# Patient Record
Sex: Female | Born: 1950 | Race: White | Hispanic: No | Smoking: Never smoker
Health system: Southern US, Community
[De-identification: ages and names within clinical notes are randomized; demographics above are authoritative.]

## PROBLEM LIST (undated history)

## (undated) DIAGNOSIS — I639 Cerebral infarction, unspecified: Secondary | ICD-10-CM

## (undated) DIAGNOSIS — E785 Hyperlipidemia, unspecified: Secondary | ICD-10-CM

## (undated) DIAGNOSIS — R7303 Prediabetes: Secondary | ICD-10-CM

---

## 2020-06-13 ENCOUNTER — Encounter (HOSPITAL_COMMUNITY): Payer: Self-pay | Admitting: Physician Assistant

## 2020-06-13 ENCOUNTER — Emergency Department (HOSPITAL_COMMUNITY): Payer: Medicare HMO

## 2020-06-13 ENCOUNTER — Other Ambulatory Visit: Payer: Self-pay

## 2020-06-13 ENCOUNTER — Inpatient Hospital Stay (HOSPITAL_COMMUNITY)
Admission: EM | Admit: 2020-06-13 | Discharge: 2020-06-19 | DRG: 071 | Disposition: A | Discharge status: Skilled Nursing Facility | Payer: Medicare HMO | Attending: Family Medicine | Admitting: Family Medicine

## 2020-06-13 DIAGNOSIS — Z20822 Contact with and (suspected) exposure to covid-19: Secondary | ICD-10-CM | POA: Diagnosis present

## 2020-06-13 DIAGNOSIS — R4182 Altered mental status, unspecified: Secondary | ICD-10-CM | POA: Diagnosis present

## 2020-06-13 DIAGNOSIS — G8929 Other chronic pain: Secondary | ICD-10-CM | POA: Diagnosis present

## 2020-06-13 DIAGNOSIS — W19XXXA Unspecified fall, initial encounter: Secondary | ICD-10-CM | POA: Diagnosis present

## 2020-06-13 DIAGNOSIS — I498 Other specified cardiac arrhythmias: Secondary | ICD-10-CM | POA: Diagnosis not present

## 2020-06-13 DIAGNOSIS — I472 Ventricular tachycardia: Secondary | ICD-10-CM | POA: Diagnosis present

## 2020-06-13 DIAGNOSIS — R739 Hyperglycemia, unspecified: Secondary | ICD-10-CM | POA: Diagnosis present

## 2020-06-13 DIAGNOSIS — Z8673 Personal history of transient ischemic attack (TIA), and cerebral infarction without residual deficits: Secondary | ICD-10-CM | POA: Diagnosis not present

## 2020-06-13 DIAGNOSIS — G928 Other toxic encephalopathy: Secondary | ICD-10-CM | POA: Diagnosis present

## 2020-06-13 DIAGNOSIS — W06XXXA Fall from bed, initial encounter: Secondary | ICD-10-CM | POA: Diagnosis present

## 2020-06-13 DIAGNOSIS — H5340 Unspecified visual field defects: Secondary | ICD-10-CM | POA: Diagnosis present

## 2020-06-13 DIAGNOSIS — R7303 Prediabetes: Secondary | ICD-10-CM | POA: Diagnosis present

## 2020-06-13 DIAGNOSIS — E8809 Other disorders of plasma-protein metabolism, not elsewhere classified: Secondary | ICD-10-CM | POA: Diagnosis present

## 2020-06-13 DIAGNOSIS — I63411 Cerebral infarction due to embolism of right middle cerebral artery: Secondary | ICD-10-CM | POA: Diagnosis not present

## 2020-06-13 DIAGNOSIS — R296 Repeated falls: Secondary | ICD-10-CM | POA: Diagnosis present

## 2020-06-13 DIAGNOSIS — R4701 Aphasia: Secondary | ICD-10-CM | POA: Diagnosis present

## 2020-06-13 DIAGNOSIS — G9341 Metabolic encephalopathy: Principal | ICD-10-CM | POA: Diagnosis present

## 2020-06-13 DIAGNOSIS — Z880 Allergy status to penicillin: Secondary | ICD-10-CM

## 2020-06-13 DIAGNOSIS — D696 Thrombocytopenia, unspecified: Secondary | ICD-10-CM | POA: Diagnosis present

## 2020-06-13 DIAGNOSIS — I639 Cerebral infarction, unspecified: Secondary | ICD-10-CM | POA: Diagnosis present

## 2020-06-13 DIAGNOSIS — Z882 Allergy status to sulfonamides status: Secondary | ICD-10-CM

## 2020-06-13 DIAGNOSIS — R410 Disorientation, unspecified: Secondary | ICD-10-CM

## 2020-06-13 DIAGNOSIS — I10 Essential (primary) hypertension: Secondary | ICD-10-CM | POA: Diagnosis present

## 2020-06-13 DIAGNOSIS — R2242 Localized swelling, mass and lump, left lower limb: Secondary | ICD-10-CM | POA: Diagnosis not present

## 2020-06-13 DIAGNOSIS — Z7982 Long term (current) use of aspirin: Secondary | ICD-10-CM | POA: Diagnosis not present

## 2020-06-13 DIAGNOSIS — I4892 Unspecified atrial flutter: Secondary | ICD-10-CM | POA: Diagnosis present

## 2020-06-13 DIAGNOSIS — M79605 Pain in left leg: Secondary | ICD-10-CM | POA: Diagnosis not present

## 2020-06-13 DIAGNOSIS — I248 Other forms of acute ischemic heart disease: Secondary | ICD-10-CM | POA: Diagnosis present

## 2020-06-13 DIAGNOSIS — E785 Hyperlipidemia, unspecified: Secondary | ICD-10-CM | POA: Diagnosis present

## 2020-06-13 DIAGNOSIS — N390 Urinary tract infection, site not specified: Secondary | ICD-10-CM

## 2020-06-13 HISTORY — DX: Prediabetes: R73.03

## 2020-06-13 HISTORY — DX: Cerebral infarction, unspecified: I63.9

## 2020-06-13 HISTORY — DX: Hyperlipidemia, unspecified: E78.5

## 2020-06-13 LAB — CBC WITH DIFFERENTIAL/PLATELET
Abs Immature Granulocytes: 0.06 10*3/uL (ref 0.00–0.07)
Basophils Absolute: 0 10*3/uL (ref 0.0–0.1)
Basophils Relative: 0 %
Eosinophils Absolute: 0 10*3/uL (ref 0.0–0.5)
Eosinophils Relative: 0 %
HCT: 38.5 % (ref 36.0–46.0)
Hemoglobin: 12 g/dL (ref 12.0–15.0)
Immature Granulocytes: 1 %
Lymphocytes Relative: 6 %
Lymphs Abs: 0.7 10*3/uL (ref 0.7–4.0)
MCH: 27.6 pg (ref 26.0–34.0)
MCHC: 31.2 g/dL (ref 30.0–36.0)
MCV: 88.7 fL (ref 80.0–100.0)
Monocytes Absolute: 0.6 10*3/uL (ref 0.1–1.0)
Monocytes Relative: 5 %
Neutro Abs: 10.4 10*3/uL — ABNORMAL HIGH (ref 1.7–7.7)
Neutrophils Relative %: 88 %
Platelets: 49 10*3/uL — ABNORMAL LOW (ref 150–400)
RBC: 4.34 MIL/uL (ref 3.87–5.11)
RDW: 14.1 % (ref 11.5–15.5)
WBC: 11.8 10*3/uL — ABNORMAL HIGH (ref 4.0–10.5)
nRBC: 0 % (ref 0.0–0.2)

## 2020-06-13 LAB — I-STAT CHEM 8, ED
BUN: 25 mg/dL — ABNORMAL HIGH (ref 8–23)
Calcium, Ion: 1.02 mmol/L — ABNORMAL LOW (ref 1.15–1.40)
Chloride: 111 mmol/L (ref 98–111)
Creatinine, Ser: 0.8 mg/dL (ref 0.44–1.00)
Glucose, Bld: 138 mg/dL — ABNORMAL HIGH (ref 70–99)
HCT: 33 % — ABNORMAL LOW (ref 36.0–46.0)
Hemoglobin: 11.2 g/dL — ABNORMAL LOW (ref 12.0–15.0)
Potassium: 4.1 mmol/L (ref 3.5–5.1)
Sodium: 140 mmol/L (ref 135–145)
TCO2: 20 mmol/L — ABNORMAL LOW (ref 22–32)

## 2020-06-13 LAB — ETHANOL: Alcohol, Ethyl (B): <10 mg/dL (ref <10)

## 2020-06-13 LAB — URINALYSIS, ROUTINE W REFLEX MICROSCOPIC
Bilirubin Urine: NEGATIVE
Glucose, UA: NEGATIVE mg/dL
Ketones, ur: 5 mg/dL — AB
Nitrite: NEGATIVE
Protein, ur: 100 mg/dL — AB
Specific Gravity, Urine: 1.017 (ref 1.005–1.030)
WBC, UA: >50 WBC/hpf — ABNORMAL HIGH (ref 0–5)
pH: 5 (ref 5.0–8.0)

## 2020-06-13 LAB — COMPREHENSIVE METABOLIC PANEL
ALT: 15 U/L (ref 0–44)
AST: 24 U/L (ref 15–41)
Albumin: 3.4 g/dL — ABNORMAL LOW (ref 3.5–5.0)
Alkaline Phosphatase: 49 U/L (ref 38–126)
Anion gap: 11 (ref 5–15)
BUN: 21 mg/dL (ref 8–23)
CO2: 20 mmol/L — ABNORMAL LOW (ref 22–32)
Calcium: 8.8 mg/dL — ABNORMAL LOW (ref 8.9–10.3)
Chloride: 105 mmol/L (ref 98–111)
Creatinine, Ser: 1.09 mg/dL — ABNORMAL HIGH (ref 0.44–1.00)
GFR, Estimated: 55 mL/min — ABNORMAL LOW (ref >60)
Glucose, Bld: 158 mg/dL — ABNORMAL HIGH (ref 70–99)
Potassium: 4.2 mmol/L (ref 3.5–5.1)
Sodium: 136 mmol/L (ref 135–145)
Total Bilirubin: 1.2 mg/dL (ref 0.3–1.2)
Total Protein: 6.7 g/dL (ref 6.5–8.1)

## 2020-06-13 LAB — LACTIC ACID, PLASMA: Lactic Acid, Venous: 1.3 mmol/L (ref 0.5–1.9)

## 2020-06-13 LAB — RAPID URINE DRUG SCREEN, HOSP PERFORMED
Amphetamines: NOT DETECTED
Barbiturates: NOT DETECTED
Benzodiazepines: NOT DETECTED
Cocaine: NOT DETECTED
Opiates: NOT DETECTED
Tetrahydrocannabinol: NOT DETECTED

## 2020-06-13 LAB — TROPONIN I (HIGH SENSITIVITY)
Troponin I (High Sensitivity): 21 ng/L — ABNORMAL HIGH (ref <18)
Troponin I (High Sensitivity): 21 ng/L — ABNORMAL HIGH (ref <18)

## 2020-06-13 LAB — SALICYLATE LEVEL: Salicylate Lvl: <7 mg/dL — ABNORMAL LOW (ref 7.0–30.0)

## 2020-06-13 LAB — PROTIME-INR
INR: 1 (ref 0.8–1.2)
Prothrombin Time: 13.2 seconds (ref 11.4–15.2)

## 2020-06-13 LAB — AMMONIA: Ammonia: 40 umol/L — ABNORMAL HIGH (ref 9–35)

## 2020-06-13 LAB — ACETAMINOPHEN LEVEL: Acetaminophen (Tylenol), Serum: <10 ug/mL — ABNORMAL LOW (ref 10–30)

## 2020-06-13 LAB — RESP PANEL BY RT-PCR (FLU A&B, COVID) ARPGX2
Influenza A by PCR: NEGATIVE
Influenza B by PCR: NEGATIVE
SARS Coronavirus 2 by RT PCR: NEGATIVE

## 2020-06-13 LAB — CBG MONITORING, ED: Glucose-Capillary: 164 mg/dL — ABNORMAL HIGH (ref 70–99)

## 2020-06-13 MED ORDER — SODIUM CHLORIDE 0.9 % IV SOLN: Freq: Once | INTRAVENOUS | Status: AC

## 2020-06-13 MED ORDER — ENOXAPARIN SODIUM 40 MG/0.4ML ~~LOC~~ SOLN: 40.0000 mg | SUBCUTANEOUS | Status: DC

## 2020-06-13 MED ORDER — ONDANSETRON HCL 4 MG PO TABS: 4.0000 mg | ORAL_TABLET | Freq: Four times a day (QID) | ORAL | Status: DC | PRN

## 2020-06-13 MED ORDER — FOSFOMYCIN TROMETHAMINE 3 G PO PACK
3.0000 g | PACK | Freq: Once | ORAL | Status: AC
Start: 2020-06-13 — End: 2020-06-13
  Administered 2020-06-13: 3 g via ORAL
  Filled 2020-06-13: qty 3

## 2020-06-13 MED ORDER — SODIUM CHLORIDE 0.9 % IV SOLN: INTRAVENOUS | Status: DC

## 2020-06-13 MED ORDER — ACETAMINOPHEN 650 MG RE SUPP: 650.0000 mg | Freq: Four times a day (QID) | RECTAL | Status: DC | PRN

## 2020-06-13 MED ORDER — ACETAMINOPHEN 325 MG PO TABS
650.0000 mg | ORAL_TABLET | Freq: Four times a day (QID) | ORAL | Status: DC | PRN
  Administered 2020-06-14 – 2020-06-19 (×4): 650 mg via ORAL
  Filled 2020-06-13 (×4): qty 2

## 2020-06-13 MED ORDER — ONDANSETRON HCL 4 MG/2ML IJ SOLN: 4.0000 mg | Freq: Four times a day (QID) | INTRAMUSCULAR | Status: DC | PRN

## 2020-06-13 NOTE — ED Notes (Signed)
Patient off the floor, will obtain VS when patient returns.

## 2020-06-13 NOTE — ED Provider Notes (Signed)
[facility] EMERGENCY [facility]MENT Provider Note   CSN: [id number] Arrival date & time: 06/13/20  1408     History Chief Complaint  Patient presents with  . Altered Mental Status    Kristy GandyVirginia Jensen is a 70 y.o. female with unknown past medical history who presents today for evaluation of altered mental status.  There are no outside records for review.  History is primarily obtained from patient's and her son.  Patient's son reports that they moved patient from [place] here.  He notes that over the past few days she has had worsening mobility and increased confusion.  He states that she did have a stroke in 2019.  He states that she fell Thursday morning at 3 or 4 AM and then yesterday reportedly fell out of bed.  He states that over the past few days she has been more confused than usual.  She has also been soiling herself which is not her baseline.  She lives in independent living.  Her primary care doctor is still in [place] and they have an appointment for a new one locally in April.  Primary care doctor in [place] is Dr. Marnee GuarneriBerkovich.  Patient's son thinks that she takes aspirin and cholestyramine after having her gall bladder removed.  He doesn't know any other medical history.    Level 5 caveat for confusion.   HPI     Past Medical History:  Diagnosis Date  . Stroke [facility](HCC)     Patient Active Problem List   Diagnosis Date Noted  . Metabolic encephalopathy 06/13/2020  . CVA (cerebral vascular accident) (HCC) 06/13/2020  . Acute lower UTI 06/13/2020  . Hyperglycemia 06/13/2020    History reviewed. No pertinent surgical history.   OB History   No obstetric history on file.     History reviewed. No pertinent family history.     Home Medications Prior to Admission medications   Not on File    Allergies    Penicillins, Sulfa antibiotics, and Tetanus toxoids  Review of Systems   Review of Systems  Unable to perform ROS: Mental status change     Physical Exam Updated Vital Signs BP (!) 165/82   Pulse 88   Temp (!) 97.5 F (36.4 C) (Oral)   Resp (!) 24   SpO2 100%   Physical Exam Vitals and nursing note reviewed.  Constitutional:      General: She is not in acute distress.    Appearance: She is not diaphoretic.  HENT:     Head: Normocephalic and atraumatic.  Eyes:     General: No scleral icterus.       Right eye: No discharge.        Left eye: No discharge.     Conjunctiva/sclera: Conjunctivae normal.  Cardiovascular:     Rate and Rhythm: Normal rate and regular rhythm.     Pulses: Normal pulses.     Heart sounds: Normal heart sounds.  Pulmonary:     Effort: Pulmonary effort is normal. No respiratory distress.     Breath sounds: No stridor.  Abdominal:     General: There is no distension.     Tenderness: There is no abdominal tenderness. There is no guarding.  Musculoskeletal:        General: No deformity.     Cervical back: Normal range of motion and neck supple. No rigidity or tenderness.     Comments: Patient reports pain in her left knee.  She will not tolerate  attempts at range of motion.  Skin:    General: Skin is warm and dry.  Neurological:     Mental Status: She is alert.     Motor: No abnormal muscle tone.     Comments: Patient is awake.  She is oriented to person and place knowing that she is in the hospital however is not oriented to time.  She is able to move both her arms and legs spontaneously.  Her speech is not slurred.   She is confused and that she will state no I do not have any allergies and then when I mentioned that her son said she is allergic to penicillins and sulfa she is able to tell me that she is allergic to penicillins, sulfa, and tetanus toxoid.    Psychiatric:        Mood and Affect: Mood normal.        Behavior: Behavior normal.     ED Results / Procedures / Treatments   Labs (all labs ordered are listed, but only abnormal results are displayed) Labs Reviewed   COMPREHENSIVE METABOLIC PANEL - Abnormal; Notable for the following components:      Result Value   CO2 20 (*)    Glucose, Bld 158 (*)    Creatinine, Ser 1.09 (*)    Calcium 8.8 (*)    Albumin 3.4 (*)    GFR, Estimated 55 (*)    All other components within normal limits  CBC WITH DIFFERENTIAL/PLATELET - Abnormal; Notable for the following components:   WBC 11.8 (*)    Platelets 49 (*)    Neutro Abs 10.4 (*)    All other components within normal limits  URINALYSIS, ROUTINE W REFLEX MICROSCOPIC - Abnormal; Notable for the following components:   APPearance CLOUDY (*)    Hgb urine dipstick SMALL (*)    Ketones, ur 5 (*)    Protein, ur 100 (*)    Leukocytes,Ua LARGE (*)    WBC, UA >50 (*)    Bacteria, UA MANY (*)    All other components within normal limits  AMMONIA - Abnormal; Notable for the following components:   Ammonia 40 (*)    All other components within normal limits  ACETAMINOPHEN LEVEL - Abnormal; Notable for the following components:   Acetaminophen (Tylenol), Serum <10 (*)    All other components within normal limits  SALICYLATE LEVEL - Abnormal; Notable for the following components:   Salicylate Lvl <7.0 (*)    All other components within normal limits  I-STAT CHEM 8, ED - Abnormal; Notable for the following components:   BUN 25 (*)    Glucose, Bld 138 (*)    Calcium, Ion 1.02 (*)    TCO2 20 (*)    Hemoglobin 11.2 (*)    HCT 33.0 (*)    All other components within normal limits  CBG MONITORING, ED - Abnormal; Notable for the following components:   Glucose-Capillary 164 (*)    All other components within normal limits  TROPONIN I (HIGH SENSITIVITY) - Abnormal; Notable for the following components:   Troponin I (High Sensitivity) 21 (*)    All other components within normal limits  TROPONIN I (HIGH SENSITIVITY) - Abnormal; Notable for the following components:   Troponin I (High Sensitivity) 21 (*)    All other components within normal limits  RESP PANEL BY  RT-PCR (FLU A&B, COVID) ARPGX2  CULTURE, BLOOD (ROUTINE X 2)  CULTURE, BLOOD (ROUTINE X 2)  PROTIME-INR  ETHANOL  RAPID  URINE DRUG SCREEN, HOSP PERFORMED  LACTIC ACID, PLASMA  HIV ANTIBODY (ROUTINE TESTING W REFLEX)  CBC    EKG EKG Interpretation  Date/Time:  Friday June 13 2020 14:19:16 EST Ventricular Rate:  87 PR Interval:    QRS Duration: 82 QT Interval:  360 QTC Calculation: 433 R Axis:   -28 Text Interpretation: Atrial fibrillation vs motrion artifact Borderline left axis deviation Artifact in lead(s) I Confirmed by Alvester Chou [phone]) on 06/13/2020 2:42:56 PM   Radiology DG Chest 2 View  Result Date: 06/13/2020 CLINICAL DATA:  Status post fall. EXAM: CHEST - 2 VIEW COMPARISON:  None. FINDINGS: The heart size and mediastinal contours are within normal limits. No pneumothorax or pleural effusion is noted. Mild bibasilar subsegmental atelectasis or scarring is noted. The visualized skeletal structures are unremarkable. IMPRESSION: Mild bibasilar subsegmental atelectasis or scarring. Electronically Signed   By: Lupita Raider M.D.   On: 06/13/2020 16:42   CT Head Wo Contrast  Result Date: 06/13/2020 CLINICAL DATA:  Head trauma EXAM: CT HEAD WITHOUT CONTRAST CT CERVICAL SPINE WITHOUT CONTRAST TECHNIQUE: Multidetector CT imaging of the head and cervical spine was performed following the standard protocol without intravenous contrast. Multiplanar CT image reconstructions of the cervical spine were also generated. COMPARISON:  None. FINDINGS: CT HEAD FINDINGS Brain: Ventricle size normal. Mild atrophy. Chronic infarct left parietal lobe with encephalomalacia and mild calcification. Negative for acute infarct, hemorrhage, mass. Vascular: Negative for hyperdense vessel Skull: Negative Sinuses/Orbits: Paranasal sinuses clear.  Negative orbit Other: CT CERVICAL SPINE FINDINGS Alignment: Slight anterolisthesis C4-5.  Mild cervical kyphosis. Skull base and vertebrae: Negative for  fracture. Soft tissues and spinal canal: Hypoplastic left thyroid. 2 cm nodule right thyroid. No enlarged lymph nodes in the neck Disc levels: Disc degeneration and spurring C4 through C7. This is most severe at C5-6 with diffuse uncinate spurring as well as a calcified extruded disc fragment extending caudally. There is moderate spinal stenosis. Severe right foraminal encroachment and moderate left foraminal encroachment at C5-6. Upper chest: Lung apices clear bilaterally Other: None IMPRESSION: 1. No acute intracranial abnormality 2. Negative for cervical spine fracture 3. Cervical spine degenerative change with moderate spinal stenosis at C5-6. Electronically Signed   By: Marlan Palau M.D.   On: 06/13/2020 16:01   CT Cervical Spine Wo Contrast  Result Date: 06/13/2020 CLINICAL DATA:  Head trauma EXAM: CT HEAD WITHOUT CONTRAST CT CERVICAL SPINE WITHOUT CONTRAST TECHNIQUE: Multidetector CT imaging of the head and cervical spine was performed following the standard protocol without intravenous contrast. Multiplanar CT image reconstructions of the cervical spine were also generated. COMPARISON:  None. FINDINGS: CT HEAD FINDINGS Brain: Ventricle size normal. Mild atrophy. Chronic infarct left parietal lobe with encephalomalacia and mild calcification. Negative for acute infarct, hemorrhage, mass. Vascular: Negative for hyperdense vessel Skull: Negative Sinuses/Orbits: Paranasal sinuses clear.  Negative orbit Other: CT CERVICAL SPINE FINDINGS Alignment: Slight anterolisthesis C4-5.  Mild cervical kyphosis. Skull base and vertebrae: Negative for fracture. Soft tissues and spinal canal: Hypoplastic left thyroid. 2 cm nodule right thyroid. No enlarged lymph nodes in the neck Disc levels: Disc degeneration and spurring C4 through C7. This is most severe at C5-6 with diffuse uncinate spurring as well as a calcified extruded disc fragment extending caudally. There is moderate spinal stenosis. Severe right foraminal  encroachment and moderate left foraminal encroachment at C5-6. Upper chest: Lung apices clear bilaterally Other: None IMPRESSION: 1. No acute intracranial abnormality 2. Negative for cervical spine fracture 3. Cervical spine degenerative change with  moderate spinal stenosis at C5-6. Electronically Signed   By: Marlan Palau M.D.   On: 06/13/2020 16:01   DG Knee Complete 4 Views Left  Result Date: 06/13/2020 CLINICAL DATA:  Left knee pain after fall several days ago. EXAM: LEFT KNEE - COMPLETE 4+ VIEW COMPARISON:  None. FINDINGS: No evidence of fracture, dislocation, or joint effusion. Mild narrowing of medial and lateral joint spaces are noted with osteophyte formation. Severe narrowing of patellofemoral space is noted. Soft tissues are unremarkable. IMPRESSION: Tricompartmental degenerative joint disease. No acute abnormality seen in the left knee. Electronically Signed   By: Lupita Raider M.D.   On: 06/13/2020 16:43   DG Hip Unilat W or Wo Pelvis 2-3 Views Left  Result Date: 06/13/2020 CLINICAL DATA:  Acute left hip pain after fall several days ago. EXAM: DG HIP (WITH OR WITHOUT PELVIS) 2-3V LEFT COMPARISON:  None. FINDINGS: There is no evidence of hip fracture or dislocation. There is no evidence of arthropathy or other focal bone abnormality. IMPRESSION: Negative. Electronically Signed   By: Lupita Raider M.D.   On: 06/13/2020 16:44    Procedures Procedures   Medications Ordered in ED Medications  0.9 %  sodium chloride infusion ( Intravenous New Bag/Given 06/13/20 2030)  ondansetron (ZOFRAN) tablet 4 mg (has no administration in time range)    Or  ondansetron (ZOFRAN) injection 4 mg (has no administration in time range)  acetaminophen (TYLENOL) tablet 650 mg (has no administration in time range)    Or  acetaminophen (TYLENOL) suppository 650 mg (has no administration in time range)  fosfomycin (MONUROL) packet 3 g (3 g Oral Given 06/13/20 2014)  0.9 %  sodium chloride infusion (  Intravenous New Bag/Given 06/13/20 2018)    ED Course  I have reviewed the triage vital signs and the nursing notes.  Pertinent labs & imaging results that were available during my care of the patient were reviewed by me and considered in my medical decision making (see chart for details).  Clinical Course as of 06/13/20 2111  Fri Jun 13, 2020  1434 Called CT to expedite scans [EH]  1438 Son:  Sorci [EH]  1761 ciearra rufo [phone] [EH]  1454 Stroke in 2019, cognitive impairment, Moved in October. Fell Thursday at 3/4am and yesterday fell out of bed. [place] Has been soiling her self,  [EH]  1459 Aspirin, Fartiga?  Berkovich  [phone] [EH]  1757 Urine [EH]    Clinical Course User Index [EH] Norman Clay   MDM Rules/Calculators/A&P                         Patient is a 70 year old woman who presents today for evaluation of confusion for the past few days. Story is limited as there are no outside records available for review, patient does not know the medicines that she is taking for her history as she is confused and her son does not know these either.  Does report though that over the past few days her mental status has been worsening and she has been soiling herself which is abnormal for her.  Patient has been reportedly falling twice yesterday.  She is supposed to be taking Eliquis, tells me she has not taken it recently.  She had CT head and neck obtained without evidence of intracranial hemorrhage or other acute.  X-rays of the knee was obtained showing arthritis and son reports that her knee pain is chronic.  Hip x-rays were also obtained without evidence of fracture or other acute abnormality.  CBC shows mild leukocytosis at 11.8, mild thrombocytopenia with platelets of 49.  CMP shows glucose is slightly elevated at 158, creatinine is 1.09.  Her ammonia is slightly elevated.  She does take cholestyramine according to her son which he attributes to her  taking for having her gallbladder out.  Her urine does show 11-20 red cells, over 50 white cells with many bacteria.  Patient has a unknown reaction to penicillins and sulfa drugs.  I spoke with pharmacy who recommended either fosfomycin or Rocephin.  Given that I do not know what patient's allergic reaction was to antibiotics or when it was decision made for fosfomycin.  As patient lives in independent living and is confused and unable to care for herself at this point she will require admission into the hospital.  I spoke with Hospitalist who will see patient for admission.    Final Clinical Impression(s) / ED Diagnoses Final diagnoses:  Confusion  Lower urinary tract infectious disease    Rx / DC Orders ED Discharge Orders    None       Norman Clay 06/13/20 2111    Terald Sleeper, MD 06/14/20 [phone]

## 2020-06-13 NOTE — ED Notes (Signed)
ED PA requesting pt to get CT scan as soon as possible per order. This nurse notified CT of provider request.

## 2020-06-13 NOTE — ED Notes (Signed)
This RN notified ED pharmacy to contact pt son to update pt Med rec

## 2020-06-13 NOTE — H&P (Signed)
History and Physical   [place] YQI:[id number] DOB: Jul 31, 1950 DOA: 06/13/2020  Referring MD/NP/PA: Lyndel Safe, PA  PCP: Patient, No Pcp Per   Outpatient Specialists: None  Patient coming from: Independent living facility  Chief Complaint: Altered mental status  HPI: [place] is a 70 y.o. female with medical history significant of CVA but otherwise no significant other known history who lives independently per family brought in secondary to altered mental status.  Patient's son is primary historian.  She apparently moved from [place] recently.  He moved her here because of increasing confusion.  He has no clear history of patient's medical problems and is not aware of the medications she takes.  Patient is able to say that she had a stroke in 2019.  She fell a few days ago and yesterday but reportedly fell again this time out of bed.  She has also been swelling herself.  Patient has no local primary caregiver.  She is therefore not able to give [place] adequate history regarding her current medical situation.  Patient being admitted to the hospital for further evaluation and treatment.  Father son she has also decreased oral intake.  Patient evaluated in the ER and was found to have UTI.  Suspicion for toxic metabolic encephalopathy therefore is made.  She is being admitted to the hospital for evaluation and treatment.  No prior history of dementia.  No head injuries..  ED Course: Temperature 98.1 blood pressure 165/82 pulse 80 respirate 26 oxygen sats 96% on room air.  White count is 11.8 hemoglobin 11.1 platelets of 49.  Sodium 140 potassium 4.0 chloride 111 CO2 20 BUN 25 creatinine 0.80 and calcium 8.8.  Glucose 131 INR 1.0 lactic acid 1.3.  Head CT without contrast is negative.  COVID-19 screen is negative.  Troponin is 21 and flat.  CT C-spine is also negative.  Chest x-ray and hip x-rays are at this point negative.  Patient is therefore being admitted based on urinalysis which  shows WBC more than 50 many bacteria RBC 11-20 and large leukocytes.  It is also cloudy urine.  Appears to have UTI.  Review of Systems: As per HPI otherwise 10 point review of systems negative.    Past Medical History:  Diagnosis Date  . Stroke [facility])     History reviewed. No pertinent surgical history.   has no history on file for tobacco use, alcohol use, and drug use.  Allergies  Allergen Reactions  . Penicillins   . Sulfa Antibiotics   . Tetanus Toxoids     History reviewed. No pertinent family history.   Prior to Admission medications   Not on File    Physical Exam: Vitals:   06/13/20 1500 06/13/20 1530 06/13/20 2206 06/14/20 0034  BP: (!) 145/79 (!) 165/82 129/65 124/74  Pulse: 87 88 87 82  Resp: 19 (!) 24 (!) 21 (!) 21  Temp:    98.1 F (36.7 C)  TempSrc:    Oral  SpO2: 98% 100% 100% 97%      Constitutional: Mildly confused no distress Vitals:   06/13/20 1500 06/13/20 1530 06/13/20 2206 06/14/20 0034  BP: (!) 145/79 (!) 165/82 129/65 124/74  Pulse: 87 88 87 82  Resp: 19 (!) 24 (!) 21 (!) 21  Temp:    98.1 F (36.7 C)  TempSrc:    Oral  SpO2: 98% 100% 100% 97%   Eyes: PERRL, lids and conjunctivae normal ENMT: Mucous membranes are moist. Posterior pharynx clear of any exudate or  lesions.Normal dentition.  Neck: normal, supple, no masses, no thyromegaly Respiratory: clear to auscultation bilaterally, no wheezing, no crackles. Normal respiratory effort. No accessory muscle use.  Cardiovascular: Regular rate and rhythm, no murmurs / rubs / gallops. No extremity edema. 2+ pedal pulses. No carotid bruits.  Abdomen: no tenderness, no masses palpated. No hepatosplenomegaly. Bowel sounds positive.  Musculoskeletal: no clubbing / cyanosis. No joint deformity upper and lower extremities. Good ROM, no contractures. Normal muscle tone.  Skin: no rashes, lesions, ulcers. No induration Neurologic: CN 2-12 grossly intact. Sensation intact, DTR normal. Strength 5/5  in all 4.  Psychiatric: Confused but pleasant not agitated.     Labs on Admission: I have personally reviewed following labs and imaging studies  CBC: Recent Labs  Lab 06/13/20 1431 06/13/20 1506  WBC 11.8*  --   NEUTROABS 10.4*  --   HGB 12.0 11.2*  HCT 38.5 33.0*  MCV 88.7  --   PLT 49*  --    Basic Metabolic Panel: Recent Labs  Lab 06/13/20 1431 06/13/20 1506  NA 136 140  K 4.2 4.1  CL 105 111  CO2 20*  --   GLUCOSE 158* 138*  BUN 21 25*  CREATININE 1.09* 0.80  CALCIUM 8.8*  --    GFR: CrCl cannot be calculated (Unknown ideal weight.). Liver Function Tests: Recent Labs  Lab 06/13/20 1431  AST 24  ALT 15  ALKPHOS 49  BILITOT 1.2  PROT 6.7  ALBUMIN 3.4*   No results for input(s): LIPASE, AMYLASE in the last 168 hours. Recent Labs  Lab 06/13/20 1432  AMMONIA 40*   Coagulation Profile: Recent Labs  Lab 06/13/20 1431  INR 1.0   Cardiac Enzymes: No results for input(s): CKTOTAL, CKMB, CKMBINDEX, TROPONINI in the last 168 hours. BNP (last 3 results) No results for input(s): PROBNP in the last 8760 hours. HbA1C: No results for input(s): HGBA1C in the last 72 hours. CBG: Recent Labs  Lab 06/13/20 1459  GLUCAP 164*   Lipid Profile: No results for input(s): CHOL, HDL, LDLCALC, TRIG, CHOLHDL, LDLDIRECT in the last 72 hours. Thyroid Function Tests: No results for input(s): TSH, T4TOTAL, FREET4, T3FREE, THYROIDAB in the last 72 hours. Anemia Panel: No results for input(s): VITAMINB12, FOLATE, FERRITIN, TIBC, IRON, RETICCTPCT in the last 72 hours. Urine analysis:    Component Value Date/Time   COLORURINE YELLOW 06/13/2020 1618   APPEARANCEUR CLOUDY (A) 06/13/2020 1618   LABSPEC 1.017 06/13/2020 1618   PHURINE 5.0 06/13/2020 1618   GLUCOSEU NEGATIVE 06/13/2020 1618   HGBUR SMALL (A) 06/13/2020 1618   BILIRUBINUR NEGATIVE 06/13/2020 1618   KETONESUR 5 (A) 06/13/2020 1618   PROTEINUR 100 (A) 06/13/2020 1618   NITRITE NEGATIVE 06/13/2020 1618    LEUKOCYTESUR LARGE (A) 06/13/2020 1618   Sepsis Labs: @LABRCNTIP (procalcitonin:4,lacticidven:4) ) Recent Results (from the past 240 hour(s))  Resp Panel by RT-PCR (Flu A&B, Covid) Nasopharyngeal Swab     Status: None   Collection Time: 06/13/20  8:20 PM   Specimen: Nasopharyngeal Swab; Nasopharyngeal(NP) swabs in vial transport medium  Result Value Ref Range Status   SARS Coronavirus 2 by RT PCR NEGATIVE NEGATIVE Final    Comment: (NOTE) SARS-CoV-2 target nucleic acids are NOT DETECTED.  The SARS-CoV-2 RNA is generally detectable in upper respiratory specimens during the acute phase of infection. The lowest concentration of SARS-CoV-2 viral copies this assay can detect is 138 copies/mL. A negative result does not preclude SARS-Cov-2 infection and should not be used as the sole basis for treatment  or other patient management decisions. A negative result may occur with  improper specimen collection/handling, submission of specimen other than nasopharyngeal swab, presence of viral mutation(s) within the areas targeted by this assay, and inadequate number of viral copies(<138 copies/mL). A negative result must be combined with clinical observations, patient history, and epidemiological information. The expected result is Negative.  Fact Sheet for Patients:  BloggerCourse.com  Fact Sheet for Healthcare Providers:  SeriousBroker.it  This test is no t yet approved or cleared by the [place] FDA and  has been authorized for detection and/or diagnosis of SARS-CoV-2 by FDA under an Emergency Use Authorization (EUA). This EUA will remain  in effect (meaning this test can be used) for the duration of the COVID-19 declaration under Section 564(b)(1) of the Act, 21 U.S.C.section 360bbb-3(b)(1), unless the authorization is terminated  or revoked sooner.       Influenza A by PCR NEGATIVE NEGATIVE Final   Influenza B by PCR NEGATIVE  NEGATIVE Final    Comment: (NOTE) The Xpert Xpress SARS-CoV-2/FLU/RSV plus assay is intended as an aid in the diagnosis of influenza from Nasopharyngeal swab specimens and should not be used as a sole basis for treatment. Nasal washings and aspirates are unacceptable for Xpert Xpress SARS-CoV-2/FLU/RSV testing.  Fact Sheet for Patients: BloggerCourse.com  Fact Sheet for Healthcare Providers: SeriousBroker.it  This test is not yet approved or cleared by the [place] FDA and has been authorized for detection and/or diagnosis of SARS-CoV-2 by FDA under an Emergency Use Authorization (EUA). This EUA will remain in effect (meaning this test can be used) for the duration of the COVID-19 declaration under Section 564(b)(1) of the Act, 21 U.S.C. section 360bbb-3(b)(1), unless the authorization is terminated or revoked.  Performed at [facility], [address], [place], [place] [address]      Radiological Exams on Admission: DG Chest 2 View  Result Date: 06/13/2020 CLINICAL DATA:  Status post fall. EXAM: CHEST - 2 VIEW COMPARISON:  None. FINDINGS: The heart size and mediastinal contours are within normal limits. No pneumothorax or pleural effusion is noted. Mild bibasilar subsegmental atelectasis or scarring is noted. The visualized skeletal structures are unremarkable. IMPRESSION: Mild bibasilar subsegmental atelectasis or scarring. Electronically Signed   By: Lupita Raider M.D.   On: 06/13/2020 16:42   CT Head Wo Contrast  Result Date: 06/13/2020 CLINICAL DATA:  Head trauma EXAM: CT HEAD WITHOUT CONTRAST CT CERVICAL SPINE WITHOUT CONTRAST TECHNIQUE: Multidetector CT imaging of the head and cervical spine was performed following the standard protocol without intravenous contrast. Multiplanar CT image reconstructions of the cervical spine were also generated. COMPARISON:  None. FINDINGS: CT HEAD FINDINGS Brain: Ventricle size  normal. Mild atrophy. Chronic infarct left parietal lobe with encephalomalacia and mild calcification. Negative for acute infarct, hemorrhage, mass. Vascular: Negative for hyperdense vessel Skull: Negative Sinuses/Orbits: Paranasal sinuses clear.  Negative orbit Other: CT CERVICAL SPINE FINDINGS Alignment: Slight anterolisthesis C4-5.  Mild cervical kyphosis. Skull base and vertebrae: Negative for fracture. Soft tissues and spinal canal: Hypoplastic left thyroid. 2 cm nodule right thyroid. No enlarged lymph nodes in the neck Disc levels: Disc degeneration and spurring C4 through C7. This is most severe at C5-6 with diffuse uncinate spurring as well as a calcified extruded disc fragment extending caudally. There is moderate spinal stenosis. Severe right foraminal encroachment and moderate left foraminal encroachment at C5-6. Upper chest: Lung apices clear bilaterally Other: None IMPRESSION: 1. No acute intracranial abnormality 2. Negative for cervical spine fracture  3. Cervical spine degenerative change with moderate spinal stenosis at C5-6. Electronically Signed   By: Marlan Palauharles  Clark M.D.   On: 06/13/2020 16:01   CT Cervical Spine Wo Contrast  Result Date: 06/13/2020 CLINICAL DATA:  Head trauma EXAM: CT HEAD WITHOUT CONTRAST CT CERVICAL SPINE WITHOUT CONTRAST TECHNIQUE: Multidetector CT imaging of the head and cervical spine was performed following the standard protocol without intravenous contrast. Multiplanar CT image reconstructions of the cervical spine were also generated. COMPARISON:  None. FINDINGS: CT HEAD FINDINGS Brain: Ventricle size normal. Mild atrophy. Chronic infarct left parietal lobe with encephalomalacia and mild calcification. Negative for acute infarct, hemorrhage, mass. Vascular: Negative for hyperdense vessel Skull: Negative Sinuses/Orbits: Paranasal sinuses clear.  Negative orbit Other: CT CERVICAL SPINE FINDINGS Alignment: Slight anterolisthesis C4-5.  Mild cervical kyphosis. Skull base  and vertebrae: Negative for fracture. Soft tissues and spinal canal: Hypoplastic left thyroid. 2 cm nodule right thyroid. No enlarged lymph nodes in the neck Disc levels: Disc degeneration and spurring C4 through C7. This is most severe at C5-6 with diffuse uncinate spurring as well as a calcified extruded disc fragment extending caudally. There is moderate spinal stenosis. Severe right foraminal encroachment and moderate left foraminal encroachment at C5-6. Upper chest: Lung apices clear bilaterally Other: None IMPRESSION: 1. No acute intracranial abnormality 2. Negative for cervical spine fracture 3. Cervical spine degenerative change with moderate spinal stenosis at C5-6. Electronically Signed   By: Marlan Palauharles  Clark M.D.   On: 06/13/2020 16:01   DG Knee Complete 4 Views Left  Result Date: 06/13/2020 CLINICAL DATA:  Left knee pain after fall several days ago. EXAM: LEFT KNEE - COMPLETE 4+ VIEW COMPARISON:  None. FINDINGS: No evidence of fracture, dislocation, or joint effusion. Mild narrowing of medial and lateral joint spaces are noted with osteophyte formation. Severe narrowing of patellofemoral space is noted. Soft tissues are unremarkable. IMPRESSION: Tricompartmental degenerative joint disease. No acute abnormality seen in the left knee. Electronically Signed   By: Lupita RaiderJames  Green Jr M.D.   On: 06/13/2020 16:43   DG Hip Unilat W or Wo Pelvis 2-3 Views Left  Result Date: 06/13/2020 CLINICAL DATA:  Acute left hip pain after fall several days ago. EXAM: DG HIP (WITH OR WITHOUT PELVIS) 2-3V LEFT COMPARISON:  None. FINDINGS: There is no evidence of hip fracture or dislocation. There is no evidence of arthropathy or other focal bone abnormality. IMPRESSION: Negative. Electronically Signed   By: Lupita RaiderJames  Green Jr M.D.   On: 06/13/2020 16:44    EKG: Independently reviewed.  Normal sinus rhythm with no significant changes  Assessment/Plan Principal Problem:   Metabolic encephalopathy Active Problems:   CVA  (cerebral vascular accident) (HCC)   Acute lower UTI   Hyperglycemia     #1 toxic metabolic encephalopathy: Suspected secondary to UTI.  Patient may also have underlying dementia gradually setting need as symptoms have been gradual.  We will admit the patient.  Close monitoring.  Treat the UTI.  Observe her mental status.  If she continues to be confused may consider MRI of the brain prior to discharge.  #2 history of CVA: Not sure what patient is taking.  With this history however MRI is likely good option.  Also may need to place patient on at least aspirin and statin unless she is allergic.  #3 UTI: Patient reported history of penicillin allergies.  Not sure what it is.  Because of that she was given fosfomycin in the ER.  Continue to follow urine culture and blood  culture results.  #4 hyperglycemia: Noted hyperglycemia with no prior history of diabetes as far as we know.  Will check hemoglobin A1c and follow.   DVT prophylaxis: Lovenox Code Status: Full code Family Communication: Patient's son Disposition Plan: To be determined Consults called: None at this point by neurology called by ER Admission status: Inpatient  Severity of Illness: The appropriate patient status for this patient is INPATIENT. Inpatient status is judged to be reasonable and necessary in order to provide the required intensity of service to ensure the patient's safety. The patient's presenting symptoms, physical exam findings, and initial radiographic and laboratory data in the context of their chronic comorbidities is felt to place them at high risk for further clinical deterioration. Furthermore, it is not anticipated that the patient will be medically stable for discharge from the hospital within 2 midnights of admission. The following factors support the patient status of inpatient.   " The patient's presenting symptoms include confusion. " The worrisome physical exam findings include mild confusion. " The  initial radiographic and laboratory data are worrisome because of evidence of UTI. " The chronic co-morbidities include history of stroke.   * I certify that at the point of admission it is my clinical judgment that the patient will require inpatient hospital care spanning beyond 2 midnights from the point of admission due to high intensity of service, high risk for further deterioration and high frequency of surveillance required.Lonia Blood MD [facility] Pager [phone]  If 7PM-7AM, please contact night-coverage [email or web address] Password [facility]  06/14/2020, 2:55 AM

## 2020-06-13 NOTE — ED Triage Notes (Signed)
Patient BIB [facility] from [facility]. Per facility staff, patient was yelling out for help. Per facility patient had soiled herself and was confused, patient had no recollection of soiling herself. Patient also fell twice yesterday, noted some bruising to head. Patient in on eliquis. VSS.

## 2020-06-14 ENCOUNTER — Inpatient Hospital Stay (HOSPITAL_COMMUNITY): Payer: Medicare HMO

## 2020-06-14 ENCOUNTER — Encounter (HOSPITAL_COMMUNITY): Payer: Self-pay | Admitting: Internal Medicine

## 2020-06-14 DIAGNOSIS — R2242 Localized swelling, mass and lump, left lower limb: Secondary | ICD-10-CM

## 2020-06-14 DIAGNOSIS — M79605 Pain in left leg: Secondary | ICD-10-CM | POA: Diagnosis not present

## 2020-06-14 DIAGNOSIS — R4182 Altered mental status, unspecified: Secondary | ICD-10-CM | POA: Diagnosis present

## 2020-06-14 LAB — COMPREHENSIVE METABOLIC PANEL
ALT: 15 U/L (ref 0–44)
AST: 15 U/L (ref 15–41)
Albumin: 3.1 g/dL — ABNORMAL LOW (ref 3.5–5.0)
Alkaline Phosphatase: 46 U/L (ref 38–126)
Anion gap: 12 (ref 5–15)
BUN: 19 mg/dL (ref 8–23)
CO2: 19 mmol/L — ABNORMAL LOW (ref 22–32)
Calcium: 9.1 mg/dL (ref 8.9–10.3)
Chloride: 105 mmol/L (ref 98–111)
Creatinine, Ser: 1.1 mg/dL — ABNORMAL HIGH (ref 0.44–1.00)
GFR, Estimated: 54 mL/min — ABNORMAL LOW (ref >60)
Glucose, Bld: 134 mg/dL — ABNORMAL HIGH (ref 70–99)
Potassium: 3.7 mmol/L (ref 3.5–5.1)
Sodium: 136 mmol/L (ref 135–145)
Total Bilirubin: 1 mg/dL (ref 0.3–1.2)
Total Protein: 6.4 g/dL — ABNORMAL LOW (ref 6.5–8.1)

## 2020-06-14 LAB — CBC
HCT: 35.8 % — ABNORMAL LOW (ref 36.0–46.0)
Hemoglobin: 11.8 g/dL — ABNORMAL LOW (ref 12.0–15.0)
MCH: 28.4 pg (ref 26.0–34.0)
MCHC: 33 g/dL (ref 30.0–36.0)
MCV: 86.1 fL (ref 80.0–100.0)
Platelets: 266 10*3/uL (ref 150–400)
RBC: 4.16 MIL/uL (ref 3.87–5.11)
RDW: 14.3 % (ref 11.5–15.5)
WBC: 10.3 10*3/uL (ref 4.0–10.5)
nRBC: 0 % (ref 0.0–0.2)

## 2020-06-14 LAB — CBG MONITORING, ED
Glucose-Capillary: 152 mg/dL — ABNORMAL HIGH (ref 70–99)
Glucose-Capillary: 100 mg/dL — ABNORMAL HIGH (ref 70–99)
Glucose-Capillary: 88 mg/dL (ref 70–99)
Glucose-Capillary: 173 mg/dL — ABNORMAL HIGH (ref 70–99)
Glucose-Capillary: 143 mg/dL — ABNORMAL HIGH (ref 70–99)

## 2020-06-14 LAB — HEMOGLOBIN A1C
Hgb A1c MFr Bld: 6 % — ABNORMAL HIGH (ref 4.8–5.6)
Mean Plasma Glucose: 125.5 mg/dL

## 2020-06-14 LAB — TROPONIN I (HIGH SENSITIVITY): Troponin I (High Sensitivity): 24 ng/L — ABNORMAL HIGH (ref <18)

## 2020-06-14 LAB — HIV ANTIBODY (ROUTINE TESTING W REFLEX): HIV Screen 4th Generation wRfx: NONREACTIVE

## 2020-06-14 MED ORDER — POTASSIUM CHLORIDE CRYS ER 20 MEQ PO TBCR
20.0000 meq | EXTENDED_RELEASE_TABLET | Freq: Once | ORAL | Status: AC
  Administered 2020-06-14: 20 meq via ORAL
  Filled 2020-06-14: qty 1

## 2020-06-14 MED ORDER — ALBUMIN HUMAN 5 % IV SOLN
12.5000 g | Freq: Once | INTRAVENOUS | Status: AC
  Administered 2020-06-14: 12.5 g via INTRAVENOUS
  Filled 2020-06-14: qty 250

## 2020-06-14 MED ORDER — INSULIN ASPART 100 UNIT/ML ~~LOC~~ SOLN
0.0000 [IU] | Freq: Three times a day (TID) | SUBCUTANEOUS | Status: DC
  Administered 2020-06-14 – 2020-06-16 (×2): 2 [IU] via SUBCUTANEOUS

## 2020-06-14 MED ORDER — CALCIUM GLUCONATE-NACL 1-0.675 GM/50ML-% IV SOLN
1.0000 g | Freq: Once | INTRAVENOUS | Status: AC
  Administered 2020-06-14: 1000 mg via INTRAVENOUS
  Filled 2020-06-14: qty 50

## 2020-06-14 MED ORDER — FOSFOMYCIN TROMETHAMINE 3 G PO PACK: 3.0000 g | PACK | Freq: Once | ORAL | Status: DC

## 2020-06-14 MED ORDER — AMLODIPINE BESYLATE 5 MG PO TABS
5.0000 mg | ORAL_TABLET | Freq: Every day | ORAL | Status: DC
  Administered 2020-06-14 – 2020-06-19 (×6): 5 mg via ORAL
  Filled 2020-06-14 (×6): qty 1

## 2020-06-14 MED ORDER — MAGNESIUM SULFATE IN D5W 1-5 GM/100ML-% IV SOLN
1.0000 g | Freq: Once | INTRAVENOUS | Status: AC
  Administered 2020-06-14: 1 g via INTRAVENOUS
  Filled 2020-06-14: qty 100

## 2020-06-14 MED ORDER — HYDRALAZINE HCL 20 MG/ML IJ SOLN
10.0000 mg | INTRAMUSCULAR | Status: DC | PRN
  Administered 2020-06-16 – 2020-06-17 (×2): 10 mg via INTRAVENOUS
  Filled 2020-06-14 (×2): qty 1

## 2020-06-14 MED ORDER — ASPIRIN EC 81 MG PO TBEC
81.0000 mg | DELAYED_RELEASE_TABLET | Freq: Every day | ORAL | Status: DC
  Administered 2020-06-14 – 2020-06-19 (×6): 81 mg via ORAL
  Filled 2020-06-14 (×6): qty 1

## 2020-06-14 NOTE — ED Notes (Signed)
hospitalist was paged to report run of V-tach, pt watching tv, no complaints, vss

## 2020-06-14 NOTE — ED Notes (Signed)
Per MRI, patient has a loop recorder but we don't have a make/model to determine if it is MRI compatible. Called placed to son without response.

## 2020-06-14 NOTE — Progress Notes (Addendum)
PROGRESS NOTE    Patient: Kristy Jensen                            PCP: Patient, No Pcp Per                    DOB: 07-Dec-1950            DOA: 06/13/2020 [id number]             DOS: 06/14/2020, 6:45 AM   LOS: 1 day   Date of Service: The patient was seen and examined on 06/14/2020  PCP in [place] Dr. Marnee Guarneri [phone] (provided by her son)  Subjective:   The patient was seen and examined this morning. Stable at this time. Still complaining of left leg pain.  Otherwise no issues overnight .  Brief Narrative:   Kristy Jensen is a 70 y.o.f with PMH/o   CVA but otherwise no significant other known history who lives independently per family brought in secondary to altered mental status.   Patient's son is primary historian.  She apparently moved from [place] recently.  He moved her here because of increasing confusion.  He has no clear history of patient's medical problems and is not aware of the medications she takes.  Patient is able to say that she had a stroke in 2019.  She fell a few days ago and yesterday but reportedly fell again this time out of bed.  She has also been swelling herself.  Patient has no local primary caregiver.  She is therefore not able to give [place] adequate history regarding her current medical situation.    Patient being admitted to the hospital for further evaluation and treatment.  Father son she has also decreased oral intake.  Patient evaluated in the ER and was found to have UTI.   Suspicion for toxic metabolic encephalopathy therefore is made.    No prior history of dementia.  No head injuries..  ED Course: vitals and LABs were with in normal limits.  Head CT without contrast is negative.  COVID-19 screen is negative.  Troponin is 21 and flat.  CT C-spine is also negative.  Chest x-ray and hip x-rays are at this point negative.   Patient is therefore being admitted based on urinalysis which shows WBC more than 50 many bacteria RBC 11-20 and large  leukocytes.  It is also cloudy urine.   Appeared to have UTI.   Assessment & Plan:   Principal Problem:   Metabolic encephalopathy Active Problems:   CVA (cerebral vascular accident) (HCC)   Acute lower UTI   Hyperglycemia   Toxic metabolic encephalopathy: -Continue to monitor, neurochecks -Hemodynamically stable -Alert and oriented x2,  -May be complicated by urine tract infection -no sign of sepsis  (lactic acid 1.3, WBC 10.3, afebrile normotensive) -Possible underlying dementia-gradually getting worse complicated by UTI -Further evaluation once patient is more awake -Imaging of the head thus far within normal limits -Unable to obtain MRI -patient seems to have a loop recorder in place -We will continue treating underlying UTI -Follow-up with labs including TSH, B12, ammonia level -Urine drug screen negative .  History of CVA:  -Apparently she is not taking any medication -We will initiate aspirin, evaluate if she can tolerate statins -We will check a fasting lipid panel -MRI-pending due to loop recorder in place (checking compatibility) Called her PCP in [place] left a message at Dr. Marnee Guarneri [phone] (provided by her son)   Acute cystitis -Apparently patient has a penicillin allergy -not clear  -Patient has been initiated on antibiotics fosfomycin -We will follow with urine and blood cultures   ?Thrombocytopenia -On admission 49 this morning 266  Hypoalbuminemia, hypocalcemia -We will monitorand  replete accordingly  Hyperglycemia:  CBG: 164, 138, 134 Noted hyperglycemia with no prior history of diabetes as far as we know.   Hemoglobin A1c: 6.0  Hypertension -We see no history of hypertension, not any medication -Blood pressure remains to be high this morning 179/78, - added Norvasc 5 mg Q daily  -Anticipating initiating BP meds we will monitor closely -As needed IV hydralazine  Elevated troponin -Remain flat at 21,  -Denies any chest  pain, no EKG changes   Left leg pain/hip pain -X-ray negative -We will obtain ultrasound - As needed analgesics -Consulting PT for evaluation   ------------------------------------------------------------------------------------------------------------------------------------ Nutritional status:  The patient's BMI is: There is no height or weight on file to calculate BMI. I agree with the assessment and plan as outlined .Marland Kitchen  ----------------------------------------------------------------------------------------------------------------------------------- Cultures; Blood Cultures x 2 >> NGT Urine Culture  >>> NGT    Antimicrobials: Fosfomycin first dose 06/13/2020 Anticipating second dose 06/16/2020   Consultants: None   ------------------------------------------------------------------------------------------------------------------------------------  DVT prophylaxis:  Lovenox SQ Code Status:   Code Status: Full Code  Family Communication: No family member present at bedside - Callledl Mr. Jaquita Folds at [phone] updated  ProvidedPCP in [place] Dr. Marnee Guarneri [phone] (called and left a message) -Instructed RN to follow-up and obtain medical records.   Plan of care was discussed with the patient- ?  Understanding  -Advance care planning has been discussed.   Admission status:   Status is: Inpatient  Remains inpatient appropriate because:Inpatient level of care appropriate due to severity of illness   Dispo: The patient is from: Home              Anticipated d/c is to: Patient is from the independent living --the patient and her son wishes to  be moved to assisted living facility              Anticipated d/c date is: 3 days              Patient currently is not medically stable to d/c.   Difficult to place patient Yes      Level of care: Med-Surg   Procedures:   No admission procedures for hospital encounter.     Antimicrobials:  Anti-infectives  (From admission, onward)   Start     Dose/Rate Route Frequency Ordered Stop   06/13/20 1800  fosfomycin (MONUROL) packet 3 g        3 g Oral  Once 06/13/20 1754 06/13/20 2014       Medication:    acetaminophen **OR** acetaminophen, ondansetron **OR** ondansetron (ZOFRAN) IV   Objective:   Vitals:   06/14/20 0530 06/14/20 0545 06/14/20 0600 06/14/20 0615  BP: (!) 165/85 (!) 162/78 (!) 159/75 (!) 179/78  Pulse: 80 79 74 76  Resp: 17 19 16 17   Temp:      TempSrc:      SpO2: 97% 97% 97% 98%   No intake or output data in the 24 hours ending 06/14/20 0645 There were no vitals filed for this visit.   Examination:   Physical Exam  Constitution: Confused--no distress,  Appears calm and comfortable  Psychiatric: Normal and stable mood and affect, cognition intact,   HEENT: Normocephalic,  PERRL, otherwise with in Normal limits  Chest:Chest symmetric Cardio vascular:  S1/S2, RRR, No murmure, No Rubs or Gallops  pulmonary: Clear to auscultation bilaterally, respirations unlabored, negative wheezes / crackles Abdomen: Soft, non-tender, non-distended, bowel sounds,no masses, no organomegaly Muscular skeletal: Limited exam - in bed, able to move all 4 extremities, Normal strength,  Neuro: limited  CNII-XII intact. , normal motor and sensation, reflexes intact  Extremities: No pitting edema lower extremities, +2 pulses  Skin: Dry, warm to touch, negative for any Rashes, No open wounds Wounds: per nursing documentation    ------------------------------------------------------------------------------------------------------------------------------------------    LABs:  CBC Latest Ref Rng & Units 06/14/2020 06/13/2020 06/13/2020  WBC 4.0 - 10.5 K/uL 10.3 - 11.8(H)  Hemoglobin 12.0 - 15.0 g/dL 11.8(L) 11.2(L) 12.0  Hematocrit 36.0 - 46.0 % 35.8(L) 33.0(L) 38.5  Platelets 150 - 400 K/uL 266 - 49(L)   CMP Latest Ref Rng & Units 06/14/2020 06/13/2020 06/13/2020  Glucose 70 - 99 mg/dL  081(K) 481(E) 563(J)  BUN 8 - 23 mg/dL 19 49(F) 21  Creatinine 0.44 - 1.00 mg/dL 0.26(V) 7.85 8.85(O)  Sodium 135 - 145 mmol/L 136 140 136  Potassium 3.5 - 5.1 mmol/L 3.7 4.1 4.2  Chloride 98 - 111 mmol/L 105 111 105  CO2 22 - 32 mmol/L 19(L) - 20(L)  Calcium 8.9 - 10.3 mg/dL 9.1 - 8.8(L)  Total Protein 6.5 - 8.1 g/dL 6.4(L) - 6.7  Total Bilirubin 0.3 - 1.2 mg/dL 1.0 - 1.2  Alkaline Phos 38 - 126 U/L 46 - 49  AST 15 - 41 U/L 15 - 24  ALT 0 - 44 U/L 15 - 15       Micro Results Recent Results (from the past 240 hour(s))  Resp Panel by RT-PCR (Flu A&B, Covid) Nasopharyngeal Swab     Status: None   Collection Time: 06/13/20  8:20 PM   Specimen: Nasopharyngeal Swab; Nasopharyngeal(NP) swabs in vial transport medium  Result Value Ref Range Status   SARS Coronavirus 2 by RT PCR NEGATIVE NEGATIVE Final    Comment: (NOTE) SARS-CoV-2 target nucleic acids are NOT DETECTED.  The SARS-CoV-2 RNA is generally detectable in upper respiratory specimens during the acute phase of infection. The lowest concentration of SARS-CoV-2 viral copies this assay can detect is 138 copies/mL. A negative result does not preclude SARS-Cov-2 infection and should not be used as the sole basis for treatment or other patient management decisions. A negative result may occur with  improper specimen collection/handling, submission of specimen other than nasopharyngeal swab, presence of viral mutation(s) within the areas targeted by this assay, and inadequate number of viral copies(<138 copies/mL). A negative result must be combined with clinical observations, patient history, and epidemiological information. The expected result is Negative.  Fact Sheet for Patients:  BloggerCourse.com  Fact Sheet for Healthcare Providers:  SeriousBroker.it  This test is no t yet approved or cleared by the [place] FDA and  has been authorized for detection and/or  diagnosis of SARS-CoV-2 by FDA under an Emergency Use Authorization (EUA). This EUA will remain  in effect (meaning this test can be used) for the duration of the COVID-19 declaration under Section 564(b)(1) of the Act, 21 U.S.C.section 360bbb-3(b)(1), unless the authorization is terminated  or revoked sooner.       Influenza A by PCR NEGATIVE NEGATIVE Final   Influenza B by PCR NEGATIVE NEGATIVE Final    Comment: (NOTE) The Xpert Xpress SARS-CoV-2/FLU/RSV plus assay is intended as an aid in the diagnosis of influenza  from Nasopharyngeal swab specimens and should not be used as a sole basis for treatment. Nasal washings and aspirates are unacceptable for Xpert Xpress SARS-CoV-2/FLU/RSV testing.  Fact Sheet for Patients: BloggerCourse.com  Fact Sheet for Healthcare Providers: SeriousBroker.it  This test is not yet approved or cleared by the [place] FDA and has been authorized for detection and/or diagnosis of SARS-CoV-2 by FDA under an Emergency Use Authorization (EUA). This EUA will remain in effect (meaning this test can be used) for the duration of the COVID-19 declaration under Section 564(b)(1) of the Act, 21 U.S.C. section 360bbb-3(b)(1), unless the authorization is terminated or revoked.  Performed at [facility], [address], [place], [place] [address]     Radiology Reports DG Chest 2 View  Result Date: 06/13/2020 CLINICAL DATA:  Status post fall. EXAM: CHEST - 2 VIEW COMPARISON:  None. FINDINGS: The heart size and mediastinal contours are within normal limits. No pneumothorax or pleural effusion is noted. Mild bibasilar subsegmental atelectasis or scarring is noted. The visualized skeletal structures are unremarkable. IMPRESSION: Mild bibasilar subsegmental atelectasis or scarring. Electronically Signed   By: Lupita Raider M.D.   On: 06/13/2020 16:42   CT Head Wo Contrast  Result Date:  06/13/2020 CLINICAL DATA:  Head trauma EXAM: CT HEAD WITHOUT CONTRAST CT CERVICAL SPINE WITHOUT CONTRAST TECHNIQUE: Multidetector CT imaging of the head and cervical spine was performed following the standard protocol without intravenous contrast. Multiplanar CT image reconstructions of the cervical spine were also generated. COMPARISON:  None. FINDINGS: CT HEAD FINDINGS Brain: Ventricle size normal. Mild atrophy. Chronic infarct left parietal lobe with encephalomalacia and mild calcification. Negative for acute infarct, hemorrhage, mass. Vascular: Negative for hyperdense vessel Skull: Negative Sinuses/Orbits: Paranasal sinuses clear.  Negative orbit Other: CT CERVICAL SPINE FINDINGS Alignment: Slight anterolisthesis C4-5.  Mild cervical kyphosis. Skull base and vertebrae: Negative for fracture. Soft tissues and spinal canal: Hypoplastic left thyroid. 2 cm nodule right thyroid. No enlarged lymph nodes in the neck Disc levels: Disc degeneration and spurring C4 through C7. This is most severe at C5-6 with diffuse uncinate spurring as well as a calcified extruded disc fragment extending caudally. There is moderate spinal stenosis. Severe right foraminal encroachment and moderate left foraminal encroachment at C5-6. Upper chest: Lung apices clear bilaterally Other: None IMPRESSION: 1. No acute intracranial abnormality 2. Negative for cervical spine fracture 3. Cervical spine degenerative change with moderate spinal stenosis at C5-6. Electronically Signed   By: Marlan Palau M.D.   On: 06/13/2020 16:01   CT Cervical Spine Wo Contrast  Result Date: 06/13/2020 CLINICAL DATA:  Head trauma EXAM: CT HEAD WITHOUT CONTRAST CT CERVICAL SPINE WITHOUT CONTRAST TECHNIQUE: Multidetector CT imaging of the head and cervical spine was performed following the standard protocol without intravenous contrast. Multiplanar CT image reconstructions of the cervical spine were also generated. COMPARISON:  None. FINDINGS: CT HEAD FINDINGS  Brain: Ventricle size normal. Mild atrophy. Chronic infarct left parietal lobe with encephalomalacia and mild calcification. Negative for acute infarct, hemorrhage, mass. Vascular: Negative for hyperdense vessel Skull: Negative Sinuses/Orbits: Paranasal sinuses clear.  Negative orbit Other: CT CERVICAL SPINE FINDINGS Alignment: Slight anterolisthesis C4-5.  Mild cervical kyphosis. Skull base and vertebrae: Negative for fracture. Soft tissues and spinal canal: Hypoplastic left thyroid. 2 cm nodule right thyroid. No enlarged lymph nodes in the neck Disc levels: Disc degeneration and spurring C4 through C7. This is most severe at C5-6 with diffuse uncinate spurring as well as a calcified extruded disc fragment extending  caudally. There is moderate spinal stenosis. Severe right foraminal encroachment and moderate left foraminal encroachment at C5-6. Upper chest: Lung apices clear bilaterally Other: None IMPRESSION: 1. No acute intracranial abnormality 2. Negative for cervical spine fracture 3. Cervical spine degenerative change with moderate spinal stenosis at C5-6. Electronically Signed   By: Marlan Palauharles  Clark M.D.   On: 06/13/2020 16:01   DG Knee Complete 4 Views Left  Result Date: 06/13/2020 CLINICAL DATA:  Left knee pain after fall several days ago. EXAM: LEFT KNEE - COMPLETE 4+ VIEW COMPARISON:  None. FINDINGS: No evidence of fracture, dislocation, or joint effusion. Mild narrowing of medial and lateral joint spaces are noted with osteophyte formation. Severe narrowing of patellofemoral space is noted. Soft tissues are unremarkable. IMPRESSION: Tricompartmental degenerative joint disease. No acute abnormality seen in the left knee. Electronically Signed   By: Lupita RaiderJames  Green Jr M.D.   On: 06/13/2020 16:43   DG Hip Unilat W or Wo Pelvis 2-3 Views Left  Result Date: 06/13/2020 CLINICAL DATA:  Acute left hip pain after fall several days ago. EXAM: DG HIP (WITH OR WITHOUT PELVIS) 2-3V LEFT COMPARISON:  None.  FINDINGS: There is no evidence of hip fracture or dislocation. There is no evidence of arthropathy or other focal bone abnormality. IMPRESSION: Negative. Electronically Signed   By: Lupita RaiderJames  Green Jr M.D.   On: 06/13/2020 16:44    SIGNED: Kendell BaneSeyed A Esma Kilts, MD, FHM. [facility],  Pager (please use [email or web address] to page/text) Please use Epic Secure Chat for non-urgent communication (7AM-7PM)  If 7PM-7AM, please contact night-coverage [email or web address], 06/14/2020, 6:45 AM

## 2020-06-14 NOTE — ED Notes (Signed)
Dr Flossie Dibble paged and instant message sent to verify pt bed placement

## 2020-06-14 NOTE — ED Notes (Signed)
Sleeping, resp even and nonlabored in Normal SR, rate of 77

## 2020-06-14 NOTE — ED Notes (Addendum)
12 beat run of V-tach, no LOC, B/P 158/71, then went into aflutter for a few beats and back to NSR, MD notified, pt also has sudden onset rash to face, upper chest and upper arms - non raised, no hives, denies itching or any pain, states she does this with anxiety, no distress

## 2020-06-14 NOTE — ED Notes (Signed)
Dinner tray delivered.

## 2020-06-14 NOTE — Evaluation (Addendum)
Physical Therapy Evaluation Patient Details Name: Kristy Jensen MRN: [id number] DOB: 1950/12/13 Today's Date: 06/14/2020   History of Present Illness  70 yo female with onset of mult falls since recently moving from [place] was brought to hosp.  Pt has increased confusion prior to move, new UTI and metabolic encephalopathy. Additionally has hyperglycemia, run of v-tach.  PMHx:  CVA,  Clinical Impression  Pt was seen for initial mobility on bed, sitting only partially and has reluctance over L hip.  Messaged MD and nurse to let them know about the concerns, but currently pt is having some cardiac arrhythmias that began after she was still on the bed.  Pt is continuing on with PT as her condition will permit, and will have PT check on her status with cardiac function to continue therapy goals of restoration of mobility, as well as with consideration for pt willingness to move.  SNF is best current recommendation given the fairly dependent movement pt has.    Follow Up Recommendations SNF;Supervision/Assistance - 24 hour    Equipment Recommendations  None recommended by PT    Recommendations for Other Services       Precautions / Restrictions Precautions Precautions: Fall Precaution Comments: pt is minimally verbal, slow speech and takes time to get words Restrictions Weight Bearing Restrictions: No      Mobility  Bed Mobility Overal bed mobility: Needs Assistance Bed Mobility: Supine to Sit;Sit to Supine     Supine to sit: Max assist Sit to supine: Max assist   General bed mobility comments: pt actively resisting trying to move and was able to partially sit, then returned to bed with pt assisting with legs    Transfers                 General transfer comment: pt was unable to stand  Ambulation/Gait             General Gait Details: unable to stand  Stairs            Wheelchair Mobility    Modified Rankin (Stroke Patients Only)       Balance  Overall balance assessment: Needs assistance;History of Falls Sitting-balance support: Bilateral upper extremity supported;Feet supported Sitting balance-Leahy Scale: Zero Sitting balance - Comments: pt is not willing/able to try to fully sit up                                     Pertinent Vitals/Pain Pain Assessment: Faces Faces Pain Scale: Hurts little more Pain Location: L hip Pain Intervention(s): Limited activity within patient's tolerance;Repositioned;Other (comment) (messaging to MD and RN as pt has fallen x 2)    Home Living Family/patient expects to be discharged to:: Unsure                 Additional Comments: pt cannot give home details    Prior Function Level of Independence: Needs assistance   Gait / Transfers Assistance Needed: pt fell from bed but had been walking  ADL's / Homemaking Assistance Needed: pt is unable to tell the details        Hand Dominance        Extremity/Trunk Assessment   Upper Extremity Assessment Upper Extremity Assessment: Generalized weakness    Lower Extremity Assessment Lower Extremity Assessment: Generalized weakness;LLE deficits/detail LLE Deficits / Details: pt is actively resisting moving at all esp L hip    Cervical / Trunk  Assessment Cervical / Trunk Assessment: Normal  Communication   Communication: Expressive difficulties  Cognition Arousal/Alertness: Lethargic Behavior During Therapy: Flat affect Overall Cognitive Status: History of cognitive impairments - at baseline                                 General Comments: pt is not very verbal but unclear if this is pre-existing or new      General Comments General comments (skin integrity, edema, etc.): pt was briefly tried to sit up and then had to return her to gurney.  Was shaking and complaining of feeling cold.  PT was working on recovering wth blankets when pt had elevation of HR to 117, and nursing came in to see about run  of vtach.  At that time pt had been still on the bed, no movement while PT was covering her    Exercises     Assessment/Plan    PT Assessment Patient needs continued PT services  PT Problem List Decreased strength;Decreased activity tolerance;Decreased balance;Decreased mobility;Decreased cognition;Cardiopulmonary status limiting activity       PT Treatment Interventions DME instruction;Gait training;Functional mobility training;Therapeutic activities;Therapeutic exercise;Balance training;Neuromuscular re-education;Patient/family education    PT Goals (Current goals can be found in the Care Plan section)  Acute Rehab PT Goals Patient Stated Goal: none stated PT Goal Formulation: Patient unable to participate in goal setting Time For Goal Achievement: 06/28/20 Potential to Achieve Goals: Fair    Frequency Min 3X/week   Barriers to discharge  (unclear what these limits are) pt cannot give a full history    Co-evaluation               AM-PAC PT "6 Clicks" Mobility  Outcome Measure Help needed turning from your back to your side while in a flat bed without using bedrails?: A Lot Help needed moving from lying on your back to sitting on the side of a flat bed without using bedrails?: Total Help needed moving to and from a bed to a chair (including a wheelchair)?: Total Help needed standing up from a chair using your arms (e.g., wheelchair or bedside chair)?: Total Help needed to walk in hospital room?: Total Help needed climbing 3-5 steps with a railing? : Total 6 Click Score: 7    End of Session   Activity Tolerance: Treatment limited secondary to medical complications (Comment) (V tach and anxiety with pt) Patient left: in bed;with call bell/phone within reach;with nursing/sitter in room Nurse Communication: Mobility status;Other (comment) (contacted MD and nursing about falls and L hip pain) PT Visit Diagnosis: Other abnormalities of gait and mobility  (R26.89);Difficulty in walking, not elsewhere classified (R26.2);Other (comment) (V tach)    Time: 6389-3734 PT Time Calculation (min) (ACUTE ONLY): 22 min   Charges:   PT Evaluation $PT Eval Moderate Complexity: 1 Mod         Ivar Drape 06/14/2020, 4:42 PM  Samul Dada, PT MS Acute Rehab Dept. Number: [facility] [phone] and [facility] [phone]

## 2020-06-14 NOTE — ED Notes (Signed)
Dr Flossie Dibble paged to report elevated troponin, awaiting call back

## 2020-06-14 NOTE — ED Notes (Addendum)
Attempted IV placement to Rt wrist and right forearm without success.  Will request another nurse or IV team

## 2020-06-14 NOTE — ED Notes (Signed)
Pt had brief run of V-tach, 25 - 30 beats, no LOC, Physical therapist at bedside, states pt was lying flat and that no exercises were being done, states they were talking about the pain in her left hip and that pt never lost consciousness, pt then went into atrial flutter and back into NSR

## 2020-06-14 NOTE — ED Notes (Signed)
Dr. Clayborne Artist returned page and stated that the service she is with is full and that she is requesting that pt be reassigned to another MD

## 2020-06-14 NOTE — ED Notes (Signed)
Dr. Erin Hearing returned page, awaiting new orders

## 2020-06-15 ENCOUNTER — Encounter (HOSPITAL_COMMUNITY): Payer: Self-pay | Admitting: Family Medicine

## 2020-06-15 ENCOUNTER — Inpatient Hospital Stay (HOSPITAL_COMMUNITY): Payer: Medicare HMO

## 2020-06-15 DIAGNOSIS — I498 Other specified cardiac arrhythmias: Secondary | ICD-10-CM

## 2020-06-15 DIAGNOSIS — I4892 Unspecified atrial flutter: Secondary | ICD-10-CM | POA: Diagnosis not present

## 2020-06-15 LAB — CBC
HCT: 36.7 % (ref 36.0–46.0)
Hemoglobin: 11.3 g/dL — ABNORMAL LOW (ref 12.0–15.0)
MCH: 26.9 pg (ref 26.0–34.0)
MCHC: 30.8 g/dL (ref 30.0–36.0)
MCV: 87.4 fL (ref 80.0–100.0)
Platelets: 250 10*3/uL (ref 150–400)
RBC: 4.2 MIL/uL (ref 3.87–5.11)
RDW: 14 % (ref 11.5–15.5)
WBC: 10.1 10*3/uL (ref 4.0–10.5)
nRBC: 0 % (ref 0.0–0.2)

## 2020-06-15 LAB — GLUCOSE, CAPILLARY
Glucose-Capillary: 109 mg/dL — ABNORMAL HIGH (ref 70–99)
Glucose-Capillary: 103 mg/dL — ABNORMAL HIGH (ref 70–99)
Glucose-Capillary: 113 mg/dL — ABNORMAL HIGH (ref 70–99)
Glucose-Capillary: 151 mg/dL — ABNORMAL HIGH (ref 70–99)

## 2020-06-15 LAB — BASIC METABOLIC PANEL
Anion gap: 10 (ref 5–15)
BUN: 14 mg/dL (ref 8–23)
CO2: 20 mmol/L — ABNORMAL LOW (ref 22–32)
Calcium: 8.7 mg/dL — ABNORMAL LOW (ref 8.9–10.3)
Chloride: 107 mmol/L (ref 98–111)
Creatinine, Ser: 0.92 mg/dL (ref 0.44–1.00)
GFR, Estimated: >60 mL/min (ref >60)
Glucose, Bld: 116 mg/dL — ABNORMAL HIGH (ref 70–99)
Potassium: 4 mmol/L (ref 3.5–5.1)
Sodium: 137 mmol/L (ref 135–145)

## 2020-06-15 LAB — ECHOCARDIOGRAM COMPLETE
Area-P 1/2: 2.83 cm2
S' Lateral: 2.8 cm
Weight: 3848.35 oz

## 2020-06-15 MED ORDER — ROSUVASTATIN CALCIUM 20 MG PO TABS
20.0000 mg | ORAL_TABLET | Freq: Every day | ORAL | Status: DC
  Administered 2020-06-15 – 2020-06-17 (×3): 20 mg via ORAL
  Filled 2020-06-15 (×3): qty 1

## 2020-06-15 NOTE — TOC Initial Note (Signed)
Transition of Care [facility]) - Initial/Assessment Note    Patient Details  Name: Kristy Jensen MRN: [id number] Date of Birth: 1951/04/25  Transition of Care [facility]) CM/SW Contact:    Kristy Jensen, LCSWA Phone Number: 06/15/2020, 10:32 AM  Clinical Narrative:                  CSW spoke with patient's son,Kristy Jensen- CSW introduced self and explained role. He confirmed patient is from [facility] [place]. CSW discussed therapy recommendation of short term rehab at [facility]. Kristy Jensen agrees with recommendation. CSW explained the SNF process. Family states no questions at this time. He expressed concerns about being being able to reach the patient by phone. Family was provided with patient's nurse's station number to follow up on her status.  CSW will provide bed offers once available CSW will continue to follow and assist with discharge planning.  Kristy Jensen, MSW, LCSW Clinical Social Worker   Expected Discharge Plan: Skilled Nursing Facility Barriers to Discharge: Insurance Authorization,Continued Medical Work up,SNF Pending bed offer   Patient Goals and CMS Choice        Expected Discharge Plan and Services Expected Discharge Plan: Skilled Nursing Facility In-house Referral: Clinical Social Work     Living arrangements for the past 2 months: Independent Living Facility                                      Prior Living Arrangements/Services Living arrangements for the past 2 months: Independent Living Facility Lives with:: Self,Facility Resident Patient language and need for interpreter reviewed:: No        Need for Family Participation in Patient Care: Yes (Comment) Care giver support system in place?: Yes (comment)   Criminal Activity/Legal Involvement Pertinent to Current Situation/Hospitalization: No - Comment as needed  Activities of Daily Living      Permission Sought/Granted                  Emotional Assessment         Alcohol / Substance Use: Not  Applicable Psych Involvement: No (comment)  Admission diagnosis:  Metabolic encephalopathy [G93.41] Lower urinary tract infectious disease [N39.0] Confusion [R41.0] Altered mental status [R41.82] Patient Active Problem List   Diagnosis Date Noted  . Altered mental status 06/14/2020  . Metabolic encephalopathy 06/13/2020  . CVA (cerebral vascular accident) (HCC) 06/13/2020  . Acute lower UTI 06/13/2020  . Hyperglycemia 06/13/2020   PCP:  Patient, No Pcp Per Pharmacy:   [id number] - [place], [place] - [address] AT [address] [facility] [address] [place] [address] Phone: [phone] Fax: [phone]     Social Determinants of Health (SDOH) Interventions    Readmission Risk Interventions No flowsheet data found.

## 2020-06-15 NOTE — Evaluation (Signed)
Occupational Therapy Evaluation Patient Details Name: Kristy Jensen MRN: [id number] DOB: 13-Aug-1950 Today's Date: 06/15/2020    History of Present Illness 70 yo female with onset of mult falls since recently moving from [place] was brought to hosp.  Pt has increased confusion prior to move, new UTI and metabolic encephalopathy. Additionally has hyperglycemia, run of v-tach.  PMHx:  CVA,   Clinical Impression   Pt is a poor historian and no family available to provide prior level of functioning and home information. Pt currently demonstrates cognitive limitations, generalized weakness, pain in BLE, and overall deconditioning. Pt unable to complete bed mobility this session secondary to following one step commands <10% of the time with hand over hand assistance. Due to weakness, cognitive limitations and body habitus, pt would require +2/+3 person assistance for rolling in bed. Further mobility deferred secondary to pt and therapist safety. Pt able to eat lunch after setup assistance. Recommend SNF follow-up therapy with continued skilled acute occupational therapy to progress pt toward prior level functioning and allow safe d/c to venue listed below.     Follow Up Recommendations  SNF;Supervision/Assistance - 24 hour    Equipment Recommendations  Other (comment) (defer to next venue)    Recommendations for Other Services       Precautions / Restrictions Precautions Precautions: Fall Restrictions Weight Bearing Restrictions: No      Mobility Bed Mobility Overal bed mobility: Needs Assistance Bed Mobility: Rolling Rolling: Total assist;+2 for physical assistance;+2 for safety/equipment         General bed mobility comments: attempted to have pt roll in bed, pt with limitations with command following despite hand over hand assistance and simple one step commands, pt would required +2/+3 person assist, deferred further mobility secondary to limited assistance    Transfers                  General transfer comment: deferred due to safety and cognition    Balance       Sitting balance - Comments: deferred                                   ADL either performed or assessed with clinical judgement   ADL Overall ADL's : Needs assistance/impaired Eating/Feeding: Set up;Sitting Eating/Feeding Details (indicate cue type and reason): pt with 1/2 eaten lunch upon arrival, pt drank milk from carton, required assistance to open container Grooming: Set up;Sitting   Upper Body Bathing: Maximal assistance   Lower Body Bathing: Total assistance   Upper Body Dressing : Maximal assistance   Lower Body Dressing: Total assistance                 General ADL Comments: pt limited to bed level secondary to cognitive limitations and increased need for assistance;pt with difficulty following one step commands     Vision         Perception     Praxis      Pertinent Vitals/Pain Pain Assessment: Faces Faces Pain Scale: Hurts even more Pain Location: BLE with attempts to complete knee flexion ROM Pain Descriptors / Indicators: Grimacing;Guarding Pain Intervention(s): Limited activity within patient's tolerance;Monitored during session     Hand Dominance     Extremity/Trunk Assessment Upper Extremity Assessment Upper Extremity Assessment: Generalized weakness;Difficult to assess due to impaired cognition   Lower Extremity Assessment LLE Deficits / Details: pt is actively resisting moving at all esp L hip  Cervical / Trunk Assessment Cervical / Trunk Assessment: Normal   Communication Communication Communication: Expressive difficulties   Cognition Arousal/Alertness: Lethargic Behavior During Therapy: Flat affect Overall Cognitive Status: No family/caregiver present to determine baseline cognitive functioning                                 General Comments: pt with limited ability following one step commands this  session. Pt following one step commands <10% of the time with max multimodal cues. Pt oriented to place, reports year is 2012 and stated time of date is "40 oclock";Pt scored   General Comments       Exercises Exercises: Other exercises Other Exercises Other Exercises: AAROM to BUE with increased time and increased cues due to limtiations with command following Other Exercises: attempted ROM to BLE, pt actively resisting movement   Shoulder Instructions      Home Living Family/patient expects to be discharged to:: Private residence Living Arrangements: Alone                               Additional Comments: pt with cognitive limitations and is poor historian, no family avaialbe to provide prior level and home information      Prior Functioning/Environment Level of Independence: Needs assistance  Gait / Transfers Assistance Needed: per chart, pt fell from bed but had been walking ADL's / Homemaking Assistance Needed: unreliable historian, pt reports she has been independent and has not had any falls            OT Problem List: Decreased strength;Decreased range of motion;Decreased activity tolerance;Impaired balance (sitting and/or standing);Decreased coordination;Decreased cognition;Decreased safety awareness;Decreased knowledge of use of DME or AE;Cardiopulmonary status limiting activity;Obesity;Pain      OT Treatment/Interventions: Self-care/ADL training;Therapeutic exercise;Energy conservation;DME and/or AE instruction;Therapeutic activities;Cognitive remediation/compensation;Patient/family education;Balance training    OT Goals(Current goals can be found in the care plan section) Acute Rehab OT Goals Patient Stated Goal: none stated OT Goal Formulation: With patient Time For Goal Achievement: 06/29/20 Potential to Achieve Goals: Good ADL Goals Pt Will Perform Grooming: with modified independence;sitting Pt Will Transfer to Toilet: with mod assist;stand  pivot transfer Additional ADL Goal #1: Pt will follow one step commands consistently during ADL and functional mobility completion. Additional ADL Goal #2: Pt will complete bed mobility with moderate assistance in preparation for OOB ADL.  OT Frequency: Min 2X/week   Barriers to D/C:            Co-evaluation              AM-PAC OT "6 Clicks" Daily Activity     Outcome Measure Help from another person eating meals?: A Little Help from another person taking care of personal grooming?: A Little Help from another person toileting, which includes using toliet, bedpan, or urinal?: Total Help from another person bathing (including washing, rinsing, drying)?: Total Help from another person to put on and taking off regular upper body clothing?: A Lot Help from another person to put on and taking off regular lower body clothing?: Total 6 Click Score: 11   End of Session Nurse Communication: Mobility status  Activity Tolerance: Patient tolerated treatment well Patient left: in bed;with call bell/phone within reach;with bed alarm set  OT Visit Diagnosis: Unsteadiness on feet (R26.81);Other abnormalities of gait and mobility (R26.89);Muscle weakness (generalized) (M62.81);Other symptoms and signs involving cognitive function;Pain Pain - Right/Left:  (  bilateral) Pain - part of body: Knee                Time: 8469-6295 OT Time Calculation (min): 13 min Charges:  OT General Charges $OT Visit: 1 Visit OT Evaluation $OT Eval Moderate Complexity: 1 Mod  Siddhanth Denk OTR/L Acute Rehabilitation Services Office: [phone]   Rebeca Alert 06/15/2020, 3:25 PM

## 2020-06-15 NOTE — Plan of Care (Signed)

## 2020-06-15 NOTE — Progress Notes (Signed)
Echocardiogram 2D Echocardiogram has been performed.  Kristy Jensen Kristy Jensen 06/15/2020, 10:46 AM

## 2020-06-15 NOTE — NC FL2 (Signed)
[place] MEDICAID FL2 LEVEL OF CARE SCREENING TOOL     IDENTIFICATION  Patient Name: Kristy Jensen Birthdate: 04/09/1951 Sex: female Admission Date (Current Location): 06/13/2020  [facility] and [place] Number:  Producer, television/film/video and Address:  The . [place] [facility], [address] [place], [place] [address]      Provider Number: [id number]  Attending Physician Name and Address:  Kendell Bane, MD  Relative Name and Phone Number:       Current Level of Care: Hospital Recommended Level of Care: Skilled Nursing Facility Prior Approval Number:    Date Approved/Denied:   PASRR Number: [id number]  Discharge Plan: SNF    Current Diagnoses: Patient Active Problem List   Diagnosis Date Noted  . Altered mental status 06/14/2020  . Metabolic encephalopathy 06/13/2020  . CVA (cerebral vascular accident) (HCC) 06/13/2020  . Acute lower UTI 06/13/2020  . Hyperglycemia 06/13/2020    Orientation RESPIRATION BLADDER Height & Weight     Self  Normal External catheter,Incontinent Weight: 240 lb 8.4 oz (109.1 kg) Height:     BEHAVIORAL SYMPTOMS/MOOD NEUROLOGICAL BOWEL NUTRITION STATUS      Continent Diet (please see discharge summary)  AMBULATORY STATUS COMMUNICATION OF NEEDS Skin   Limited Assist Verbally Other (Comment) (wound/incision (MASD) RT,LFT)                       Personal Care Assistance Level of Assistance  Bathing,Feeding,Dressing Bathing Assistance: Limited assistance Feeding assistance: Independent Dressing Assistance: Limited assistance     Functional Limitations Info  Sight,Hearing,Speech Sight Info: Adequate Hearing Info: Adequate Speech Info: Adequate    SPECIAL CARE FACTORS FREQUENCY  PT (By licensed PT),OT (By licensed OT)     PT Frequency: 5x per week OT Frequency: 5x per week            Contractures Contractures Info: Not present    Additional Factors Info  Code Status,Allergies,Insulin Sliding Scale Code  Status Info: FULL Allergies Info: Penicillins,Sulfa Antibiotics   Insulin Sliding Scale Info: please see discharge summary       Current Medications (06/15/2020):  This is the current hospital active medication list Current Facility-Administered Medications  Medication Dose Route Frequency Provider Last Rate Last Admin  . 0.9 %  sodium chloride infusion   Intravenous Continuous Earlie Lou L, MD 100 mL/hr at 06/14/20 2212 Restarted at 06/14/20 2212  . acetaminophen (TYLENOL) tablet 650 mg  650 mg Oral Q6H PRN Rometta Emery, MD   650 mg at 06/14/20 0805   Or  . acetaminophen (TYLENOL) suppository 650 mg  650 mg Rectal Q6H PRN Earlie Lou L, MD      . amLODipine (NORVASC) tablet 5 mg  5 mg Oral Daily Shahmehdi, Seyed A, MD   5 mg at 06/15/20 0813  . aspirin EC tablet 81 mg  81 mg Oral Daily Shahmehdi, Seyed A, MD   81 mg at 06/15/20 0813  . hydrALAZINE (APRESOLINE) injection 10 mg  10 mg Intravenous Q4H PRN Shahmehdi, Seyed A, MD      . insulin aspart (novoLOG) injection 0-9 Units  0-9 Units Subcutaneous TID WC Kendell Bane, MD   2 Units at 06/14/20 0805  . ondansetron (ZOFRAN) tablet 4 mg  4 mg Oral Q6H PRN Rometta Emery, MD       Or  . ondansetron (ZOFRAN) injection 4 mg  4 mg Intravenous Q6H PRN Rometta Emery, MD  Discharge Medications: Please see discharge summary for a list of discharge medications.  Relevant Imaging Results:  Relevant Lab Results:   Additional Information SSN [id number]  Eduard Roux, [place]

## 2020-06-15 NOTE — Consult Note (Addendum)
Cardiology Consultation:   Patient ID: Kristy Jensen MRN: [id number]; DOB: 1950-07-16  Admit date: 06/13/2020 Date of Consult: 06/15/2020  PCP:  Patient, No Pcp Per   [facility]  Cardiologist:  New to [facility] Advanced Practice Provider:  No care team member to display Electrophysiologist:  None    Patient Profile:   Kristy Jensen is a 70 y.o. female with a hx of CVA in 2019 and hyperlipidemia who is being seen today for the evaluation of possible NSVT and atrial flutter at the request of Dr. Flossie Dibble.  History of Present Illness:   Kristy Jensen is a 70 year old female with past medical history of CVA in 2019 and hyperlipidemia.  According to the patient, she takes aspirin and a cholesterol pill at home.  She denies any prior history of high blood pressure and diabetes.  She does not have any prior history of irregular rhythm or any other cardiac issues.  Patient was recently moved from [place] to [place] to be closer to her son due to declining mental status.  Despite so, she is still able to function independently at home without any issue.  Her son send the patient to the emergency room on 06/13/2020 due to worsening confusion/altered mental status.  Patient denies any recent chest pain or worsening shortness of breath.  She did have two falls recently, however says she never had a balance issue prior to the recent fall.  Initial work-up at [facility] revealed urinary tract infection.  Hemoglobin A1c was 6.0 which placed the patient in the prediabetes range.  Lactic acid was normal.  Urine drug test normal.  CT of the head and neck shows no acute fracture despite recent fall.  Chest x-ray showed mild bibasilar segmental atelectasis or scarring, no acute process.  Hip and the knee x-ray were negative for acute fracture.  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  Lower extremity  venous ultrasound obtained on 06/14/2020 showed no evidence of DVT.  While the patient was in the emergency room, there was concern of episode of 12 beat run of nonsustained VT, patient reportedly went into atrial flutter after that however quickly went back to sinus rhythm.  Unfortunately when the patient transfer from the ED to the telemetry unit, previous telemetry has been lost.  Telemetry strips obtained yesterday however shows sinus rhythm with artifact without any evidence of either nonsustained VT or atrial flutter.  Cardiology service has been consulted for irregular rhythm.   Past Medical History:  Diagnosis Date  . Hyperlipidemia   . Prediabetes   . Stroke [facility])     History reviewed. No pertinent surgical history.   Home Medications:  Prior to Admission medications   Medication Sig Start Date End Date Taking? Authorizing Provider  acetaminophen (TYLENOL) 325 MG tablet Take 650 mg by mouth every 6 (six) hours as needed (leg pain).   Yes [provider]  apixaban (ELIQUIS) 5 MG TABS tablet Take 5 mg by mouth daily.   Yes [provider]  aspirin 81 MG chewable tablet Chew 81 mg by mouth daily.   Yes [provider]  dicyclomine (BENTYL) 20 MG tablet Take 20 mg by mouth daily as needed for spasms.   Yes [provider]  fosinopril (MONOPRIL) 40 MG tablet Take 40 mg by mouth daily.   Yes [provider]  CHOLESTYRAMINE PO Take 1 tablet by mouth daily.  [provider]  triamterene-hydrochlorothiazide (DYAZIDE) 37.5-25 MG capsule Take 1 capsule by mouth every other day.    [provider]    Inpatient Medications: Scheduled Meds: . amLODipine  5 mg Oral Daily  . aspirin EC  81 mg Oral Daily  . insulin aspart  0-9 Units Subcutaneous TID WC   Continuous Infusions: . sodium chloride 100 mL/hr at 06/14/20 2212   PRN Meds: acetaminophen **OR** acetaminophen, hydrALAZINE, ondansetron **OR** ondansetron (ZOFRAN)  IV  Allergies:    Allergies  Allergen Reactions  . Penicillins Swelling and Other (See Comments)    Throat swelling/ turned red  . Sulfa Antibiotics Swelling and Other (See Comments)    Throat swelling/turned red    Social History:   Social History   Socioeconomic History  . Marital status: Divorced    Spouse name: Not on file  . Number of children: Not on file  . Years of education: Not on file  . Highest education level: Not on file  Occupational History  . Not on file  Tobacco Use  . Smoking status: Never Smoker  . Smokeless tobacco: Never Used  Substance and Sexual Activity  . Alcohol use: Yes    Comment: socially  . Drug use: Never  . Sexual activity: Not on file  Other Topics Concern  . Not on file  Social History Narrative  . Not on file   Social Determinants of Health   Financial Resource Strain: Not on file  Food Insecurity: Not on file  Transportation Needs: Not on file  Physical Activity: Not on file  Stress: Not on file  Social Connections: Not on file  Intimate Partner Violence: Not on file    Family History:   Family History  Problem Relation Age of Onset  . Heart disease Neg Hx      ROS:  Please see the history of present illness.   All other ROS reviewed and negative.     Physical Exam/Data:   Vitals:   06/15/20 0229 06/15/20 0258 06/15/20 0624 06/15/20 0814  BP: (!) 170/68 (!) 165/76 124/63 (!) 146/61  Pulse: 85 85 88 87  Resp: (!) Temp: 99.9 F (37.7 C) 98.3 F (36.8 C) 98.4 F (36.9 C) 99.5 F (37.5 C)  TempSrc: Temporal Oral Oral Oral  SpO2: 99% 100% 98% 98%  Weight:  109.1 kg      Intake/Output Summary (Last 24 hours) at 06/15/2020 0843 Last data filed at 06/15/2020 0300 Gross per 24 hour  Intake 1563.33 ml  Output -  Net 1563.33 ml   Last 3 Weights 06/15/2020  Weight (lbs) 240 lb 8.4 oz  Weight (kg) 109.1 kg     There is no height or weight on file to calculate BMI.  General:  Well nourished, well  developed, in no acute distress HEENT: normal Lymph: no adenopathy Neck: no JVD Endocrine:  No thryomegaly Vascular: No carotid bruits; FA pulses 2+ bilaterally without bruits  Cardiac:  normal S1, S2; RRR; no murmur  Lungs:  clear to auscultation bilaterally, no wheezing, rhonchi or rales  Abd: soft, nontender, no hepatomegaly  Ext: no edema Musculoskeletal:  No deformities, BUE and BLE strength normal and equal Skin: warm and dry  Neuro:  CNs 2-12 intact, no focal abnormalities noted Psych:  Normal affect   EKG:  The EKG was personally reviewed and demonstrates:  NSR without significant ST-T wave changes Telemetry:  Telemetry was personally reviewed and demonstrates:  NSR without evidence of  afib or atrial flutter  Relevant CV Studies:  N/A  Laboratory Data:  High Sensitivity Troponin:   Recent Labs  Lab 06/13/20 1901 06/13/20 1910 06/14/20 1940  TROPONINIHS 21* 21* 24*     Chemistry Recent Labs  Lab 06/13/20 1431 06/13/20 1506 06/14/20 0337 06/15/20 0604  NA 136 140 136 137  K 4.2 4.1 3.7 4.0  CL 105 111 105 107  CO2 20*  --  19* 20*  GLUCOSE 158* 138* 134* 116*  BUN 21 25* 19 14  CREATININE 1.09* 0.80 1.10* 0.92  CALCIUM 8.8*  --  9.1 8.7*  GFRNONAA 55*  --  54* >60  ANIONGAP 11  --  12 10    Recent Labs  Lab 06/13/20 1431 06/14/20 0337  PROT 6.7 6.4*  ALBUMIN 3.4* 3.1*  AST 24 15  ALT 15 15  ALKPHOS 49 46  BILITOT 1.2 1.0   Hematology Recent Labs  Lab 06/13/20 1431 06/13/20 1506 06/14/20 0337 06/15/20 0604  WBC 11.8*  --  10.3 10.1  RBC 4.34  --  4.16 4.20  HGB 12.0 11.2* 11.8* 11.3*  HCT 38.5 33.0* 35.8* 36.7  MCV 88.7  --  86.1 87.4  MCH 27.6  --  28.4 26.9  MCHC 31.2  --  33.0 30.8  RDW 14.1  --  14.3 14.0  PLT 49*  --  266 250   BNPNo results for input(s): BNP, PROBNP in the last 168 hours.  DDimer No results for input(s): DDIMER in the last 168 hours.   Radiology/Studies:  DG Chest 2 View  Result Date: 06/13/2020 CLINICAL  DATA:  Status post fall. EXAM: CHEST - 2 VIEW COMPARISON:  None. FINDINGS: The heart size and mediastinal contours are within normal limits. No pneumothorax or pleural effusion is noted. Mild bibasilar subsegmental atelectasis or scarring is noted. The visualized skeletal structures are unremarkable. IMPRESSION: Mild bibasilar subsegmental atelectasis or scarring. Electronically Signed   By: Lupita Raider M.D.   On: 06/13/2020 16:42   CT Head Wo Contrast  Result Date: 06/13/2020 CLINICAL DATA:  Head trauma EXAM: CT HEAD WITHOUT CONTRAST CT CERVICAL SPINE WITHOUT CONTRAST TECHNIQUE: Multidetector CT imaging of the head and cervical spine was performed following the standard protocol without intravenous contrast. Multiplanar CT image reconstructions of the cervical spine were also generated. COMPARISON:  None. FINDINGS: CT HEAD FINDINGS Brain: Ventricle size normal. Mild atrophy. Chronic infarct left parietal lobe with encephalomalacia and mild calcification. Negative for acute infarct, hemorrhage, mass. Vascular: Negative for hyperdense vessel Skull: Negative Sinuses/Orbits: Paranasal sinuses clear.  Negative orbit Other: CT CERVICAL SPINE FINDINGS Alignment: Slight anterolisthesis C4-5.  Mild cervical kyphosis. Skull base and vertebrae: Negative for fracture. Soft tissues and spinal canal: Hypoplastic left thyroid. 2 cm nodule right thyroid. No enlarged lymph nodes in the neck Disc levels: Disc degeneration and spurring C4 through C7. This is most severe at C5-6 with diffuse uncinate spurring as well as a calcified extruded disc fragment extending caudally. There is moderate spinal stenosis. Severe right foraminal encroachment and moderate left foraminal encroachment at C5-6. Upper chest: Lung apices clear bilaterally Other: None IMPRESSION: 1. No acute intracranial abnormality 2. Negative for cervical spine fracture 3. Cervical spine degenerative change with moderate spinal stenosis at C5-6. Electronically  Signed   By: Marlan Palau M.D.   On: 06/13/2020 16:01   CT Cervical Spine Wo Contrast  Result Date: 06/13/2020 CLINICAL DATA:  Head trauma EXAM: CT HEAD WITHOUT CONTRAST CT CERVICAL SPINE WITHOUT CONTRAST TECHNIQUE: Multidetector  CT imaging of the head and cervical spine was performed following the standard protocol without intravenous contrast. Multiplanar CT image reconstructions of the cervical spine were also generated. COMPARISON:  None. FINDINGS: CT HEAD FINDINGS Brain: Ventricle size normal. Mild atrophy. Chronic infarct left parietal lobe with encephalomalacia and mild calcification. Negative for acute infarct, hemorrhage, mass. Vascular: Negative for hyperdense vessel Skull: Negative Sinuses/Orbits: Paranasal sinuses clear.  Negative orbit Other: CT CERVICAL SPINE FINDINGS Alignment: Slight anterolisthesis C4-5.  Mild cervical kyphosis. Skull base and vertebrae: Negative for fracture. Soft tissues and spinal canal: Hypoplastic left thyroid. 2 cm nodule right thyroid. No enlarged lymph nodes in the neck Disc levels: Disc degeneration and spurring C4 through C7. This is most severe at C5-6 with diffuse uncinate spurring as well as a calcified extruded disc fragment extending caudally. There is moderate spinal stenosis. Severe right foraminal encroachment and moderate left foraminal encroachment at C5-6. Upper chest: Lung apices clear bilaterally Other: None IMPRESSION: 1. No acute intracranial abnormality 2. Negative for cervical spine fracture 3. Cervical spine degenerative change with moderate spinal stenosis at C5-6. Electronically Signed   By: Marlan Palauharles  Clark M.D.   On: 06/13/2020 16:01   CT HIP LEFT WO CONTRAST  Result Date: 06/14/2020 CLINICAL DATA:  Chronic hip pain on. EXAM: CT OF THE LEFT HIP WITHOUT CONTRAST TECHNIQUE: Multidetector CT imaging of the left hip was performed according to the standard protocol. Multiplanar CT image reconstructions were also generated. COMPARISON:  None.  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 There are degenerative changes of the left hip without evidence for an acute displaced fracture or dislocation. There are advanced degenerative changes of the lower lumbar spine. Ligaments Suboptimally assessed by CT. Muscles and Tendons No hematoma. Soft tissues The visualized urinary bladder is unremarkable. There is scattered sigmoid diverticula without CT evidence for diverticulitis. There are atherosclerotic changes of the visualized abdominal aorta. IMPRESSION: 1. 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 2. Degenerative changes of the left hip without evidence for an acute displaced fracture or dislocation. 3. 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 Aortic Atherosclerosis (ICD10-I70.0). Electronically Signed   By: Katherine Mantlehristopher  Green M.D.   On: 06/14/2020 20:55   DG Knee Complete 4 Views Left  Result Date: 06/13/2020 CLINICAL DATA:  Left knee pain after fall several days ago. EXAM: LEFT KNEE - COMPLETE 4+ VIEW COMPARISON:  None. FINDINGS: No evidence of fracture, dislocation, or joint effusion. Mild narrowing of medial and lateral joint spaces are noted with osteophyte formation. Severe narrowing of patellofemoral space is noted. Soft tissues are unremarkable. IMPRESSION: Tricompartmental degenerative joint disease. No acute abnormality seen in the left knee. Electronically Signed   By: Lupita RaiderJames  Green Jr M.D.   On: 06/13/2020 16:43   DG Hip Unilat W or Wo Pelvis 2-3 Views Left  Result Date: 06/13/2020 CLINICAL DATA:  Acute left hip pain after fall several days ago. EXAM: DG HIP (WITH OR WITHOUT PELVIS) 2-3V LEFT COMPARISON:  None. FINDINGS: There is no evidence of hip fracture or dislocation. There is no evidence of arthropathy or other focal bone abnormality. IMPRESSION: Negative. Electronically Signed   By: Lupita RaiderJames  Green Jr M.D.   On:  06/13/2020 16:44   VAS US LOWER EXTREMITY VENOUS (DVT)  Result Date: 06/14/2020  Lower Venous DVT Study Indications: Swelling.  Limitations: Patient's inability to move left leg. Comparison Study: No prior study Performing Technologist: Sherren Kernsandace Kanady RVS  Examination Guidelines: A complete evaluation includes B-mode imaging, spectral Doppler, color Doppler, and power Doppler as needed of all accessible portions of each vessel. Bilateral testing is considered an integral part of a complete examination. Limited examinations for reoccurring indications may be performed as noted. The reflux portion of the exam is performed with the patient in reverse Trendelenburg.  Right Technical Findings: Right leg not evaluated.  +---------+---------------+---------+-----------+----------+-------------------+ LEFT     CompressibilityPhasicitySpontaneityPropertiesThrombus Aging      +---------+---------------+---------+-----------+----------+-------------------+ CFV      Full           Yes      Yes                                      +---------+---------------+---------+-----------+----------+-------------------+ SFJ      Full                                                             +---------+---------------+---------+-----------+----------+-------------------+ FV Prox  Full                                                             +---------+---------------+---------+-----------+----------+-------------------+ FV Mid   Full                                                             +---------+---------------+---------+-----------+----------+-------------------+ FV DistalFull                                                             +---------+---------------+---------+-----------+----------+-------------------+ PFV      Full                                                             +---------+---------------+---------+-----------+----------+-------------------+ POP                      Yes      Yes                  patent by color and                                                       Doppler             +---------+---------------+---------+-----------+----------+-------------------+ PTV      Full                                                             +---------+---------------+---------+-----------+----------+-------------------+  PERO     Full                                                             +---------+---------------+---------+-----------+----------+-------------------+     Summary: LEFT: - There is no evidence of deep vein thrombosis in the lower extremity. However, portions of this examination were limited- see technologist comments above.  *See table(s) above for measurements and observations. Electronically signed by Coral Else MD on 06/14/2020 at 7:58:01 PM.    Final      Assessment and Plan:   1. Questionable nonsustained VT/atrial flutter: Based on nursing documentation, patient had 12 beats run of nonsustained VT followed by short run of atrial flutter right after 8 PM yesterday.  Unfortunately when the patient transferred from the ED to the telemetry floor around 3 AM this morning, ED telemetry has been lost.  Talking with the patient, she denies any kind cardiac awareness of palpitation, recent chest pain or shortness of breath.  Looking at yesterday's telemetry strips, this appears to show sinus rhythm with flutter waves generated by motion artifact.  EKG also support this finding as well. Will review with MD.   2. Metabolic encephalopathy: Likely related to urinary tract infection.  She is alert and oriented x3 this morning and can carry normal conversation without any issue.  Encephalopathy has completely resolved  3. Urinary tract infection: Treatment per primary team  4. History of CVA: Occurred in 2019.  On aspirin at home.  Patient denies any usage of Plavix or clopidogrel  5. Hyperlipidemia: She  does remember she used to be on a cholesterol medication, will resume statin  6. Prediabetes: New diagnosis during this admission.  Patient denies any awareness of previous diabetes.  7. Elevated troponin: High-sensitivity troponin 21-->21-->24.  Serial troponin was flat and were right above the border, this is likely demand ischemia in the setting of metabolic encephalopathy today.  8. Falls: 2 episodes of falls recently, maybe related to encephalopathy.  Per patient, these two episodes are the only ones in the past year.  She denies any balance issue prior to the recent fall.   Risk Assessment/Risk Scores:        For questions or updates, please contact [facility] Please consult [email or web address] for contact info under    Signed, Azalee Course, [place]  06/15/2020 8:43 AM  Patient seen and examined and agree with Azalee Course, PA as detailed above.  In brief, the patient is a 70 year old female with history of prior CVA in 2019, and HLD who presented with AMS found to have UTI with course possibly complicated by NSVT and atrial flutter for which Cardiology has been consulted.  Unfortunately, we do not have access to the tele from the ED where the patient has been documented to have NSVT/flutter. Rhythm strips provided are consistent with artifact and no Aflutter/NSVT on the monitor since being admitted to the telemetry floor. Patient has no known history of cardiac disease and denies any palpitations, lightheadedness, SOB or dizziness. States she had a cardiac monitor after her stroke in 2019 (unknown duration) but did not show Afib. TTE currently pending.  Exam: GEN: No acute distress.   Neck: No JVD Cardiac: RRR, no murmurs, rubs, or gallops.  Respiratory: Clear to auscultation bilaterally. GI: Soft, nontender, non-distended  MS: No edema; No deformity. Neuro:  Somnolent but arousable; answering questions appropriately Psych: Normal affect   Plan: -Continue telemetry; if no recurrent  episodes of NSVT/flutter, will plan for 30day monitor on discharge -Follow-up TTE -No need for AC/rate control at this time as unable to confirm true Aflutter/NSVT -Continue ASA 81mg  daily; start crestor 20mg  daily given history of stroke -Management of UTI/AMS per primary team  , MD

## 2020-06-15 NOTE — Progress Notes (Signed)
PROGRESS NOTE    Patient: Kristy Jensen                            PCP: Patient, No Pcp Per                    DOB: 12-01-1950            DOA: 06/13/2020 VWU:[id number]             DOS: 06/15/2020, 9:47 AM   LOS: 2 days   Date of Service: The patient was seen and examined on 06/15/2020  PCP in [place] Dr. Marnee GuarneriBerkovich [phone] (provided by her son)  Subjective:    The patient was seen and examined this morning, stable no acute distress.  Denies any chest pain or shortness of breath Still complaining of leg pain. Alert oriented only x2.   Yesterday we received multiple phone calls regarding patient left leg pain, ultrasound and CT scan did not reveal any acute changes.  Also nursing staff reported run of V. tach, with a flutter.. Reviewing records EKG did not reveal any A-flutter, mildly elevated troponin at 24 She continued did not have any chest pain     Brief Narrative:   Kristy GandyVirginia Auble is a 70 y.o.f with PMH/o   CVA but otherwise no significant other known history who lives independently per family brought in secondary to altered mental status.   Patient's son is primary historian.  She apparently moved from [place] recently.  He moved her here because of increasing confusion.  He has no clear history of patient's medical problems and is not aware of the medications she takes.  Patient is able to say that she had a stroke in 2019.  She fell a few days ago and yesterday but reportedly fell again this time out of bed.  She has also been swelling herself.  Patient has no local primary caregiver.  She is therefore not able to give us adequate history regarding her current medical situation.    Patient being admitted to the hospital for further evaluation and treatment.  Father son she has also decreased oral intake.  Patient evaluated in the ER and was found to have UTI.   Suspicion for toxic metabolic encephalopathy therefore is made.    No prior history of dementia.  No head  injuries..  ED Course: vitals and LABs were with in normal limits.  Head CT without contrast is negative.  COVID-19 screen is negative.  Troponin is 21 and flat.  CT C-spine is also negative.  Chest x-ray and hip x-rays are at this point negative.   Patient is therefore being admitted based on urinalysis which shows WBC more than 50 many bacteria RBC 11-20 and large leukocytes.  It is also cloudy urine.   Appeared to have UTI.   Assessment & Plan:   Principal Problem:   Metabolic encephalopathy Active Problems:   CVA (cerebral vascular accident) (HCC)   Acute lower UTI   Hyperglycemia   Altered mental status   Toxic metabolic encephalopathy: -Remained hemodynamically stable  -Alert oriented x2, likely baseline Discussed with the patient's son after her stroke she has been slow cognition has been impaired  -Confusion has improved -May be complicated by urine tract infection -no sign of sepsis  (lactic acid 1.3, WBC 10.3, afebrile normotensive) -Possible underlying dementia-gradually getting worse complicated by UTI -Imaging of the head thus far within normal limits -Unable  to obtain MRI -patient seems to have a loop recorder in place -We will continue treating underlying UTI -Follow-up with labs including TSH, B12, ammonia level -Urine drug screen negative .  History of CVA:  -Apparently she is not taking any medication -We will initiate aspirin, evaluate if she can tolerate statins -We will check a fasting lipid panel-still pending -MRI-pending due to loop recorder in place (checking compatibility) Called her PCP in [place] left a message at Dr. Marnee Guarneri [phone] (provided by her son)   Acute cystitis -Apparently patient has a penicillin allergy -not clear  -Patient has been initiated on antibiotics fosfomycin -Urine/blood cultures >> no growth to date   ?Thrombocytopenia -On admission 49 this morning 266 -Resolved  Hypoalbuminemia, hypocalcemia -We  will monitorand  replete accordingly  Hyperglycemia:  CBG: 164, 138, 134 Noted hyperglycemia with no prior history of diabetes as far as we know.   Hemoglobin A1c: 6.0  Hypertension -We see no history of hypertension, not any medication -BP elevated 179/89, 146/61 - added Norvasc 5 mg Q daily  -Anticipating titrating medication -As needed IV hydralazine  Elevated troponin /run of V. Tach,???  Brief flutter -Was called by nursing staff for a brief V. tach questionable flutter yesterday evening -Remain flat at 21, 21, 24 -Denies any chest pain, no EKG changes -No changes on repeat EKG -Currently normal sinus rhythm -Cardiology consulted --appreciate evaluation -Continue to monitor on monitored bed   Left leg pain/hip pain -X-ray negative, CT -reviewed negative for any acute changes fractures Old inferior rami fracture -Ultrasound left lower extremity negative for DVT - As needed analgesics -Consulting PT for evaluation   ------------------------------------------------------------------------------------------------------------------------------------ Nutritional status:  The patient's BMI is: There is no height or weight on file to calculate BMI. I agree with the assessment and plan as outlined .Marland Kitchen  ----------------------------------------------------------------------------------------------------------------------------------- Cultures; Blood Cultures x 2 >> NGT Urine Culture  >>> NGT    Antimicrobials: Fosfomycin first dose 06/13/2020 Anticipating second dose 06/16/2020   Consultants: None   ------------------------------------------------------------------------------------------------------------------------------------  DVT prophylaxis:  Lovenox SQ Code Status:   Code Status: Full Code  Family Communication: No family member present at bedside - Callledl Mr. Jaquita Folds at [phone] updated  [place]P [place] Dr[place] Marnee Guarneri [phone] (called and left  a message) -Instructed RN to follow-up and obtain medical records.   Plan of care was discussed with the patient- ?  Understanding  -Advance care planning has been discussed.   Admission status:   Status is: Inpatient  Remains inpatient appropriate because:Inpatient level of care appropriate due to severity of illness   Dispo: The patient is from: Home              Anticipated d/c is to: Patient is from the independent living --the patient and her son wishes to  be moved to assisted living facility              Anticipated d/c date is: 3 days              Patient currently is not medically stable to d/c.   Difficult to place patient Yes      Level of care: Telemetry Medical   Procedures:   No admission procedures for hospital encounter.     Antimicrobials:  Anti-infectives (From admission, onward)   Start     Dose/Rate Route Frequency Ordered Stop   06/16/20 0000  fosfomycin (MONUROL) packet 3 g  Status:  Discontinued        3  g Oral  Once 06/14/20 1052 06/14/20 1129   06/13/20 1800  fosfomycin (MONUROL) packet 3 g        3 g Oral  Once 06/13/20 1754 06/13/20 2014       Medication:  . amLODipine  5 mg Oral Daily  . aspirin EC  81 mg Oral Daily  . insulin aspart  0-9 Units Subcutaneous TID WC    acetaminophen **OR** acetaminophen, hydrALAZINE, ondansetron **OR** ondansetron (ZOFRAN) IV   Objective:   Vitals:   06/15/20 0258 06/15/20 0624 06/15/20 0814 06/15/20 0900  BP: (!) 165/76 124/63 (!) 146/61   Pulse: 85 88 87   Resp: 16 19 20    Temp: 98.3 F (36.8 C) 98.4 F (36.9 C) 99.5 F (37.5 C) 98.8 F (37.1 C)  TempSrc: Oral Oral Oral   SpO2: 100% 98% 98%   Weight: 109.1 kg       Intake/Output Summary (Last 24 hours) at 06/15/2020 0947 Last data filed at 06/15/2020 0300 Gross per 24 hour  Intake 1563.33 ml  Output --  Net 1563.33 ml   Filed Weights   06/15/20 0258  Weight: 109.1 kg     Examination:      Physical Exam:   General:   Alert, oriented, cooperative, no distress;   HEENT:  Normocephalic, PERRL, otherwise with in Normal limits   Neuro:  CNII-XII intact. , normal motor and sensation, reflexes intact   Lungs:   Clear to auscultation BL, Respirations unlabored, no wheezes / crackles  Cardio:    S1/S2, RRR, No murmure, No Rubs or Gallops   Abdomen:   Soft, non-tender, bowel sounds active all four quadrants,  no guarding or peritoneal signs.  Muscular skeletal:   Left leg/hip discomfort, pain therefore limited range of motion, generalized weaknesses -unable to ambulate without assist Limited exam - in bed, able to move all 4 extremities, ,  2+ pulses,  symmetric, No pitting edema  Skin:  Dry, warm to touch, negative for any Rashes,  Wounds: Please see nursing documentation            ------------------------------------------------------------------------------------------------------------------------------------------    LABs:  CBC Latest Ref Rng & Units 06/15/2020 06/14/2020 06/13/2020  WBC 4.0 - 10.5 K/uL 10.1 10.3 -  Hemoglobin 12.0 - 15.0 g/dL 11.3(L) 11.8(L) 11.2(L)  Hematocrit 36.0 - 46.0 % 36.7 35.8(L) 33.0(L)  Platelets 150 - 400 K/uL 250 266 -   CMP Latest Ref Rng & Units 06/15/2020 06/14/2020 06/13/2020  Glucose 70 - 99 mg/dL 08/11/2020) 828(Q) 337(O)  BUN 8 - 23 mg/dL 14 19 451(Q)  Creatinine 0.44 - 1.00 mg/dL 60(Q 7.99) 8.72(J  Sodium 135 - 145 mmol/L 137 136 140  Potassium 3.5 - 5.1 mmol/L 4.0 3.7 4.1  Chloride 98 - 111 mmol/L 107 105 111  CO2 22 - 32 mmol/L 20(L) 19(L) -  Calcium 8.9 - 10.3 mg/dL 5.87) 9.1 -  Total Protein 6.5 - 8.1 g/dL - 6.4(L) -  Total Bilirubin 0.3 - 1.2 mg/dL - 1.0 -  Alkaline Phos 38 - 126 U/L - 46 -  AST 15 - 41 U/L - 15 -  ALT 0 - 44 U/L - 15 -       Micro Results Recent Results (from the past 240 hour(s))  Culture, blood (routine x 2)     Status: None (Preliminary result)   Collection Time: 06/13/20  6:52 PM   Specimen: BLOOD RIGHT FOREARM  Result Value Ref  Range Status   Specimen Description BLOOD RIGHT FOREARM  Final  Special Requests   Final    BOTTLES DRAWN AEROBIC AND ANAEROBIC Blood Culture results may not be optimal due to an inadequate volume of blood received in culture bottles   Culture   Final    NO GROWTH < 24 HOURS Performed at [facility], [address], [place], [place] [address]    Report Status PENDING  Incomplete  Culture, blood (routine x 2)     Status: None (Preliminary result)   Collection Time: 06/13/20  7:10 PM   Specimen: BLOOD  Result Value Ref Range Status   Specimen Description BLOOD RIGHT ANTECUBITAL  Final   Special Requests   Final    BOTTLES DRAWN AEROBIC AND ANAEROBIC Blood Culture adequate volume   Culture   Final    NO GROWTH < 24 HOURS Performed at [facility], [address] [place], [place] [address]    Report Status PENDING  Incomplete  Resp Panel by RT-PCR (Flu A&B, Covid) Nasopharyngeal Swab     Status: None   Collection Time: 06/13/20  8:20 PM   Specimen: Nasopharyngeal Swab; Nasopharyngeal(NP) swabs in vial transport medium  Result Value Ref Range Status   SARS Coronavirus 2 by RT PCR NEGATIVE NEGATIVE Final    Comment: (NOTE) SARS-CoV-2 target nucleic acids are NOT DETECTED.  The SARS-CoV-2 RNA is generally detectable in upper respiratory specimens during the acute phase of infection. The lowest concentration of SARS-CoV-2 viral copies this assay can detect is 138 copies/mL. A negative result does not preclude SARS-Cov-2 infection and should not be used as the sole basis for treatment or other patient management decisions. A negative result may occur with  improper specimen collection/handling, submission of specimen other than nasopharyngeal swab, presence of viral mutation(s) within the areas targeted by this assay, and inadequate number of viral copies(<138 copies/mL). A negative result must be combined with clinical observations, patient history, and  epidemiological information. The expected result is Negative.  Fact Sheet for Patients:  BloggerCourse.com  Fact Sheet for Healthcare Providers:  SeriousBroker.it  This test is no t yet approved or cleared by the [place] FDA and  has been authorized for detection and/or diagnosis of SARS-CoV-2 by FDA under an Emergency Use Authorization (EUA). This EUA will remain  in effect (meaning this test can be used) for the duration of the COVID-19 declaration under Section 564(b)(1) of the Act, 21 U.S.C.section 360bbb-3(b)(1), unless the authorization is terminated  or revoked sooner.       Influenza A by PCR NEGATIVE NEGATIVE Final   Influenza B by PCR NEGATIVE NEGATIVE Final    Comment: (NOTE) The Xpert Xpress SARS-CoV-2/FLU/RSV plus assay is intended as an aid in the diagnosis of influenza from Nasopharyngeal swab specimens and should not be used as a sole basis for treatment. Nasal washings and aspirates are unacceptable for Xpert Xpress SARS-CoV-2/FLU/RSV testing.  Fact Sheet for Patients: BloggerCourse.com  Fact Sheet for Healthcare Providers: SeriousBroker.it  This test is not yet approved or cleared by the [place] FDA and has been authorized for detection and/or diagnosis of SARS-CoV-2 by FDA under an Emergency Use Authorization (EUA). This EUA will remain in effect (meaning this test can be used) for the duration of the COVID-19 declaration under Section 564(b)(1) of the Act, 21 U.S.C. section 360bbb-3(b)(1), unless the authorization is terminated or revoked.  Performed at [facility], [address], [place], [place] [address]     Radiology Reports DG Chest 2 View  Result Date: 06/13/2020  CLINICAL DATA:  Status post fall. EXAM: CHEST - 2 VIEW COMPARISON:  None. FINDINGS: The heart size and mediastinal contours are within normal limits. No pneumothorax  or pleural effusion is noted. Mild bibasilar subsegmental atelectasis or scarring is noted. The visualized skeletal structures are unremarkable. IMPRESSION: Mild bibasilar subsegmental atelectasis or scarring. Electronically Signed   By: Lupita Raider M.D.   On: 06/13/2020 16:42   CT Head Wo Contrast  Result Date: 06/13/2020 CLINICAL DATA:  Head trauma EXAM: CT HEAD WITHOUT CONTRAST CT CERVICAL SPINE WITHOUT CONTRAST TECHNIQUE: Multidetector CT imaging of the head and cervical spine was performed following the standard protocol without intravenous contrast. Multiplanar CT image reconstructions of the cervical spine were also generated. COMPARISON:  None. FINDINGS: CT HEAD FINDINGS Brain: Ventricle size normal. Mild atrophy. Chronic infarct left parietal lobe with encephalomalacia and mild calcification. Negative for acute infarct, hemorrhage, mass. Vascular: Negative for hyperdense vessel Skull: Negative Sinuses/Orbits: Paranasal sinuses clear.  Negative orbit Other: CT CERVICAL SPINE FINDINGS Alignment: Slight anterolisthesis C4-5.  Mild cervical kyphosis. Skull base and vertebrae: Negative for fracture. Soft tissues and spinal canal: Hypoplastic left thyroid. 2 cm nodule right thyroid. No enlarged lymph nodes in the neck Disc levels: Disc degeneration and spurring C4 through C7. This is most severe at C5-6 with diffuse uncinate spurring as well as a calcified extruded disc fragment extending caudally. There is moderate spinal stenosis. Severe right foraminal encroachment and moderate left foraminal encroachment at C5-6. Upper chest: Lung apices clear bilaterally Other: None IMPRESSION: 1. No acute intracranial abnormality 2. Negative for cervical spine fracture 3. Cervical spine degenerative change with moderate spinal stenosis at C5-6. Electronically Signed   By: Marlan Palau M.D.   On: 06/13/2020 16:01   CT Cervical Spine Wo Contrast  Result Date: 06/13/2020 CLINICAL DATA:  Head trauma EXAM: CT HEAD  WITHOUT CONTRAST CT CERVICAL SPINE WITHOUT CONTRAST TECHNIQUE: Multidetector CT imaging of the head and cervical spine was performed following the standard protocol without intravenous contrast. Multiplanar CT image reconstructions of the cervical spine were also generated. COMPARISON:  None. FINDINGS: CT HEAD FINDINGS Brain: Ventricle size normal. Mild atrophy. Chronic infarct left parietal lobe with encephalomalacia and mild calcification. Negative for acute infarct, hemorrhage, mass. Vascular: Negative for hyperdense vessel Skull: Negative Sinuses/Orbits: Paranasal sinuses clear.  Negative orbit Other: CT CERVICAL SPINE FINDINGS Alignment: Slight anterolisthesis C4-5.  Mild cervical kyphosis. Skull base and vertebrae: Negative for fracture. Soft tissues and spinal canal: Hypoplastic left thyroid. 2 cm nodule right thyroid. No enlarged lymph nodes in the neck Disc levels: Disc degeneration and spurring C4 through C7. This is most severe at C5-6 with diffuse uncinate spurring as well as a calcified extruded disc fragment extending caudally. There is moderate spinal stenosis. Severe right foraminal encroachment and moderate left foraminal encroachment at C5-6. Upper chest: Lung apices clear bilaterally Other: None IMPRESSION: 1. No acute intracranial abnormality 2. Negative for cervical spine fracture 3. Cervical spine degenerative change with moderate spinal stenosis at C5-6. Electronically Signed   By: Marlan Palau M.D.   On: 06/13/2020 16:01   CT HIP LEFT WO CONTRAST  Result Date: 06/14/2020 CLINICAL DATA:  Chronic hip pain on. EXAM: CT OF THE LEFT HIP WITHOUT CONTRAST TECHNIQUE: Multidetector CT imaging of the left hip was performed according to the standard protocol. Multiplanar CT image reconstructions were also generated. COMPARISON:  None. 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 There are degenerative changes of  the left hip  without evidence for an acute displaced fracture or dislocation. There are advanced degenerative changes of the lower lumbar spine. Ligaments Suboptimally assessed by CT. Muscles and Tendons No hematoma. Soft tissues The visualized urinary bladder is unremarkable. There is scattered sigmoid diverticula without CT evidence for diverticulitis. There are atherosclerotic changes of the visualized abdominal aorta. IMPRESSION: 1. 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 2. Degenerative changes of the left hip without evidence for an acute displaced fracture or dislocation. 3. 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 Aortic Atherosclerosis (ICD10-I70.0). Electronically Signed   By: Katherine Mantle M.D.   On: 06/14/2020 20:55   DG Knee Complete 4 Views Left  Result Date: 06/13/2020 CLINICAL DATA:  Left knee pain after fall several days ago. EXAM: LEFT KNEE - COMPLETE 4+ VIEW COMPARISON:  None. FINDINGS: No evidence of fracture, dislocation, or joint effusion. Mild narrowing of medial and lateral joint spaces are noted with osteophyte formation. Severe narrowing of patellofemoral space is noted. Soft tissues are unremarkable. IMPRESSION: Tricompartmental degenerative joint disease. No acute abnormality seen in the left knee. Electronically Signed   By: Lupita Raider M.D.   On: 06/13/2020 16:43   DG Hip Unilat W or Wo Pelvis 2-3 Views Left  Result Date: 06/13/2020 CLINICAL DATA:  Acute left hip pain after fall several days ago. EXAM: DG HIP (WITH OR WITHOUT PELVIS) 2-3V LEFT COMPARISON:  None. FINDINGS: There is no evidence of hip fracture or dislocation. There is no evidence of arthropathy or other focal bone abnormality. IMPRESSION: Negative. Electronically Signed   By: Lupita Raider M.D.   On: 06/13/2020 16:44   VAS [place] LOWER EXTREMITY VENOUS (DVT)  Result Date: 06/14/2020  Lower Venous DVT Study Indications: Swelling.  Limitations: Patient's inability to  move left leg. Comparison Study: No prior study Performing Technologist: Sherren Kerns RVS  Examination Guidelines: A complete evaluation includes B-mode imaging, spectral Doppler, color Doppler, and power Doppler as needed of all accessible portions of each vessel. Bilateral testing is considered an integral part of a complete examination. Limited examinations for reoccurring indications may be performed as noted. The reflux portion of the exam is performed with the patient in reverse Trendelenburg.  Right Technical Findings: Right leg not evaluated.  +---------+---------------+---------+-----------+----------+-------------------+ LEFT     CompressibilityPhasicitySpontaneityPropertiesThrombus Aging      +---------+---------------+---------+-----------+----------+-------------------+ CFV      Full           Yes      Yes                                      +---------+---------------+---------+-----------+----------+-------------------+ SFJ      Full                                                             +---------+---------------+---------+-----------+----------+-------------------+ FV Prox  Full                                                             +---------+---------------+---------+-----------+----------+-------------------+  FV Mid   Full                                                             +---------+---------------+---------+-----------+----------+-------------------+ FV DistalFull                                                             +---------+---------------+---------+-----------+----------+-------------------+ PFV      Full                                                             +---------+---------------+---------+-----------+----------+-------------------+ POP                     Yes      Yes                  patent by color and                                                       Doppler              +---------+---------------+---------+-----------+----------+-------------------+ PTV      Full                                                             +---------+---------------+---------+-----------+----------+-------------------+ PERO     Full                                                             +---------+---------------+---------+-----------+----------+-------------------+     Summary: LEFT: - There is no evidence of deep vein thrombosis in the lower extremity. However, portions of this examination were limited- see technologist comments above.  *See table(s) above for measurements and observations. Electronically signed by Coral Else MD on 06/14/2020 at 7:58:01 PM.    Final     SIGNED: Kendell Bane, MD, FHM. [facility],  Pager (please use [email or web address] to page/text) Please use Epic Secure Chat for non-urgent communication (7AM-7PM)  If 7PM-7AM, please contact night-coverage [email or web address], 06/15/2020, 9:47 AM

## 2020-06-15 NOTE — Progress Notes (Signed)
Pt received from ED, AO x2. Denies any pain. Vitals stable at the time of arrival. CHG bath completed, connected to tele and CCMD notified. Oriented pt to room and call bell system. Call bell within reach, bed alarm on, floor mat in place. Will continue to monitor.

## 2020-06-16 ENCOUNTER — Other Ambulatory Visit: Payer: Self-pay | Admitting: Cardiology

## 2020-06-16 DIAGNOSIS — I499 Cardiac arrhythmia, unspecified: Secondary | ICD-10-CM

## 2020-06-16 DIAGNOSIS — G9341 Metabolic encephalopathy: Principal | ICD-10-CM

## 2020-06-16 DIAGNOSIS — I4729 Other ventricular tachycardia: Secondary | ICD-10-CM

## 2020-06-16 DIAGNOSIS — I472 Ventricular tachycardia: Secondary | ICD-10-CM

## 2020-06-16 LAB — BASIC METABOLIC PANEL
Anion gap: 10 (ref 5–15)
BUN: 16 mg/dL (ref 8–23)
CO2: 20 mmol/L — ABNORMAL LOW (ref 22–32)
Calcium: 8.6 mg/dL — ABNORMAL LOW (ref 8.9–10.3)
Chloride: 107 mmol/L (ref 98–111)
Creatinine, Ser: 0.94 mg/dL (ref 0.44–1.00)
GFR, Estimated: >60 mL/min (ref >60)
Glucose, Bld: 108 mg/dL — ABNORMAL HIGH (ref 70–99)
Potassium: 3.9 mmol/L (ref 3.5–5.1)
Sodium: 137 mmol/L (ref 135–145)

## 2020-06-16 LAB — CBC
HCT: 32 % — ABNORMAL LOW (ref 36.0–46.0)
Hemoglobin: 10.8 g/dL — ABNORMAL LOW (ref 12.0–15.0)
MCH: 28.2 pg (ref 26.0–34.0)
MCHC: 33.8 g/dL (ref 30.0–36.0)
MCV: 83.6 fL (ref 80.0–100.0)
Platelets: 251 10*3/uL (ref 150–400)
RBC: 3.83 MIL/uL — ABNORMAL LOW (ref 3.87–5.11)
RDW: 13.9 % (ref 11.5–15.5)
WBC: 9 10*3/uL (ref 4.0–10.5)
nRBC: 0 % (ref 0.0–0.2)

## 2020-06-16 LAB — GLUCOSE, CAPILLARY
Glucose-Capillary: 109 mg/dL — ABNORMAL HIGH (ref 70–99)
Glucose-Capillary: 118 mg/dL — ABNORMAL HIGH (ref 70–99)
Glucose-Capillary: 154 mg/dL — ABNORMAL HIGH (ref 70–99)
Glucose-Capillary: 122 mg/dL — ABNORMAL HIGH (ref 70–99)

## 2020-06-16 NOTE — Discharge Instructions (Signed)
Dr. Devin Going office will arrange a monitor for you to wear, will probably come in the mail.

## 2020-06-16 NOTE — Progress Notes (Addendum)
Physical Therapy Treatment Patient Details Name: Kristy Jensen MRN: [id number] DOB: 1950-07-13 Today's Date: 06/16/2020    History of Present Illness 70 yo female with onset of mult falls since recently moving from [place] was brought to hosp.  Pt has increased confusion prior to move, new UTI and metabolic encephalopathy. Additionally has hyperglycemia, run of v-tach.  PMHx:  CVA    PT Comments    Pt received in supine, agreeable to therapy session with encouragement and with fair participation and good tolerance for mobility. Pt limited due to cognition but with improved participation compared with previous session, follow ~50% of 1-step command and continues to report "stiffness"/pain in L knee which seems worst with knee flexion (able to perform hip abduction without significant increase in pain). Pt performed supine/seated BLE A/AAROM therapeutic exercises, needing AA mostly on LLE and mostly AROM on RLE and given HEP handout to perform daily as able, will need reinforcement due to cognitive deficit. Pt able to tolerate sitting EOB ~6 minutes this date, an improvement from previous session, but unable to follow commands to stand or scoot to drop arm chair this date. Will plan to assess sit<>stand with Stedy next session if pt able to tolerate within L knee pain. Pt continues to benefit from PT services to progress toward functional mobility goals. Continue to recommend SNF.   Follow Up Recommendations  SNF;Supervision/Assistance - 24 hour     Equipment Recommendations  None recommended by PT    Recommendations for Other Services       Precautions / Restrictions Precautions Precautions: Fall Restrictions Weight Bearing Restrictions: No    Mobility  Bed Mobility Overal bed mobility: Needs Assistance Bed Mobility: Rolling;Sidelying to Sit;Sit to Supine Rolling: Max assist Sidelying to sit: Total assist;HOB elevated   Sit to supine: Max assist   General bed mobility comments: pt  verbalized fear of falling once upright, however with slow 1-step commands able to reach upright with totalA and HOB raised fully, max to totalA for bil hip translation to EOB with transfer pads    Transfers Overall transfer level: Needs assistance   Transfers: Lateral/Scoot Transfers          Lateral/Scoot Transfers: Total assist;From elevated surface General transfer comment: attempted to have pt perform lateral scoot toward [facility] prior to return to supine however pt unable to perform 2/2 cognition, totalA with transfer pad needed to achieve movement  Ambulation/Gait             General Gait Details: unable to stand 2/2 cognition   Stairs             Wheelchair Mobility    Modified Rankin (Stroke Patients Only)       Balance Overall balance assessment: Needs assistance;History of Falls Sitting-balance support: Bilateral upper extremity supported;Feet supported Sitting balance-Leahy Scale: Poor Sitting balance - Comments: pt varying minA to maxA for seated balance, improves with seated time but pt needing cues for use of R rail to steady herself; verbalized fear of falls       Standing balance comment: pt unable to perform                            Cognition Arousal/Alertness: Awake/alert (drowsy initially) Behavior During Therapy: Flat affect Overall Cognitive Status: No family/caregiver present to determine baseline cognitive functioning  General Comments: pt with limited ability following one step commands this session. Pt following one step commands ~50% of the time with max multimodal cues. Pt oriented to place and current holiday, unable to state year/month however. Sometimes gives nonsensical answers to basic questions (when asked about what is current year, states something about daughter)      Exercises Other Exercises Other Exercises: supine BLE AAROM: ankle pumps, heel slides, hip  abduction, quad sets; supine BUE AROM: shoulder flexion 1x10 reps ea    General Comments  Pt BP 172/78 (107) supine, RR 20-29 during seated therex, HR 84 bpm, no dizziness reported while seated.      Pertinent Vitals/Pain Pain Assessment: Faces Faces Pain Scale: Hurts even more Pain Location: LLE (pt seems most painful at L knee) Pain Descriptors / Indicators: Grimacing;Guarding Pain Intervention(s): Monitored during session;Repositioned    Home Living                      Prior Function            PT Goals (current goals can now be found in the care plan section) Acute Rehab PT Goals Patient Stated Goal: none stated PT Goal Formulation: Patient unable to participate in goal setting Time For Goal Achievement: 06/28/20 Potential to Achieve Goals: Fair Progress towards PT goals: Progressing toward goals    Frequency    Min 3X/week      PT Plan Current plan remains appropriate    Co-evaluation              AM-PAC PT "6 Clicks" Mobility   Outcome Measure  Help needed turning from your back to your side while in a flat bed without using bedrails?: A Lot Help needed moving from lying on your back to sitting on the side of a flat bed without using bedrails?: A Lot Help needed moving to and from a bed to a chair (including a wheelchair)?: Total Help needed standing up from a chair using your arms (e.g., wheelchair or bedside chair)?: Total Help needed to walk in hospital room?: Total Help needed climbing 3-5 steps with a railing? : Total 6 Click Score: 8    End of Session Equipment Utilized During Treatment:  (transfer pads) Activity Tolerance: Patient tolerated treatment well Patient left: in bed;with call bell/phone within reach;with bed alarm set;with SCD's reapplied (bed in chair position and set up to eat lunch) Nurse Communication: Mobility status (pt would need lift for OOB) PT Visit Diagnosis: Other abnormalities of gait and mobility  (R26.89);Difficulty in walking, not elsewhere classified (R26.2);Other (comment)     Time: 1342-1410 PT Time Calculation (min) (ACUTE ONLY): 28 min  Charges:  $Therapeutic Exercise: 8-22 mins $Therapeutic Activity: 8-22 mins                     Aizley Stenseth P., PTA Acute Rehabilitation Services Pager: [phone] Office: [phone]   [place] 06/16/2020, 3:46 PM

## 2020-06-16 NOTE — Progress Notes (Signed)
Called the floor to see if they had heard anything from the cardiologist in [place] about patient's loop recorder.  They have not.  Still waiting on this information to clear the patient for MRI safety.

## 2020-06-16 NOTE — Progress Notes (Signed)
PROGRESS NOTE    Patient: Kristy Jensen                            PCP: Patient, No Pcp Per                    DOB: 07-Dec-1950            DOA: 06/13/2020 ZOX:[id number]             DOS: 06/16/2020, 1:40 PM   LOS: 3 days   Date of Service: The patient was seen and examined on 06/16/2020  PCP in [place] Dr. Marnee Guarneri [phone] (provided by her son)  Subjective:   Patient seen and examined.  Initially she had no complaints however when I try to remove the sheets from her legs, she complained of pain in the left leg.  There was no obvious skin abnormality.  Brief Narrative:   Kristy Jensen is a 70 y.o.f with PMH/o   CVA but otherwise no significant other known history who lives independently per family brought in secondary to altered mental status.   Patient's son is primary historian.  She apparently moved from [place] recently.  He moved her here because of increasing confusion.  He has no clear history of patient's medical problems and is not aware of the medications she takes.  Patient is able to say that she had a stroke in 2019.  She fell a few days ago and yesterday but reportedly fell again this time out of bed.  She has also been swelling herself.  Patient has no local primary caregiver.  She is therefore not able to give [place] adequate history regarding her current medical situation.    Patient being admitted to the hospital for further evaluation and treatment.  Father son she has also decreased oral intake.  Patient evaluated in the ER and was found to have UTI.   Suspicion for toxic metabolic encephalopathy therefore is made.    No prior history of dementia.  No head injuries..  ED Course: vitals and LABs were with in normal limits.  Head CT without contrast is negative.  COVID-19 screen is negative.  Troponin is 21 and flat.  CT C-spine is also negative.  Chest x-ray and hip x-rays are at this point negative.   Patient is therefore being admitted based on urinalysis which  shows WBC more than 50 many bacteria RBC 11-20 and large leukocytes.  It is also cloudy urine.   Appeared to have UTI.   Assessment & Plan:   Principal Problem:   Metabolic encephalopathy Active Problems:   CVA (cerebral vascular accident) (HCC)   Acute lower UTI   Hyperglycemia   Altered mental status   Toxic metabolic encephalopathy: -Remained hemodynamically stable  -Alert oriented x2, likely baseline Discussed with the patient's son after her stroke she has been slow cognition has been impaired.  -Confusion has improved -May be complicated by urine tract infection -no sign of sepsis  (lactic acid 1.3, WBC 10.3, afebrile normotensive) -Possible underlying dementia-gradually getting worse complicated by UTI -Imaging of the head thus far within normal limits -Unable to obtain MRI -patient seems to have a loop recorder in place -We will continue treating underlying UTI -Follow-up with labs including TSH, B12, ammonia level -Urine drug screen negative .  History of CVA:  -Apparently she is not taking any medication.  She was started on aspirin and statin by cardiology. -MRI-pending  due to loop recorder in place (checking compatibility) Prior hospitalist called her PCP in [place] left a message at Dr. Marnee Guarneri [phone] (provided by her son)   Acute cystitis -Apparently patient has a penicillin allergy -not clear  -Patient has been initiated on antibiotics fosfomycin -Urine/blood cultures >> no growth to date   ?Thrombocytopenia -On admission 49 this morning 266 -Resolved  Hypoalbuminemia, hypocalcemia -We will monitorand  replete accordingly  Hyperglycemia:  CBG: 164, 138, 134 Noted hyperglycemia with no prior history of diabetes as far as we know.   Hemoglobin A1c: 6.0  Hypertension -We see no history of hypertension, not any medication Was added 5 mg of amlodipine here.  Blood pressure fairly stable.  Continue this and as needed  hydralazine.  Elevated troponin /run of V. Tach,???  Brief flutter -Troponins remain flat at 21, 21, 24 -Denies any chest pain, no EKG changes Per cardiology, there are no strips available which show any evidence of atrial flutter. -Currently normal sinus rhythm -Cardiology consulted --appreciate evaluation.  No indication of rate control medications. -Continue to monitor on monitored bed   Left leg pain/hip pain -X-ray negative, CT -reviewed negative for any acute changes fractures Old inferior rami fracture -Ultrasound left lower extremity negative for DVT - As needed analgesics PT on board.  Recommendations for SNF.   ------------------------------------------------------------------------------------------------------------------------------------ Nutritional status:  The patient's BMI is: There is no height or weight on file to calculate BMI. I agree with the assessment and plan as outlined .Marland Kitchen  ----------------------------------------------------------------------------------------------------------------------------------- Cultures; Blood Cultures x 2 >> NGT Urine Culture  >>> NGT    Antimicrobials: Fosfomycin first dose 06/13/2020 Anticipating second dose 06/16/2020   Consultants: None   ------------------------------------------------------------------------------------------------------------------------------------  DVT prophylaxis:  Lovenox SQ Code Status:   Code Status: Full Code  Family Communication: No family member present at bedsid  Admission status:   Status is: Inpatient  Remains inpatient appropriate because:Inpatient level of care appropriate due to severity of illness   Dispo: The patient is from: Home              Anticipated d/c is to: SNF.  Son in agreement according to [facility] note.              Anticipated d/c date is: 1 to 2 days              Patient currently is medically stable to d/c.   Difficult to place patient yes   Level of care:  Telemetry Medical   Procedures:   No admission procedures for hospital encounter.     Antimicrobials:  Anti-infectives (From admission, onward)   Start     Dose/Rate Route Frequency Ordered Stop   06/16/20 0000  fosfomycin (MONUROL) packet 3 g  Status:  Discontinued        3 g Oral  Once 06/14/20 1052 06/14/20 1129   06/13/20 1800  fosfomycin (MONUROL) packet 3 g        3 g Oral  Once 06/13/20 1754 06/13/20 2014       Medication:  . amLODipine  5 mg Oral Daily  . aspirin EC  81 mg Oral Daily  . insulin aspart  0-9 Units Subcutaneous TID WC  . rosuvastatin  20 mg Oral Daily    acetaminophen **OR** acetaminophen, hydrALAZINE, ondansetron **OR** ondansetron (ZOFRAN) IV   Objective:   Vitals:   06/15/20 2334 06/16/20 0426 06/16/20 0750 06/16/20 1105  BP: (!) 159/68 (!) 181/76 (!) 166/78 (!) 157/67  Pulse: 96 90  80 76  Resp: 19 19 18 20   Temp: 99.3 F (37.4 C) 97.7 F (36.5 C) 97.7 F (36.5 C) 98.5 F (36.9 C)  TempSrc: Oral Oral Oral Oral  SpO2: 97% 97% 97% 96%  Weight:  116.1 kg      Intake/Output Summary (Last 24 hours) at 06/16/2020 1340 Last data filed at 06/15/2020 1816 Gross per 24 hour  Intake --  Output 475 ml  Net -475 ml   Filed Weights   06/15/20 0258 06/16/20 0426  Weight: 109.1 kg 116.1 kg     Examination:   General exam: Appears calm and comfortable  Respiratory system: Clear to auscultation. Respiratory effort normal. Cardiovascular system: S1 & S2 heard, RRR. No JVD, murmurs, rubs, gallops or clicks. No pedal edema. Gastrointestinal system: Abdomen is nondistended, soft and nontender. No organomegaly or masses felt. Normal bowel sounds heard. Central nervous system: Alert and oriented x2. No focal neurological deficits. Extremities: Symmetric 5 x 5 power. Skin: No rashes, lesions or ulcers.  Psychiatry: Judgement and insight appear  poor  ------------------------------------------------------------------------------------------------------------------------------------------    LABs:  CBC Latest Ref Rng & Units 06/16/2020 06/15/2020 06/14/2020  WBC 4.0 - 10.5 K/uL 9.0 10.1 10.3  Hemoglobin 12.0 - 15.0 g/dL 10.8(L) 11.3(L) 11.8(L)  Hematocrit 36.0 - 46.0 % 32.0(L) 36.7 35.8(L)  Platelets 150 - 400 K/uL 251 250 266   CMP Latest Ref Rng & Units 06/16/2020 06/15/2020 06/14/2020  Glucose 70 - 99 mg/dL 161(W108(H) 960(A116(H) 540(J134(H)  BUN 8 - 23 mg/dL 16 14 19   Creatinine 0.44 - 1.00 mg/dL 8.110.94 9.140.92 7.82(N1.10(H)  Sodium 135 - 145 mmol/L 137 137 136  Potassium 3.5 - 5.1 mmol/L 3.9 4.0 3.7  Chloride 98 - 111 mmol/L 107 107 105  CO2 22 - 32 mmol/L 20(L) 20(L) 19(L)  Calcium 8.9 - 10.3 mg/dL 5.6(O8.6(L) 1.3(Y8.7(L) 9.1  Total Protein 6.5 - 8.1 g/dL - - 6.4(L)  Total Bilirubin 0.3 - 1.2 mg/dL - - 1.0  Alkaline Phos 38 - 126 U/L - - 46  AST 15 - 41 U/L - - 15  ALT 0 - 44 U/L - - 15       Micro Results Recent Results (from the past 240 hour(s))  Culture, blood (routine x 2)     Status: None (Preliminary result)   Collection Time: 06/13/20  6:52 PM   Specimen: BLOOD RIGHT FOREARM  Result Value Ref Range Status   Specimen Description BLOOD RIGHT FOREARM  Final   Special Requests   Final    BOTTLES DRAWN AEROBIC AND ANAEROBIC Blood Culture results may not be optimal due to an inadequate volume of blood received in culture bottles   Culture   Final    NO GROWTH 2 DAYS Performed at [facility], [address] [place], [place] [address]    Report Status PENDING  Incomplete  Culture, blood (routine x 2)     Status: None (Preliminary result)   Collection Time: 06/13/20  7:10 PM   Specimen: BLOOD  Result Value Ref Range Status   Specimen Description BLOOD RIGHT ANTECUBITAL  Final   Special Requests   Final    BOTTLES DRAWN AEROBIC AND ANAEROBIC Blood Culture adequate volume   Culture   Final    NO GROWTH 2 DAYS Performed at [facility], [address] [place], [place] [address]    Report Status PENDING  Incomplete  Resp Panel by RT-PCR (Flu A&B, Covid) Nasopharyngeal Swab     Status: None  Collection Time: 06/13/20  8:20 PM   Specimen: Nasopharyngeal Swab; Nasopharyngeal(NP) swabs in vial transport medium  Result Value Ref Range Status   SARS Coronavirus 2 by RT PCR NEGATIVE NEGATIVE Final    Comment: (NOTE) SARS-CoV-2 target nucleic acids are NOT DETECTED.  The SARS-CoV-2 RNA is generally detectable in upper respiratory specimens during the acute phase of infection. The lowest concentration of SARS-CoV-2 viral copies this assay can detect is 138 copies/mL. A negative result does not preclude SARS-Cov-2 infection and should not be used as the sole basis for treatment or other patient management decisions. A negative result may occur with  improper specimen collection/handling, submission of specimen other than nasopharyngeal swab, presence of viral mutation(s) within the areas targeted by this assay, and inadequate number of viral copies(<138 copies/mL). A negative result must be combined with clinical observations, patient history, and epidemiological information. The expected result is Negative.  Fact Sheet for Patients:  BloggerCourse.com  Fact Sheet for Healthcare Providers:  SeriousBroker.it  This test is no t yet approved or cleared by the [place] FDA and  has been authorized for detection and/or diagnosis of SARS-CoV-2 by FDA under an Emergency Use Authorization (EUA). This EUA will remain  in effect (meaning this test can be used) for the duration of the COVID-19 declaration under Section 564(b)(1) of the Act, 21 U.S.C.section 360bbb-3(b)(1), unless the authorization is terminated  or revoked sooner.       Influenza A by PCR NEGATIVE NEGATIVE Final   Influenza B by PCR NEGATIVE NEGATIVE Final    Comment: (NOTE) The Xpert Xpress  SARS-CoV-2/FLU/RSV plus assay is intended as an aid in the diagnosis of influenza from Nasopharyngeal swab specimens and should not be used as a sole basis for treatment. Nasal washings and aspirates are unacceptable for Xpert Xpress SARS-CoV-2/FLU/RSV testing.  Fact Sheet for Patients: BloggerCourse.com  Fact Sheet for Healthcare Providers: SeriousBroker.it  This test is not yet approved or cleared by the [place] FDA and has been authorized for detection and/or diagnosis of SARS-CoV-2 by FDA under an Emergency Use Authorization (EUA). This EUA will remain in effect (meaning this test can be used) for the duration of the COVID-19 declaration under Section 564(b)(1) of the Act, 21 U.S.C. section 360bbb-3(b)(1), unless the authorization is terminated or revoked.  Performed at [facility], [address], [place], [place] [address]     Radiology Reports DG Chest 2 View  Result Date: 06/13/2020 CLINICAL DATA:  Status post fall. EXAM: CHEST - 2 VIEW COMPARISON:  None. FINDINGS: The heart size and mediastinal contours are within normal limits. No pneumothorax or pleural effusion is noted. Mild bibasilar subsegmental atelectasis or scarring is noted. The visualized skeletal structures are unremarkable. IMPRESSION: Mild bibasilar subsegmental atelectasis or scarring. Electronically Signed   By: Lupita Raider M.D.   On: 06/13/2020 16:42   CT Head Wo Contrast  Result Date: 06/13/2020 CLINICAL DATA:  Head trauma EXAM: CT HEAD WITHOUT CONTRAST CT CERVICAL SPINE WITHOUT CONTRAST TECHNIQUE: Multidetector CT imaging of the head and cervical spine was performed following the standard protocol without intravenous contrast. Multiplanar CT image reconstructions of the cervical spine were also generated. COMPARISON:  None. FINDINGS: CT HEAD FINDINGS Brain: Ventricle size normal. Mild atrophy. Chronic infarct left parietal lobe with  encephalomalacia and mild calcification. Negative for acute infarct, hemorrhage, mass. Vascular: Negative for hyperdense vessel Skull: Negative Sinuses/Orbits: Paranasal sinuses clear.  Negative orbit Other: CT CERVICAL SPINE FINDINGS Alignment: Slight anterolisthesis C4-5.  Mild cervical  kyphosis. Skull base and vertebrae: Negative for fracture. Soft tissues and spinal canal: Hypoplastic left thyroid. 2 cm nodule right thyroid. No enlarged lymph nodes in the neck Disc levels: Disc degeneration and spurring C4 through C7. This is most severe at C5-6 with diffuse uncinate spurring as well as a calcified extruded disc fragment extending caudally. There is moderate spinal stenosis. Severe right foraminal encroachment and moderate left foraminal encroachment at C5-6. Upper chest: Lung apices clear bilaterally Other: None IMPRESSION: 1. No acute intracranial abnormality 2. Negative for cervical spine fracture 3. Cervical spine degenerative change with moderate spinal stenosis at C5-6. Electronically Signed   By: Marlan Palau M.D.   On: 06/13/2020 16:01   CT Cervical Spine Wo Contrast  Result Date: 06/13/2020 CLINICAL DATA:  Head trauma EXAM: CT HEAD WITHOUT CONTRAST CT CERVICAL SPINE WITHOUT CONTRAST TECHNIQUE: Multidetector CT imaging of the head and cervical spine was performed following the standard protocol without intravenous contrast. Multiplanar CT image reconstructions of the cervical spine were also generated. COMPARISON:  None. FINDINGS: CT HEAD FINDINGS Brain: Ventricle size normal. Mild atrophy. Chronic infarct left parietal lobe with encephalomalacia and mild calcification. Negative for acute infarct, hemorrhage, mass. Vascular: Negative for hyperdense vessel Skull: Negative Sinuses/Orbits: Paranasal sinuses clear.  Negative orbit Other: CT CERVICAL SPINE FINDINGS Alignment: Slight anterolisthesis C4-5.  Mild cervical kyphosis. Skull base and vertebrae: Negative for fracture. Soft tissues and spinal  canal: Hypoplastic left thyroid. 2 cm nodule right thyroid. No enlarged lymph nodes in the neck Disc levels: Disc degeneration and spurring C4 through C7. This is most severe at C5-6 with diffuse uncinate spurring as well as a calcified extruded disc fragment extending caudally. There is moderate spinal stenosis. Severe right foraminal encroachment and moderate left foraminal encroachment at C5-6. Upper chest: Lung apices clear bilaterally Other: None IMPRESSION: 1. No acute intracranial abnormality 2. Negative for cervical spine fracture 3. Cervical spine degenerative change with moderate spinal stenosis at C5-6. Electronically Signed   By: Marlan Palau M.D.   On: 06/13/2020 16:01   CT HIP LEFT WO CONTRAST  Result Date: 06/14/2020 CLINICAL DATA:  Chronic hip pain on. EXAM: CT OF THE LEFT HIP WITHOUT CONTRAST TECHNIQUE: Multidetector CT imaging of the left hip was performed according to the standard protocol. Multiplanar CT image reconstructions were also generated. COMPARISON:  None. 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 There are degenerative changes of the left hip without evidence for an acute displaced fracture or dislocation. There are advanced degenerative changes of the lower lumbar spine. Ligaments Suboptimally assessed by CT. Muscles and Tendons No hematoma. Soft tissues The visualized urinary bladder is unremarkable. There is scattered sigmoid diverticula without CT evidence for diverticulitis. There are atherosclerotic changes of the visualized abdominal aorta. IMPRESSION: 1. 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 2. Degenerative changes of the left hip without evidence for an acute displaced fracture or dislocation. 3. 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 Aortic Atherosclerosis (ICD10-I70.0). Electronically Signed   By: Katherine Mantle M.D.   On: 06/14/2020  20:55   DG Knee Complete 4 Views Left  Result Date: 06/13/2020 CLINICAL DATA:  Left knee pain after fall several days ago. EXAM: LEFT KNEE - COMPLETE 4+ VIEW COMPARISON:  None. FINDINGS: No evidence of fracture, dislocation, or joint effusion. Mild narrowing of medial and lateral joint spaces are noted with osteophyte formation. Severe narrowing of patellofemoral space is noted. Soft  tissues are unremarkable. IMPRESSION: Tricompartmental degenerative joint disease. No acute abnormality seen in the left knee. Electronically Signed   By: Lupita Raider M.D.   On: 06/13/2020 16:43   ECHOCARDIOGRAM COMPLETE  Result Date: 06/15/2020    ECHOCARDIOGRAM REPORT   Patient Name:   MIRIAN CASCO Date of Exam: 06/15/2020 Medical Rec #:  [id number]       Height: Accession #:    [id number]      Weight:       240.5 lb Date of Birth:  17-Nov-1950       BSA:          2.284 m Patient Age:    69 years        BP:           146/61 mmHg Patient Gender: F               HR:           57 bpm. Exam Location:  Inpatient Procedure: 2D Echo, Color Doppler and Cardiac Doppler Indications:    I48.92* Unspecified atrial flutter  History:        Patient has no prior history of Echocardiogram examinations.  Sonographer:    Irving Burton Senior RDCS Referring Phys: [phone] SEYED A SHAHMEHDI IMPRESSIONS  1. Left ventricular ejection fraction, by estimation, is 55 to 60%. The left ventricle has normal function. The left ventricle has no regional wall motion abnormalities. There is mild concentric left ventricular hypertrophy. Left ventricular diastolic parameters are consistent with Grade I diastolic dysfunction (impaired relaxation).  2. Right ventricular systolic function is low normal. The right ventricular size is normal. There is normal pulmonary artery systolic pressure.  3. The mitral valve is grossly normal. No evidence of mitral valve regurgitation.  4. The aortic valve is tricuspid. There is mild thickening of the aortic valve. Aortic valve  regurgitation is not visualized. No aortic stenosis is present. Comparison(s): No prior Echocardiogram. FINDINGS  Left Ventricle: Left ventricular ejection fraction, by estimation, is 55 to 60%. The left ventricle has normal function. The left ventricle has no regional wall motion abnormalities. The left ventricular internal cavity size was normal in size. There is  mild concentric left ventricular hypertrophy. Left ventricular diastolic parameters are consistent with Grade I diastolic dysfunction (impaired relaxation). Right Ventricle: The right ventricular size is normal. Right vetricular wall thickness was not well visualized. Right ventricular systolic function is low normal. There is normal pulmonary artery systolic pressure. The tricuspid regurgitant velocity is 2.81 m/s, and with an assumed right atrial pressure of 3 mmHg, the estimated right ventricular systolic pressure is 34.6 mmHg. Left Atrium: Left atrial size was normal in size. Right Atrium: Right atrial size was normal in size. Pericardium: There is no evidence of pericardial effusion. Mitral Valve: The mitral valve is grossly normal. Mild mitral annular calcification. No evidence of mitral valve regurgitation. Tricuspid Valve: The tricuspid valve is grossly normal. Tricuspid valve regurgitation is not demonstrated. Aortic Valve: The aortic valve is tricuspid. There is mild thickening of the aortic valve. Aortic valve regurgitation is not visualized. No aortic stenosis is present. Pulmonic Valve: The pulmonic valve was grossly normal. Pulmonic valve regurgitation is trivial. No evidence of pulmonic stenosis. Aorta: The aortic root and ascending aorta are structurally normal, with no evidence of dilitation. IAS/Shunts: The atrial septum is grossly normal.  LEFT VENTRICLE PLAX 2D LVIDd:         4.70 cm  Diastology LVIDs:  2.80 cm  LV e' medial:    5.22 cm/s LV PW:         1.40 cm  LV E/e' medial:  16.2 LV IVS:        1.10 cm  LV e' lateral:    7.29 cm/s LVOT diam:     2.10 cm  LV E/e' lateral: 11.6 LV SV:         78 LV SV Index:   34 LVOT Area:     3.46 cm  RIGHT VENTRICLE RV S prime:     9.79 cm/s TAPSE (M-mode): 2.0 cm LEFT ATRIUM           Index       RIGHT ATRIUM           Index LA diam:      4.20 cm 1.84 cm/m  RA Area:     16.50 cm LA Vol (A2C): 55.1 ml 24.13 ml/m RA Volume:   41.10 ml  18.00 ml/m LA Vol (A4C): 67.7 ml 29.65 ml/m  AORTIC VALVE LVOT Vmax:   104.00 cm/s LVOT Vmean:  75.600 cm/s LVOT VTI:    0.226 m  AORTA Ao Root diam: 3.40 cm Ao Asc diam:  3.60 cm MITRAL VALVE               TRICUSPID VALVE MV Area (PHT): 2.83 cm    TR Peak grad:   31.6 mmHg MV Decel Time: 268 msec    TR Vmax:        281.00 cm/s MV E velocity: 84.80 cm/s MV A velocity: 90.40 cm/s  SHUNTS MV E/A ratio:  0.94        Systemic VTI:  0.23 m                            Systemic Diam: 2.10 cm Riley Lam MD Electronically signed by Riley Lam MD Signature Date/Time: 06/15/2020/1:16:06 PM    Final    DG Hip Unilat W or Wo Pelvis 2-3 Views Left  Result Date: 06/13/2020 CLINICAL DATA:  Acute left hip pain after fall several days ago. EXAM: DG HIP (WITH OR WITHOUT PELVIS) 2-3V LEFT COMPARISON:  None. FINDINGS: There is no evidence of hip fracture or dislocation. There is no evidence of arthropathy or other focal bone abnormality. IMPRESSION: Negative. Electronically Signed   By: Lupita Raider M.D.   On: 06/13/2020 16:44   VAS [place] LOWER EXTREMITY VENOUS (DVT)  Result Date: 06/14/2020  Lower Venous DVT Study Indications: Swelling.  Limitations: Patient's inability to move left leg. Comparison Study: No prior study Performing Technologist: Sherren Kerns RVS  Examination Guidelines: A complete evaluation includes B-mode imaging, spectral Doppler, color Doppler, and power Doppler as needed of all accessible portions of each vessel. Bilateral testing is considered an integral part of a complete examination. Limited examinations for reoccurring  indications may be performed as noted. The reflux portion of the exam is performed with the patient in reverse Trendelenburg.  Right Technical Findings: Right leg not evaluated.  +---------+---------------+---------+-----------+----------+-------------------+ LEFT     CompressibilityPhasicitySpontaneityPropertiesThrombus Aging      +---------+---------------+---------+-----------+----------+-------------------+ CFV      Full           Yes      Yes                                      +---------+---------------+---------+-----------+----------+-------------------+  SFJ      Full                                                             +---------+---------------+---------+-----------+----------+-------------------+ FV Prox  Full                                                             +---------+---------------+---------+-----------+----------+-------------------+ FV Mid   Full                                                             +---------+---------------+---------+-----------+----------+-------------------+ FV DistalFull                                                             +---------+---------------+---------+-----------+----------+-------------------+ PFV      Full                                                             +---------+---------------+---------+-----------+----------+-------------------+ POP                     Yes      Yes                  patent by color and                                                       Doppler             +---------+---------------+---------+-----------+----------+-------------------+ PTV      Full                                                             +---------+---------------+---------+-----------+----------+-------------------+ PERO     Full                                                              +---------+---------------+---------+-----------+----------+-------------------+     Summary: LEFT: - There is no evidence of deep vein thrombosis in the  lower extremity. However, portions of this examination were limited- see technologist comments above.  *See table(s) above for measurements and observations. Electronically signed by Coral Else MD on 06/14/2020 at 7:58:01 PM.    Final     SIGNED: Hughie Closs, MD [facility],  Pager (please use [email or web address] to page/text) Please use Epic Secure Chat for non-urgent communication (7AM-7PM)  If 7PM-7AM, please contact night-coverage [email or web address], 06/16/2020, 1:40 PM

## 2020-06-16 NOTE — Progress Notes (Addendum)
Progress Note  Patient Name: Kristy Jensen Date of Encounter: 06/16/2020  [facility] Cardiologist: Meriam Sprague, MD   Subjective   No chest pain or SOB   Inpatient Medications    Scheduled Meds: . amLODipine  5 mg Oral Daily  . aspirin EC  81 mg Oral Daily  . insulin aspart  0-9 Units Subcutaneous TID WC  . rosuvastatin  20 mg Oral Daily   Continuous Infusions: . sodium chloride 100 mL/hr at 06/16/20 0155   PRN Meds: acetaminophen **OR** acetaminophen, hydrALAZINE, ondansetron **OR** ondansetron (ZOFRAN) IV   Vital Signs    Vitals:   06/15/20 2010 06/15/20 2334 06/16/20 0426 06/16/20 0750  BP: (!) 150/58 (!) 159/68 (!) 181/76 (!) 166/78  Pulse: 86 96 90 80  Resp: 19 19 19 18   Temp: 99 F (37.2 C) 99.3 F (37.4 C) 97.7 F (36.5 C) 97.7 F (36.5 C)  TempSrc: Oral Oral Oral Oral  SpO2: 96% 97% 97% 97%  Weight:   116.1 kg     Intake/Output Summary (Last 24 hours) at 06/16/2020 0959 Last data filed at 06/15/2020 1816 Gross per 24 hour  Intake --  Output 475 ml  Net -475 ml   Last 3 Weights 06/16/2020 06/15/2020  Weight (lbs) 255 lb 15.3 oz 240 lb 8.4 oz  Weight (kg) 116.1 kg 109.1 kg      Telemetry    SR does have 3 beats NSVT at 930 today - Personally Reviewed  ECG    No new - Personally Reviewed  Physical Exam   GEN: No acute distress.   Neck: No JVD Cardiac: RRR, no murmurs, rubs, or gallops.  Respiratory: Clear to auscultation bilaterally. GI: Soft, nontender, non-distended  MS: No edema; No deformity. Neuro:  Nonfocal  Psych: Normal affect   Labs    High Sensitivity Troponin:   Recent Labs  Lab 06/13/20 1901 06/13/20 1910 06/14/20 1940  TROPONINIHS 21* 21* 24*      Chemistry Recent Labs  Lab 06/13/20 1431 06/13/20 1506 06/14/20 0337 06/15/20 0604 06/16/20 0116  NA 136   < > 136 137 137  K 4.2   < > 3.7 4.0 3.9  CL 105   < > 105 107 107  CO2 20*  --  19* 20* 20*  GLUCOSE 158*   < > 134* 116* 108*  BUN 21   < >  19 14 16   CREATININE 1.09*   < > 1.10* 0.92 0.94  CALCIUM 8.8*  --  9.1 8.7* 8.6*  PROT 6.7  --  6.4*  --   --   ALBUMIN 3.4*  --  3.1*  --   --   AST 24  --  15  --   --   ALT 15  --  15  --   --   ALKPHOS 49  --  46  --   --   BILITOT 1.2  --  1.0  --   --   GFRNONAA 55*  --  54* >60 >60  ANIONGAP 11  --  12 10 10    < > = values in this interval not displayed.     Hematology Recent Labs  Lab 06/14/20 0337 06/15/20 0604 06/16/20 0116  WBC 10.3 10.1 9.0  RBC 4.16 4.20 3.83*  HGB 11.8* 11.3* 10.8*  HCT 35.8* 36.7 32.0*  MCV 86.1 87.4 83.6  MCH 28.4 26.9 28.2  MCHC 33.0 30.8 33.8  RDW 14.3 14.0 13.9  PLT 266 250 251  BNPNo results for input(s): BNP, PROBNP in the last 168 hours.   DDimer No results for input(s): DDIMER in the last 168 hours.   Radiology    CT HIP LEFT WO CONTRAST  Result Date: 06/14/2020 CLINICAL DATA:  Chronic hip pain on. EXAM: CT OF THE LEFT HIP WITHOUT CONTRAST TECHNIQUE: Multidetector CT imaging of the left hip was performed according to the standard protocol. Multiplanar CT image reconstructions were also generated. COMPARISON:  None. 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 There are degenerative changes of the left hip without evidence for an acute displaced fracture or dislocation. There are advanced degenerative changes of the lower lumbar spine. Ligaments Suboptimally assessed by CT. Muscles and Tendons No hematoma. Soft tissues The visualized urinary bladder is unremarkable. There is scattered sigmoid diverticula without CT evidence for diverticulitis. There are atherosclerotic changes of the visualized abdominal aorta. IMPRESSION: 1. 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 2. Degenerative changes of the left hip without evidence for an acute displaced fracture or dislocation. 3. 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  Aortic Atherosclerosis (ICD10-I70.0). Electronically Signed   By: Katherine Mantlehristopher  Green M.D.   On: 06/14/2020 20:55   ECHOCARDIOGRAM COMPLETE  Result Date: 06/15/2020    ECHOCARDIOGRAM REPORT   Patient Name:   Kristy GandyVIRGINIA Mcnealy Date of Exam: 06/15/2020 Medical Rec #:  [id number]       Height: Accession #:    [id number]      Weight:       240.5 lb Date of Birth:  05-02-1951       BSA:          2.284 m Patient Age:    70 years        BP:           146/61 mmHg Patient Gender: F               HR:           57 bpm. Exam Location:  Inpatient Procedure: 2D Echo, Color Doppler and Cardiac Doppler Indications:    I48.92* Unspecified atrial flutter  History:        Patient has no prior history of Echocardiogram examinations.  Sonographer:    Irving BurtonEmily Senior RDCS Referring Phys: [phone] SEYED A SHAHMEHDI IMPRESSIONS  1. Left ventricular ejection fraction, by estimation, is 55 to 60%. The left ventricle has normal function. The left ventricle has no regional wall motion abnormalities. There is mild concentric left ventricular hypertrophy. Left ventricular diastolic parameters are consistent with Grade I diastolic dysfunction (impaired relaxation).  2. Right ventricular systolic function is low normal. The right ventricular size is normal. There is normal pulmonary artery systolic pressure.  3. The mitral valve is grossly normal. No evidence of mitral valve regurgitation.  4. The aortic valve is tricuspid. There is mild thickening of the aortic valve. Aortic valve regurgitation is not visualized. No aortic stenosis is present. Comparison(s): No prior Echocardiogram. FINDINGS  Left Ventricle: Left ventricular ejection fraction, by estimation, is 55 to 60%. The left ventricle has normal function. The left ventricle has no regional wall motion abnormalities. The left ventricular internal cavity size was normal in size. There is  mild concentric left ventricular hypertrophy. Left ventricular diastolic parameters are consistent with Grade I  diastolic dysfunction (impaired relaxation). Right Ventricle: The right ventricular size is normal. Right vetricular wall thickness was not well visualized. Right ventricular systolic function  is low normal. There is normal pulmonary artery systolic pressure. The tricuspid regurgitant velocity is 2.81 m/s, and with an assumed right atrial pressure of 3 mmHg, the estimated right ventricular systolic pressure is 34.6 mmHg. Left Atrium: Left atrial size was normal in size. Right Atrium: Right atrial size was normal in size. Pericardium: There is no evidence of pericardial effusion. Mitral Valve: The mitral valve is grossly normal. Mild mitral annular calcification. No evidence of mitral valve regurgitation. Tricuspid Valve: The tricuspid valve is grossly normal. Tricuspid valve regurgitation is not demonstrated. Aortic Valve: The aortic valve is tricuspid. There is mild thickening of the aortic valve. Aortic valve regurgitation is not visualized. No aortic stenosis is present. Pulmonic Valve: The pulmonic valve was grossly normal. Pulmonic valve regurgitation is trivial. No evidence of pulmonic stenosis. Aorta: The aortic root and ascending aorta are structurally normal, with no evidence of dilitation. IAS/Shunts: The atrial septum is grossly normal.  LEFT VENTRICLE PLAX 2D LVIDd:         4.70 cm  Diastology LVIDs:         2.80 cm  LV e' medial:    5.22 cm/s LV PW:         1.40 cm  LV E/e' medial:  16.2 LV IVS:        1.10 cm  LV e' lateral:   7.29 cm/s LVOT diam:     2.10 cm  LV E/e' lateral: 11.6 LV SV:         78 LV SV Index:   34 LVOT Area:     3.46 cm  RIGHT VENTRICLE RV S prime:     9.79 cm/s TAPSE (M-mode): 2.0 cm LEFT ATRIUM           Index       RIGHT ATRIUM           Index LA diam:      4.20 cm 1.84 cm/m  RA Area:     16.50 cm LA Vol (A2C): 55.1 ml 24.13 ml/m RA Volume:   41.10 ml  18.00 ml/m LA Vol (A4C): 67.7 ml 29.65 ml/m  AORTIC VALVE LVOT Vmax:   104.00 cm/s LVOT Vmean:  75.600 cm/s LVOT VTI:     0.226 m  AORTA Ao Root diam: 3.40 cm Ao Asc diam:  3.60 cm MITRAL VALVE               TRICUSPID VALVE MV Area (PHT): 2.83 cm    TR Peak grad:   31.6 mmHg MV Decel Time: 268 msec    TR Vmax:        281.00 cm/s MV E velocity: 84.80 cm/s MV A velocity: 90.40 cm/s  SHUNTS MV E/A ratio:  0.94        Systemic VTI:  0.23 m                            Systemic Diam: 2.10 cm Riley Lam MD Electronically signed by Riley Lam MD Signature Date/Time: 06/15/2020/1:16:06 PM    Final    VAS [place] LOWER EXTREMITY VENOUS (DVT)  Result Date: 06/14/2020  Lower Venous DVT Study Indications: Swelling.  Limitations: Patient's inability to move left leg. Comparison Study: No prior study Performing Technologist: Sherren Kerns RVS  Examination Guidelines: A complete evaluation includes B-mode imaging, spectral Doppler, color Doppler, and power Doppler as needed of all accessible portions of each vessel. Bilateral testing is considered an integral part of a  complete examination. Limited examinations for reoccurring indications may be performed as noted. The reflux portion of the exam is performed with the patient in reverse Trendelenburg.  Right Technical Findings: Right leg not evaluated.  +---------+---------------+---------+-----------+----------+-------------------+ LEFT     CompressibilityPhasicitySpontaneityPropertiesThrombus Aging      +---------+---------------+---------+-----------+----------+-------------------+ CFV      Full           Yes      Yes                                      +---------+---------------+---------+-----------+----------+-------------------+ SFJ      Full                                                             +---------+---------------+---------+-----------+----------+-------------------+ FV Prox  Full                                                             +---------+---------------+---------+-----------+----------+-------------------+ FV Mid   Full                                                              +---------+---------------+---------+-----------+----------+-------------------+ FV DistalFull                                                             +---------+---------------+---------+-----------+----------+-------------------+ PFV      Full                                                             +---------+---------------+---------+-----------+----------+-------------------+ POP                     Yes      Yes                  patent by color and                                                       Doppler             +---------+---------------+---------+-----------+----------+-------------------+ PTV      Full                                                             +---------+---------------+---------+-----------+----------+-------------------+  PERO     Full                                                             +---------+---------------+---------+-----------+----------+-------------------+     Summary: LEFT: - There is no evidence of deep vein thrombosis in the lower extremity. However, portions of this examination were limited- see technologist comments above.  *See table(s) above for measurements and observations. Electronically signed by Coral Else MD on 06/14/2020 at 7:58:01 PM.    Final     Cardiac Studies   TTE 06/15/20 IMPRESSIONS    1. Left ventricular ejection fraction, by estimation, is 55 to 60%. The  left ventricle has normal function. The left ventricle has no regional  wall motion abnormalities. There is mild concentric left ventricular  hypertrophy. Left ventricular diastolic  parameters are consistent with Grade I diastolic dysfunction (impaired  relaxation).  2. Right ventricular systolic function is low normal. The right  ventricular size is normal. There is normal pulmonary artery systolic  pressure.  3. The mitral valve is grossly normal. No  evidence of mitral valve  regurgitation.  4. The aortic valve is tricuspid. There is mild thickening of the aortic  valve. Aortic valve regurgitation is not visualized. No aortic stenosis is  present.   Comparison(s): No prior Echocardiogram.   FINDINGS  Left Ventricle: Left ventricular ejection fraction, by estimation, is 55  to 60%. The left ventricle has normal function. The left ventricle has no  regional wall motion abnormalities. The left ventricular internal cavity  size was normal in size. There is  mild concentric left ventricular hypertrophy. Left ventricular diastolic  parameters are consistent with Grade I diastolic dysfunction (impaired  relaxation).   Right Ventricle: The right ventricular size is normal. Right vetricular  wall thickness was not well visualized. Right ventricular systolic  function is low normal. There is normal pulmonary artery systolic  pressure. The tricuspid regurgitant velocity is  2.81 m/s, and with an assumed right atrial pressure of 3 mmHg, the  estimated right ventricular systolic pressure is 34.6 mmHg.   Left Atrium: Left atrial size was normal in size.   Right Atrium: Right atrial size was normal in size.   Pericardium: There is no evidence of pericardial effusion.   Mitral Valve: The mitral valve is grossly normal. Mild mitral annular  calcification. No evidence of mitral valve regurgitation.   Tricuspid Valve: The tricuspid valve is grossly normal. Tricuspid valve  regurgitation is not demonstrated.   Aortic Valve: The aortic valve is tricuspid. There is mild thickening of  the aortic valve. Aortic valve regurgitation is not visualized. No aortic  stenosis is present.   Pulmonic Valve: The pulmonic valve was grossly normal. Pulmonic valve  regurgitation is trivial. No evidence of pulmonic stenosis.   Aorta: The aortic root and ascending aorta are structurally normal, with  no evidence of dilitation.   Patient Profile      70 y.o. female with a hx of CVA in 2019 and hyperlipidemia now seen for NSVT and atrial flutter, admitted for AMS.   Assessment & Plan    1. Possible NSVT /atrial flutter in ER but no strips to evaluate.   Reported as 12 beats of NSVT then short run of atrial flutter.  Continue tele.  May need 30  day event monitor at discharge.  --follow up TTE with EF 55-60% normal RV and both atrium normal in size.   --continue ASA daily --tropoin flat 21,24 --strip 06/14/20 at 1538 is SR with artifact not VT or flutter  --no need for anticoagulation   2. AMS due to UTI per IM  3.  Hx of CVA in 2019 on ASA  4.  HLD we added statin with hx of CVA        For questions or updates, please contact [facility] Please consult [email or web address] for contact info under        Signed, Nada Boozer, NP  06/16/2020, 9:59 AM    Patient seen and examined with Nada Boozer NP.  Agree as above, with the following exceptions and changes as noted below. Feels well without SOB or CP. Gen: NAD, CV: RRR, no murmurs, Lungs: clear, Abd: soft, Extrem: Warm, well perfused, no edema, Neuro/Psych: alert and oriented x 3, normal mood and affect. All available labs, radiology testing, previous records reviewed.  3 beats of NSVT on telemetry. Would recommend 30 day event monitor on discharge. Agree with ASA and statin. Echo structurally normal.   [facility] will sign off.   Medication Recommendations:  Continue per [facility] Other recommendations (labs, testing, etc):  30 day event monitor - we will arrange Follow up as an outpatient:  Dr. Shari Prows - CV in 6 weeks.    Parke Poisson, MD 06/16/20 11:05 AM

## 2020-06-17 ENCOUNTER — Inpatient Hospital Stay (HOSPITAL_COMMUNITY): Payer: Medicare HMO

## 2020-06-17 LAB — BASIC METABOLIC PANEL
Anion gap: 7 (ref 5–15)
BUN: 15 mg/dL (ref 8–23)
CO2: 23 mmol/L (ref 22–32)
Calcium: 8.8 mg/dL — ABNORMAL LOW (ref 8.9–10.3)
Chloride: 106 mmol/L (ref 98–111)
Creatinine, Ser: 0.86 mg/dL (ref 0.44–1.00)
GFR, Estimated: >60 mL/min (ref >60)
Glucose, Bld: 114 mg/dL — ABNORMAL HIGH (ref 70–99)
Potassium: 3.9 mmol/L (ref 3.5–5.1)
Sodium: 136 mmol/L (ref 135–145)

## 2020-06-17 LAB — LIPID PANEL
Cholesterol: 162 mg/dL (ref 0–200)
HDL: 33 mg/dL — ABNORMAL LOW (ref >40)
LDL Cholesterol: 105 mg/dL — ABNORMAL HIGH (ref 0–99)
Total CHOL/HDL Ratio: 4.9 RATIO
Triglycerides: 118 mg/dL (ref <150)
VLDL: 24 mg/dL (ref 0–40)

## 2020-06-17 LAB — GLUCOSE, CAPILLARY
Glucose-Capillary: 93 mg/dL (ref 70–99)
Glucose-Capillary: 138 mg/dL — ABNORMAL HIGH (ref 70–99)
Glucose-Capillary: 89 mg/dL (ref 70–99)
Glucose-Capillary: 138 mg/dL — ABNORMAL HIGH (ref 70–99)
Glucose-Capillary: 135 mg/dL — ABNORMAL HIGH (ref 70–99)

## 2020-06-17 LAB — CBC
HCT: 36.2 % (ref 36.0–46.0)
Hemoglobin: 11.5 g/dL — ABNORMAL LOW (ref 12.0–15.0)
MCH: 26.7 pg (ref 26.0–34.0)
MCHC: 31.8 g/dL (ref 30.0–36.0)
MCV: 84.2 fL (ref 80.0–100.0)
Platelets: 288 10*3/uL (ref 150–400)
RBC: 4.3 MIL/uL (ref 3.87–5.11)
RDW: 13.9 % (ref 11.5–15.5)
WBC: 8.4 10*3/uL (ref 4.0–10.5)
nRBC: 0 % (ref 0.0–0.2)

## 2020-06-17 MED ORDER — INSULIN ASPART 100 UNIT/ML ~~LOC~~ SOLN
0.0000 [IU] | Freq: Three times a day (TID) | SUBCUTANEOUS | Status: DC
  Administered 2020-06-17 – 2020-06-18 (×2): 1 [IU] via SUBCUTANEOUS
  Administered 2020-06-18: 2 [IU] via SUBCUTANEOUS
  Administered 2020-06-19: 1 [IU] via SUBCUTANEOUS

## 2020-06-17 MED ORDER — THIAMINE HCL 100 MG PO TABS
100.0000 mg | ORAL_TABLET | Freq: Every day | ORAL | Status: DC
  Administered 2020-06-18 – 2020-06-19 (×2): 100 mg via ORAL
  Filled 2020-06-17 (×2): qty 1

## 2020-06-17 MED ORDER — IOHEXOL 350 MG/ML SOLN
50.0000 mL | Freq: Once | INTRAVENOUS | Status: AC | PRN
  Administered 2020-06-17: 50 mL via INTRAVENOUS

## 2020-06-17 MED ORDER — ATORVASTATIN CALCIUM 40 MG PO TABS: 40.0000 mg | ORAL_TABLET | Freq: Every day | ORAL | Status: DC

## 2020-06-17 MED ORDER — CLOPIDOGREL BISULFATE 75 MG PO TABS
75.0000 mg | ORAL_TABLET | Freq: Every day | ORAL | Status: DC
  Administered 2020-06-17 – 2020-06-19 (×3): 75 mg via ORAL
  Filled 2020-06-17 (×3): qty 1

## 2020-06-17 MED ORDER — FLUOXETINE HCL 10 MG PO CAPS
10.0000 mg | ORAL_CAPSULE | Freq: Every day | ORAL | Status: DC
  Administered 2020-06-18 – 2020-06-19 (×2): 10 mg via ORAL
  Filled 2020-06-17 (×2): qty 1

## 2020-06-17 NOTE — TOC Progression Note (Signed)
Transition of Care [facility]) - Progression Note    Patient Details  Name: Kristy Jensen MRN: [id number] Date of Birth: 1950-12-27  Transition of Care [facility]) CM/SW Contact  Eduard Roux, [place] Phone Number: 06/17/2020, 4:55 PM  Clinical Narrative:     Received call form patient's son [name]- he selected [address] CSW sent message to [facility] [place]/SNF- to confirm bed offer- waiting on response Patient will need insurance authorization (SNF will get the auth) before discharge to SNF.   Antony Blackbird, MSW, LCSW Clinical Social Worker   Expected Discharge Plan: Skilled Nursing Facility Barriers to Discharge: Insurance Authorization,Continued Medical Work up,SNF Pending bed offer  Expected Discharge Plan and Services Expected Discharge Plan: Skilled Nursing Facility In-house Referral: Clinical Social Work     Living arrangements for the past 2 months: Independent Living Facility                                       Social Determinants of Health (SDOH) Interventions    Readmission Risk Interventions No flowsheet data found.

## 2020-06-17 NOTE — Progress Notes (Signed)
PROGRESS NOTE    Patient: Kristy Jensen                            PCP: Patient, No Pcp Per                    DOB: [birth year]            DOA: 06/13/2020 [id number]             DOS: 06/17/2020, 2:43 PM   LOS: 4 days   Date of Service: The patient was seen and examined on 06/17/2020  PCP in [place] Dr. Marnee Guarneri [phone] (provided by her son)  Subjective:   Patient seen and examined this morning.  She is alert and oriented x1.  No complaints.  Brief Narrative:   Kristy Jensen is a 70 y.o.f with PMH/o   CVA but otherwise no significant other known history who lives independently per family brought in secondary to altered mental status.   Patient's son is primary historian.  She apparently moved from [place] recently.  He moved her here because of increasing confusion.  He has no clear history of patient's medical problems and is not aware of the medications she takes.  Patient is able to say that she had a stroke in 2019.  She fell a few days ago and yesterday but reportedly fell again this time out of bed.  She has also been swelling herself.  Patient has no local primary caregiver.  She is therefore not able to give [place] adequate history regarding her current medical situation.    Patient being admitted to the hospital for further evaluation and treatment.  Father son she has also decreased oral intake.  Patient evaluated in the ER and was found to have UTI.   Suspicion for toxic metabolic encephalopathy therefore is made.    No prior history of dementia.  No head injuries..  ED Course: vitals and LABs were with in normal limits.  Head CT without contrast is negative.  COVID-19 screen is negative.  Troponin is 21 and flat.  CT C-spine is also negative.  Chest x-ray and hip x-rays are at this point negative.   Patient is therefore being admitted based on urinalysis which shows WBC more than 50 many bacteria RBC 11-20 and large leukocytes.  It is also cloudy urine.   Appeared to  have UTI.   Assessment & Plan:   Principal Problem:   Metabolic encephalopathy Active Problems:   CVA (cerebral vascular accident) (HCC)   Acute lower UTI   Hyperglycemia   Altered mental status   Toxic metabolic encephalopathy/UTI/acute ischemic stroke with a history of CVA: -Remained hemodynamically stable  -Alert oriented x2, likely baseline Her confusion apparently has improved from what she presented with.  UA slightly consistent with UTI for which she completed treatment with fosfomycin with the last dose being on 06/16/2020.  However with new finding of MRI brain which now shows multiple small infarcts involving right basal ganglia, her acute stroke could be the culprit for encephalopathy.  Fortunately, she does not have any focal deficit other than confusion.  There is some concern about possible vascular stenosis so CT angiogram of head and neck has been ordered.  She is already on aspirin and I have added Plavix.  I am switching her to atorvastatin as well.  I have consulted neurology to see this patient formally as well.  Acute cystitis -Apparently  patient has a penicillin allergy -not clear  -Patient completed course of fosfomycin. -Urine/blood cultures >> no growth to date   ?Thrombocytopenia -On admission 49 this morning 266 -Resolved  Hypoalbuminemia, hypocalcemia -We will monitorand  replete accordingly  Hyperglycemia:  CBG: 164, 138, 134 Noted hyperglycemia with no prior history of diabetes as far as we know.   Hemoglobin A1c: 6.0  Hypertension -We see no history of hypertension, not any medication Was added 5 mg of amlodipine here.  Blood pressure fairly stable.  Continue this and as needed hydralazine.  Elevated troponin /run of V. Tach,???  Brief flutter -Troponins remain flat at 21, 21, 24 -Denies any chest pain, no EKG changes Per cardiology, there are no strips available which show any evidence of atrial flutter. -Currently normal sinus  rhythm -Cardiology consulted --appreciate evaluation.  No indication of rate control medications. -Continue to monitor on monitored bed  Left leg pain/hip pain -X-ray negative, CT -reviewed negative for any acute changes fractures Old inferior rami fracture -Ultrasound left lower extremity negative for DVT in the left leg. - As needed analgesics PT on board.  Recommendations for SNF.  Patient did not complain of any leg pain today.  There was no tenderness or swelling on my examination today either.   ------------------------------------------------------------------------------------------------------------------------------------ Nutritional status:  The patient's BMI is: There is no height or weight on file to calculate BMI. I agree with the assessment and plan as outlined .Marland Kitchen.  ----------------------------------------------------------------------------------------------------------------------------------- Cultures; Blood Cultures x 2 >> NGT Urine Culture  >>> NGT    Antimicrobials: Fosfomycin first dose 06/13/2020 Anticipating second dose 06/16/2020   Consultants: Neurology   ------------------------------------------------------------------------------------------------------------------------------------  DVT prophylaxis:  Lovenox SQ Code Status:   Code Status: Full Code  Family Communication: No family member present at bedsid.  I updated her son through over the phone about her stroke and answered several questions.  Admission status:   Status is: Inpatient  Remains inpatient appropriate because:Inpatient level of care appropriate due to severity of illness   Dispo: The patient is from: Home              Anticipated d/c is to: SNF.  [facility]              Anticipated d/c date is: 1 to 2 days              Patient currently is medically stable to d/c.   Difficult to place patient yes   Level of care: Telemetry  Medical   Procedures:   No admission procedures for hospital encounter.     Antimicrobials:  Anti-infectives (From admission, onward)   Start     Dose/Rate Route Frequency Ordered Stop   06/16/20 0000  fosfomycin (MONUROL) packet 3 g  Status:  Discontinued        3 g Oral  Once 06/14/20 1052 06/14/20 1129   06/13/20 1800  fosfomycin (MONUROL) packet 3 g        3 g Oral  Once 06/13/20 1754 06/13/20 2014       Medication:  . amLODipine  5 mg Oral Daily  . aspirin EC  81 mg Oral Daily  . [START ON 06/18/2020] atorvastatin  40 mg Oral q1800  . clopidogrel  75 mg Oral Daily  . insulin aspart  0-9 Units Subcutaneous TID WC    acetaminophen **OR** acetaminophen, hydrALAZINE, ondansetron **OR** ondansetron (ZOFRAN) IV   Objective:   Vitals:   06/16/20 2311 06/17/20 0331 06/17/20 0920  06/17/20 1351  BP: 138/67 134/63 (!) 159/66 (!) 152/70  Pulse:  63 79 76  Resp: (!) 25 (!) 22 20 20   Temp: 100 F (37.8 C) 98.6 F (37 C) 98.6 F (37 C) 98.9 F (37.2 C)  TempSrc: Oral Oral Oral Oral  SpO2: 97% 97% 96% 96%  Weight:  114.2 kg      Intake/Output Summary (Last 24 hours) at 06/17/2020 1443 Last data filed at 06/17/2020 0930 Gross per 24 hour  Intake 1300 ml  Output 3500 ml  Net -2200 ml   Filed Weights   06/15/20 0258 06/16/20 0426 06/17/20 0331  Weight: 109.1 kg 116.1 kg 114.2 kg     Examination:   General exam: Appears calm and comfortable  Respiratory system: Clear to auscultation. Respiratory effort normal. Cardiovascular system: S1 & S2 heard, RRR. No JVD, murmurs, rubs, gallops or clicks. No pedal edema. Gastrointestinal system: Abdomen is nondistended, soft and nontender. No organomegaly or masses felt. Normal bowel sounds heard. Central nervous system: Alert and oriented x2. No focal neurological deficits. Extremities: Symmetric 5 x 5 power. Skin: No rashes, lesions or ulcers.  Psychiatry: Judgement and insight appear  poor  ------------------------------------------------------------------------------------------------------------------------------------------    LABs:  CBC Latest Ref Rng & Units 06/17/2020 06/16/2020 06/15/2020  WBC 4.0 - 10.5 K/uL 8.4 9.0 10.1  Hemoglobin 12.0 - 15.0 g/dL 11.5(L) 10.8(L) 11.3(L)  Hematocrit 36.0 - 46.0 % 36.2 32.0(L) 36.7  Platelets 150 - 400 K/uL 288 251 250   CMP Latest Ref Rng & Units 06/17/2020 06/16/2020 06/15/2020  Glucose 70 - 99 mg/dL 06/17/2020) 253(G) 644(I)  BUN 8 - 23 mg/dL 15 16 14   Creatinine 0.44 - 1.00 mg/dL 347(Q 2.59  Sodium 135 - 145 mmol/L 136 137 137  Potassium 3.5 - 5.1 mmol/L 3.9 3.9 4.0  Chloride 98 - 111 mmol/L 106 107 107  CO2 22 - 32 mmol/L 23 20(L) 20(L)  Calcium 8.9 - 10.3 mg/dL 5.63) 8.75) 6.4(P)  Total Protein 6.5 - 8.1 g/dL - - -  Total Bilirubin 0.3 - 1.2 mg/dL - - -  Alkaline Phos 38 - 126 U/L - - -  AST 15 - 41 U/L - - -  ALT 0 - 44 U/L - - -       Micro Results Recent Results (from the past 240 hour(s))  Culture, blood (routine x 2)     Status: None (Preliminary result)   Collection Time: 06/13/20  6:52 PM   Specimen: BLOOD RIGHT FOREARM  Result Value Ref Range Status   Specimen Description BLOOD RIGHT FOREARM  Final   Special Requests   Final    BOTTLES DRAWN AEROBIC AND ANAEROBIC Blood Culture results may not be optimal due to an inadequate volume of blood received in culture bottles   Culture   Final    NO GROWTH 3 DAYS Performed at [facility], [address] [place], [address] [place]    Report Status PENDING  Incomplete  Culture, blood (routine x 2)     Status: None (Preliminary result)   Collection Time: 06/13/20  7:10 PM   Specimen: BLOOD  Result Value Ref Range Status   Specimen Description BLOOD RIGHT ANTECUBITAL  Final   Special Requests   Final    BOTTLES DRAWN AEROBIC AND ANAEROBIC Blood Culture adequate volume   Culture   Final    NO GROWTH 3 DAYS Performed at [facility], [address], [place], [address] [place]    Report  Status PENDING  Incomplete  Resp Panel by RT-PCR (Flu A&B, Covid) Nasopharyngeal Swab     Status: None   Collection Time: 06/13/20  8:20 PM   Specimen: Nasopharyngeal Swab; Nasopharyngeal(NP) swabs in vial transport medium  Result Value Ref Range Status   SARS Coronavirus 2 by RT PCR NEGATIVE NEGATIVE Final    Comment: (NOTE) SARS-CoV-2 target nucleic acids are NOT DETECTED.  The SARS-CoV-2 RNA is generally detectable in upper respiratory specimens during the acute phase of infection. The lowest concentration of SARS-CoV-2 viral copies this assay can detect is 138 copies/mL. A negative result does not preclude SARS-Cov-2 infection and should not be used as the sole basis for treatment or other patient management decisions. A negative result may occur with  improper specimen collection/handling, submission of specimen other than nasopharyngeal swab, presence of viral mutation(s) within the areas targeted by this assay, and inadequate number of viral copies(<138 copies/mL). A negative result must be combined with clinical observations, patient history, and epidemiological information. The expected result is Negative.  Fact Sheet for Patients:  BloggerCourse.com  Fact Sheet for Healthcare Providers:  SeriousBroker.it  This test is no t yet approved or cleared by the [place] FDA and  has been authorized for detection and/or diagnosis of SARS-CoV-2 by FDA under an Emergency Use Authorization (EUA). This EUA will remain  in effect (meaning this test can be used) for the duration of the COVID-19 declaration under Section 564(b)(1) of the Act, 21 U.S.C.section 360bbb-3(b)(1), unless the authorization is terminated  or revoked sooner.       Influenza A by PCR NEGATIVE NEGATIVE Final   Influenza B by PCR NEGATIVE NEGATIVE Final    Comment: (NOTE) The Xpert Xpress SARS-CoV-2/FLU/RSV plus  assay is intended as an aid in the diagnosis of influenza from Nasopharyngeal swab specimens and should not be used as a sole basis for treatment. Nasal washings and aspirates are unacceptable for Xpert Xpress SARS-CoV-2/FLU/RSV testing.  Fact Sheet for Patients: BloggerCourse.com  Fact Sheet for Healthcare Providers: SeriousBroker.it  This test is not yet approved or cleared by the [place] FDA and has been authorized for detection and/or diagnosis of SARS-CoV-2 by FDA under an Emergency Use Authorization (EUA). This EUA will remain in effect (meaning this test can be used) for the duration of the COVID-19 declaration under Section 564(b)(1) of the Act, 21 U.S.C. section 360bbb-3(b)(1), unless the authorization is terminated or revoked.  Performed at [facility], [address], [place], [place] [address]     Radiology Reports DG Chest 2 View  Result Date: 06/13/2020 CLINICAL DATA:  Status post fall. EXAM: CHEST - 2 VIEW COMPARISON:  None. FINDINGS: The heart size and mediastinal contours are within normal limits. No pneumothorax or pleural effusion is noted. Mild bibasilar subsegmental atelectasis or scarring is noted. The visualized skeletal structures are unremarkable. IMPRESSION: Mild bibasilar subsegmental atelectasis or scarring. Electronically Signed   By: Lupita Raider M.D.   On: 06/13/2020 16:42   CT Head Wo Contrast  Result Date: 06/13/2020 CLINICAL DATA:  Head trauma EXAM: CT HEAD WITHOUT CONTRAST CT CERVICAL SPINE WITHOUT CONTRAST TECHNIQUE: Multidetector CT imaging of the head and cervical spine was performed following the standard protocol without intravenous contrast. Multiplanar CT image reconstructions of the cervical spine were also generated. COMPARISON:  None. FINDINGS: CT HEAD FINDINGS Brain: Ventricle size normal. Mild atrophy. Chronic infarct left parietal lobe with encephalomalacia and mild  calcification. Negative for acute infarct, hemorrhage, mass. Vascular: Negative for hyperdense  vessel Skull: Negative Sinuses/Orbits: Paranasal sinuses clear.  Negative orbit Other: CT CERVICAL SPINE FINDINGS Alignment: Slight anterolisthesis C4-5.  Mild cervical kyphosis. Skull base and vertebrae: Negative for fracture. Soft tissues and spinal canal: Hypoplastic left thyroid. 2 cm nodule right thyroid. No enlarged lymph nodes in the neck Disc levels: Disc degeneration and spurring C4 through C7. This is most severe at C5-6 with diffuse uncinate spurring as well as a calcified extruded disc fragment extending caudally. There is moderate spinal stenosis. Severe right foraminal encroachment and moderate left foraminal encroachment at C5-6. Upper chest: Lung apices clear bilaterally Other: None IMPRESSION: 1. No acute intracranial abnormality 2. Negative for cervical spine fracture 3. Cervical spine degenerative change with moderate spinal stenosis at C5-6. Electronically Signed   By: Marlan Palau M.D.   On: 06/13/2020 16:01   CT Cervical Spine Wo Contrast  Result Date: 06/13/2020 CLINICAL DATA:  Head trauma EXAM: CT HEAD WITHOUT CONTRAST CT CERVICAL SPINE WITHOUT CONTRAST TECHNIQUE: Multidetector CT imaging of the head and cervical spine was performed following the standard protocol without intravenous contrast. Multiplanar CT image reconstructions of the cervical spine were also generated. COMPARISON:  None. FINDINGS: CT HEAD FINDINGS Brain: Ventricle size normal. Mild atrophy. Chronic infarct left parietal lobe with encephalomalacia and mild calcification. Negative for acute infarct, hemorrhage, mass. Vascular: Negative for hyperdense vessel Skull: Negative Sinuses/Orbits: Paranasal sinuses clear.  Negative orbit Other: CT CERVICAL SPINE FINDINGS Alignment: Slight anterolisthesis C4-5.  Mild cervical kyphosis. Skull base and vertebrae: Negative for fracture. Soft tissues and spinal canal: Hypoplastic left  thyroid. 2 cm nodule right thyroid. No enlarged lymph nodes in the neck Disc levels: Disc degeneration and spurring C4 through C7. This is most severe at C5-6 with diffuse uncinate spurring as well as a calcified extruded disc fragment extending caudally. There is moderate spinal stenosis. Severe right foraminal encroachment and moderate left foraminal encroachment at C5-6. Upper chest: Lung apices clear bilaterally Other: None IMPRESSION: 1. No acute intracranial abnormality 2. Negative for cervical spine fracture 3. Cervical spine degenerative change with moderate spinal stenosis at C5-6. Electronically Signed   By: Marlan Palau M.D.   On: 06/13/2020 16:01   MR BRAIN WO CONTRAST  Result Date: 06/17/2020 CLINICAL DATA:  Stroke suspected. EXAM: MRI HEAD WITHOUT CONTRAST TECHNIQUE: Multiplanar, multiecho pulse sequences of the brain and surrounding structures were obtained without intravenous contrast. COMPARISON:  CT head June 13, 2020. FINDINGS: Brain: Multiple small infarcts involving the right basal ganglia, including the right caudate, anterior limb right internal capsule, and anterior putamen. Mild associated edema without mass effect. No midline shift. Large remote infarct involving the left parietal lobe with encephalomalacia, gliosis, and areas of internal mineralization, as seen on recent CT head. No mass lesion. No extra-axial fluid collection. No hydrocephalus. No evidence of acute hemorrhage. Generalized cerebral atrophy with ex vacuo ventricular dilation additional mild scattered T2/FLAIR white matter hyperintensities, likely related to chronic microvascular ischemic disease. Vascular: Major arterial flow voids at the skull base are maintained; however, the right paraclinoid ICA flow void is poorly visualized (series 6, image 10). Skull and upper cervical spine: Normal marrow signal. Sinuses/Orbits: Mild remote right medial orbital wall fracture/deformity. Orbits otherwise unremarkable.  Sinuses are clear. Other: No mastoid effusions. IMPRESSION: 1. Multiple small infarcts involving the right basal ganglia, as detailed above. 2. The right paraclinoid ICA flow void is poorly visualized. While this could potentially relate to slice selection, given the above findings recommend CTA of the head to evaluate for underlying stenosis/occlusion. These  results will be called to the ordering clinician or representative by the Radiologist Assistant, and communication documented in the PACS or [employer]. Electronically Signed   By: Feliberto Harts MD   On: 06/17/2020 11:07   CT HIP LEFT WO CONTRAST  Result Date: 06/14/2020 CLINICAL DATA:  Chronic hip pain on. EXAM: CT OF THE LEFT HIP WITHOUT CONTRAST TECHNIQUE: Multidetector CT imaging of the left hip was performed according to the standard protocol. Multiplanar CT image reconstructions were also generated. COMPARISON:  None. 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 There are degenerative changes of the left hip without evidence for an acute displaced fracture or dislocation. There are advanced degenerative changes of the lower lumbar spine. Ligaments Suboptimally assessed by CT. Muscles and Tendons No hematoma. Soft tissues The visualized urinary bladder is unremarkable. There is scattered sigmoid diverticula without CT evidence for diverticulitis. There are atherosclerotic changes of the visualized abdominal aorta. IMPRESSION: 1. 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 2. Degenerative changes of the left hip without evidence for an acute displaced fracture or dislocation. 3. 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 Aortic Atherosclerosis (ICD10-I70.0). Electronically Signed   By: Katherine Mantle M.D.   On: 06/14/2020 20:55   DG Knee Complete 4 Views Left  Result Date: 06/13/2020 CLINICAL DATA:  Left knee pain  after fall several days ago. EXAM: LEFT KNEE - COMPLETE 4+ VIEW COMPARISON:  None. FINDINGS: No evidence of fracture, dislocation, or joint effusion. Mild narrowing of medial and lateral joint spaces are noted with osteophyte formation. Severe narrowing of patellofemoral space is noted. Soft tissues are unremarkable. IMPRESSION: Tricompartmental degenerative joint disease. No acute abnormality seen in the left knee. Electronically Signed   By: Lupita Raider M.D.   On: 06/13/2020 16:43   ECHOCARDIOGRAM COMPLETE  Result Date: 06/15/2020    ECHOCARDIOGRAM REPORT   Patient Name:   LETIZIA HOOK Date of Exam: 06/15/2020 Medical Rec #:  [id number]       Height: Accession #:    [id number]      Weight:       240.5 lb Date of Birth:  24-Sep-1950       BSA:          2.284 m Patient Age:    69 years        BP:           146/61 mmHg Patient Gender: F               HR:           57 bpm. Exam Location:  Inpatient Procedure: 2D Echo, Color Doppler and Cardiac Doppler Indications:    I48.92* Unspecified atrial flutter  History:        Patient has no prior history of Echocardiogram examinations.  Sonographer:    Irving Burton Senior RDCS Referring Phys: [phone] SEYED A SHAHMEHDI IMPRESSIONS  1. Left ventricular ejection fraction, by estimation, is 55 to 60%. The left ventricle has normal function. The left ventricle has no regional wall motion abnormalities. There is mild concentric left ventricular hypertrophy. Left ventricular diastolic parameters are consistent with Grade I diastolic dysfunction (impaired relaxation).  2. Right ventricular systolic function is low normal. The right ventricular size is normal. There is normal pulmonary artery systolic pressure.  3. The mitral valve is grossly normal. No evidence of mitral valve regurgitation.  4. The aortic valve is tricuspid. There  is mild thickening of the aortic valve. Aortic valve regurgitation is not visualized. No aortic stenosis is present. Comparison(s): No prior  Echocardiogram. FINDINGS  Left Ventricle: Left ventricular ejection fraction, by estimation, is 55 to 60%. The left ventricle has normal function. The left ventricle has no regional wall motion abnormalities. The left ventricular internal cavity size was normal in size. There is  mild concentric left ventricular hypertrophy. Left ventricular diastolic parameters are consistent with Grade I diastolic dysfunction (impaired relaxation). Right Ventricle: The right ventricular size is normal. Right vetricular wall thickness was not well visualized. Right ventricular systolic function is low normal. There is normal pulmonary artery systolic pressure. The tricuspid regurgitant velocity is 2.81 m/s, and with an assumed right atrial pressure of 3 mmHg, the estimated right ventricular systolic pressure is 34.6 mmHg. Left Atrium: Left atrial size was normal in size. Right Atrium: Right atrial size was normal in size. Pericardium: There is no evidence of pericardial effusion. Mitral Valve: The mitral valve is grossly normal. Mild mitral annular calcification. No evidence of mitral valve regurgitation. Tricuspid Valve: The tricuspid valve is grossly normal. Tricuspid valve regurgitation is not demonstrated. Aortic Valve: The aortic valve is tricuspid. There is mild thickening of the aortic valve. Aortic valve regurgitation is not visualized. No aortic stenosis is present. Pulmonic Valve: The pulmonic valve was grossly normal. Pulmonic valve regurgitation is trivial. No evidence of pulmonic stenosis. Aorta: The aortic root and ascending aorta are structurally normal, with no evidence of dilitation. IAS/Shunts: The atrial septum is grossly normal.  LEFT VENTRICLE PLAX 2D LVIDd:         4.70 cm  Diastology LVIDs:         2.80 cm  LV e' medial:    5.22 cm/s LV PW:         1.40 cm  LV E/e' medial:  16.2 LV IVS:        1.10 cm  LV e' lateral:   7.29 cm/s LVOT diam:     2.10 cm  LV E/e' lateral: 11.6 LV SV:         78 LV SV Index:   34  LVOT Area:     3.46 cm  RIGHT VENTRICLE RV S prime:     9.79 cm/s TAPSE (M-mode): 2.0 cm LEFT ATRIUM           Index       RIGHT ATRIUM           Index LA diam:      4.20 cm 1.84 cm/m  RA Area:     16.50 cm LA Vol (A2C): 55.1 ml 24.13 ml/m RA Volume:   41.10 ml  18.00 ml/m LA Vol (A4C): 67.7 ml 29.65 ml/m  AORTIC VALVE LVOT Vmax:   104.00 cm/s LVOT Vmean:  75.600 cm/s LVOT VTI:    0.226 m  AORTA Ao Root diam: 3.40 cm Ao Asc diam:  3.60 cm MITRAL VALVE               TRICUSPID VALVE MV Area (PHT): 2.83 cm    TR Peak grad:   31.6 mmHg MV Decel Time: 268 msec    TR Vmax:        281.00 cm/s MV E velocity: 84.80 cm/s MV A velocity: 90.40 cm/s  SHUNTS MV E/A ratio:  0.94        Systemic VTI:  0.23 m  Systemic Diam: 2.10 cm Riley Lam MD Electronically signed by Riley Lam MD Signature Date/Time: 06/15/2020/1:16:06 PM    Final    DG Hip Unilat W or Wo Pelvis 2-3 Views Left  Result Date: 06/13/2020 CLINICAL DATA:  Acute left hip pain after fall several days ago. EXAM: DG HIP (WITH OR WITHOUT PELVIS) 2-3V LEFT COMPARISON:  None. FINDINGS: There is no evidence of hip fracture or dislocation. There is no evidence of arthropathy or other focal bone abnormality. IMPRESSION: Negative. Electronically Signed   By: Lupita Raider M.D.   On: 06/13/2020 16:44   VAS [place] LOWER EXTREMITY VENOUS (DVT)  Result Date: 06/14/2020  Lower Venous DVT Study Indications: Swelling.  Limitations: Patient's inability to move left leg. Comparison Study: No prior study Performing Technologist: Sherren Kerns RVS  Examination Guidelines: A complete evaluation includes B-mode imaging, spectral Doppler, color Doppler, and power Doppler as needed of all accessible portions of each vessel. Bilateral testing is considered an integral part of a complete examination. Limited examinations for reoccurring indications may be performed as noted. The reflux portion of the exam is performed with the patient  in reverse Trendelenburg.  Right Technical Findings: Right leg not evaluated.  +---------+---------------+---------+-----------+----------+-------------------+ LEFT     CompressibilityPhasicitySpontaneityPropertiesThrombus Aging      +---------+---------------+---------+-----------+----------+-------------------+ CFV      Full           Yes      Yes                                      +---------+---------------+---------+-----------+----------+-------------------+ SFJ      Full                                                             +---------+---------------+---------+-----------+----------+-------------------+ FV Prox  Full                                                             +---------+---------------+---------+-----------+----------+-------------------+ FV Mid   Full                                                             +---------+---------------+---------+-----------+----------+-------------------+ FV DistalFull                                                             +---------+---------------+---------+-----------+----------+-------------------+ PFV      Full                                                             +---------+---------------+---------+-----------+----------+-------------------+  POP                     Yes      Yes                  patent by color and                                                       Doppler             +---------+---------------+---------+-----------+----------+-------------------+ PTV      Full                                                             +---------+---------------+---------+-----------+----------+-------------------+ PERO     Full                                                             +---------+---------------+---------+-----------+----------+-------------------+     Summary: LEFT: - There is no evidence of deep vein thrombosis in the lower  extremity. However, portions of this examination were limited- see technologist comments above.  *See table(s) above for measurements and observations. Electronically signed by Coral Else MD on 06/14/2020 at 7:58:01 PM.    Final     SIGNED: Hughie Closs, MD [facility],  Pager (please use [email or web address] to page/text) Please use Epic Secure Chat for non-urgent communication (7AM-7PM)  If 7PM-7AM, please contact night-coverage [email or web address], 06/17/2020, 2:43 PM

## 2020-06-17 NOTE — Progress Notes (Signed)
Was assessing patient and she was unable to move right leg much due to right lower leg pain. Patient stated,I did had tell the Doctor." M.D. please assess

## 2020-06-17 NOTE — Consult Note (Addendum)
Neurology Consultation Reason for Consult: stroke Referring Physician: Dr. Jacqulyn Bath  CC: AMS, worsening confusion.  History is obtained from: patient and her son, as well as chart/staff.  HPI: Kristy Jensen is a 70 y.o. female with PMH of stroke in 2019. She was living in [place] at the time. She had visual field defect, diplopia, and some confusion after this, but the confusion has been getting worse, recently. She was moved from [place] to be near her son, who has become increasingly concerned. He reports that she falls on a regular basis. She denies palpitations. There is some dizziness prior to the falls, however. Her head doesn't feel right. As part of her AMS workup a brain MRI was done and this revealed right caudate/putamen multifocal strokes. The old left parietal/watershed territory stroke is also apparent. Echocardiogram reveals no heart failure or significant valvular pathology. EKG telemetry has thus far been OK. She was escalated from ASA/statin, to DAPT x 21 days. We are asked to consult for further recommendations.   ROS: A 14 point ROS was performed and is negative except as noted in the HPI. Evasive answers, sometimes declines to answer straightforward questions, or changes the subject.   Past Medical History:  Diagnosis Date  . Hyperlipidemia   . Prediabetes   . Stroke [facility])    Family History  Problem Relation Age of Onset  . Heart disease Neg Hx    Social History:  reports that she has never smoked. She has never used smokeless tobacco. She reports current alcohol use. She reports that she does not use drugs.  Exam: Current vital signs: BP 136/65 (BP Location: Left Arm)   Pulse (!) 108   Temp 98.9 F (37.2 C) (Oral)   Resp 20   Wt 114.2 kg   SpO2 95%  Vital signs in last 24 hours: Temp:  [98.6 F (37 C)-100.4 F (38 C)] 98.9 F (37.2 C) (02/15 1705) Pulse Rate:  [63-110] 108 (02/15 1705) Resp:  [20-25] 20 (02/15 1705) BP: (134-184)/(63-90) 136/65 (02/15  1705) SpO2:  [95 %-97 %] 95 % (02/15 1705) Weight:  [114.2 kg] 114.2 kg (02/15 0331)   Physical Exam  Constitutional: Appears well-developed and well-nourished.  Psych: Affect appropriate to situation Eyes: No scleral injection HENT: No OP obstrucion MSK: no joint deformities.  Cardiovascular: Normal rate and regular rhythm.  Respiratory: Effort normal, non-labored breathing GI: Soft.  No distension. There is no tenderness.  Skin: WDI  Neuro: Mental Status: Patient is awake, alert, but has flat affect, and gives truncated and perfunctory answers. She seems disinterested in the whole affair. Refused to give her age, or month, for example. She has difficulty naming parts of objects and uncommon objects, sometimes requiring cues. She has difficulty with complex commands, especially two-step or three-step commands involving prepositional, passive or possessive sentence constructions. Retrieval memory trouble is suspected, but she may have been refusing to supply answers to some of the questions that I asked. Cranial Nerves: II: Visual Fields are notable for impairment in the right lower > upper field. Pupils are equal, round, and reactive to light. She has a tendency to close the right eye when talking. III,IV, VI: EOMI without ptosis but she endorses diploplia worse with gaze to the left, present on and off since her stroke in 2019.  V: Facial sensation is symmetric to temperature VII: Facial movement is symmetric.  VIII: hearing is intact to voice X: Uvula elevates symmetrically XI: Shoulder shrug is symmetric. XII: tongue is midline without atrophy or  fasciculations.  Motor: Tone is normal. Bulk is normal. 5/5 strength was present in all four extremities. There is pronation and unilateral tremor on the left, but no downward drift. She indicates that the tremor has been there "awhile," but again vague time frame. Sensory: Sensation is symmetric to light touch and temperature in the arms  and legs. Extinction to DSS on the right. Deep Tendon Reflexes: 2+ and symmetric in the biceps and patellae. Plantars: Toes are downgoing bilaterally. Cerebellar: FNF and HKS are intact bilaterally.  I have reviewed labs in epic and the results pertinent to this consultation are: Lab Results  Component Value Date   LDLCALC 105 (H) 06/17/2020   Lab Results  Component Value Date   HGBA1C 6.0 (H) 06/14/2020     I have reviewed the images obtained: MRI examination of the brain and CTA reviewed, and echocardiogram report. Punctate zones of infarction (acute with matched DWI/ADC abnormality) in the right caudate/putamen. I see no brainstem abnormality on FLAIR to help explain the diplopia. However, periaquaductal gray hyperintensity is noted.  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 - left sided tremor, which is reportedly old, but which is also compatible with the right basal ganglia infarction.  Recommendations: 1) Agree with DAPT x 21 days 2) 30-day event monitor to screen for PAF as an etiology. 3) check thiamine, and then initiate empiric treatment. 4) I believe she will benefit from a more protected living environment, given her multiple falls, right-sided extinction to DSS, aphasia, etc. 5) fluoxetine 10 mg/d to promote stroke recovery 6) Outpatient  follow up with the stroke team. 7) will place her on the stroke list for follow up so they may add any additional recommendations that they deem necessary. For example, may consider EEG to see if her prodromal symptoms prior to falls are seizure-related, perhaps due to old left hemisphere stroke.  Thank you.   Aram Candela Angeleen Horney

## 2020-06-18 DIAGNOSIS — I63411 Cerebral infarction due to embolism of right middle cerebral artery: Secondary | ICD-10-CM

## 2020-06-18 LAB — CBC
HCT: 32.9 % — ABNORMAL LOW (ref 36.0–46.0)
Hemoglobin: 11.1 g/dL — ABNORMAL LOW (ref 12.0–15.0)
MCH: 28 pg (ref 26.0–34.0)
MCHC: 33.7 g/dL (ref 30.0–36.0)
MCV: 83.1 fL (ref 80.0–100.0)
Platelets: 272 10*3/uL (ref 150–400)
RBC: 3.96 MIL/uL (ref 3.87–5.11)
RDW: 13.9 % (ref 11.5–15.5)
WBC: 8.8 10*3/uL (ref 4.0–10.5)
nRBC: 0 % (ref 0.0–0.2)

## 2020-06-18 LAB — GLUCOSE, CAPILLARY
Glucose-Capillary: 124 mg/dL — ABNORMAL HIGH (ref 70–99)
Glucose-Capillary: 191 mg/dL — ABNORMAL HIGH (ref 70–99)
Glucose-Capillary: 108 mg/dL — ABNORMAL HIGH (ref 70–99)
Glucose-Capillary: 128 mg/dL — ABNORMAL HIGH (ref 70–99)

## 2020-06-18 LAB — CULTURE, BLOOD (ROUTINE X 2)
Culture: NO GROWTH
Culture: NO GROWTH
Special Requests: ADEQUATE

## 2020-06-18 LAB — BASIC METABOLIC PANEL
Anion gap: 10 (ref 5–15)
BUN: 15 mg/dL (ref 8–23)
CO2: 22 mmol/L (ref 22–32)
Calcium: 9.1 mg/dL (ref 8.9–10.3)
Chloride: 105 mmol/L (ref 98–111)
Creatinine, Ser: 0.86 mg/dL (ref 0.44–1.00)
GFR, Estimated: >60 mL/min (ref >60)
Glucose, Bld: 118 mg/dL — ABNORMAL HIGH (ref 70–99)
Potassium: 4 mmol/L (ref 3.5–5.1)
Sodium: 137 mmol/L (ref 135–145)

## 2020-06-18 LAB — SARS CORONAVIRUS 2 (TAT 6-24 HRS): SARS Coronavirus 2: NEGATIVE

## 2020-06-18 MED ORDER — ATORVASTATIN CALCIUM 80 MG PO TABS
80.0000 mg | ORAL_TABLET | Freq: Every day | ORAL | Status: DC
  Administered 2020-06-18: 80 mg via ORAL
  Filled 2020-06-18: qty 1

## 2020-06-18 NOTE — Progress Notes (Addendum)
Occupational Therapy Treatment Patient Details Name: Kristy Jensen MRN: [id number] DOB: 22-May-1950 Today's Date: 06/18/2020    History of present illness 70 yo female with onset of mult falls since recently moving from [place] was brought to hosp.  Pt has increased confusion prior to move, new UTI and metabolic encephalopathy. Additionally has hyperglycemia, run of v-tach.  PMHx:  CVA 2019. Pt also presents with history of visual field defect and diplopia. MRI brain revealed multiple small infarcts involving the right basal ganglia. An old left parietal stroke is also apparent.   OT comments  Pt seen with PT to progress with sit to stands x 3 with maxA +2 using and EOB ADL with set-upA. Pt limited due to cognitive deficits, decreased strength and decreased endurance. Pt appears to be deflecting cognitive deficits with humor and flat affect, but pt able to folow 1 step commands, but requires cues for all sequencing through tasks. Pt would benefit from continued OT skilled services for ADL, mobility and cognition. OT following acutely.    Follow Up Recommendations  SNF;Supervision/Assistance - 24 hour    Equipment Recommendations  3 in 1 bedside commode    Recommendations for Other Services      Precautions / Restrictions Precautions Precautions: Fall Restrictions Weight Bearing Restrictions: No       Mobility Bed Mobility Overal bed mobility: Needs Assistance Bed Mobility: Rolling;Sidelying to Sit;Sit to Supine Rolling: Max assist Sidelying to sit: Max assist;+2 for physical assistance;HOB elevated   Sit to supine: Max assist;+2 for physical assistance   General bed mobility comments: pt needs multimodal cues for task sequencing and slow to initiate transfer to EOB. Pt asking for help to move BLEs to EOB, but able to with assist  Transfers Overall transfer level: Needs assistance   Transfers: Sit to/from Stand Sit to Stand: Max assist;+2 physical assistance;From elevated  surface         General transfer comment: from elevated bed>Stedy, pt able to stand fully on 3rd attempt, then x1 rep from elevated Stedy seat; able to lift bottom off bed first 2 attempts but unable to fully extend hips, likely more 2/2 cognition than weakness    Balance Overall balance assessment: Needs assistance;History of Falls Sitting-balance support: Bilateral upper extremity supported;Feet supported Sitting balance-Leahy Scale: Poor Sitting balance - Comments: pt varying minA to maxA for seated balance; use of rails for stability   Standing balance support: Bilateral upper extremity supported Standing balance-Leahy Scale: Zero Standing balance comment: maxA for standing at [facility], only able to stand 5-10 seconds prior to needing seated break                           ADL either performed or assessed with clinical judgement   ADL Overall ADL's : Needs assistance/impaired Eating/Feeding: Set up;Sitting   Grooming: Set up;Sitting Grooming Details (indicate cue type and reason): sitting EOB ~20 mins and for ~ 2 mins pt performing EOB ADL tasks                             Functional mobility during ADLs: Maximal assistance;+2 for physical assistance;+2 for safety/equipment (standing with stedy) General ADL Comments: Pt limited due to cognitive deficits, decreased strength and decreased endurance. Pt appears to be deflecting cognitive deficits with humor and flat affect, but pt abel to folow 1 step commands, but requires cues for all sequencing.a     Vision  Vision Assessment?: No apparent visual deficits   Perception     Praxis      Cognition Arousal/Alertness: Awake/alert (drowsy initially) Behavior During Therapy: Flat affect Overall Cognitive Status: Impaired/Different from baseline Area of Impairment: Orientation;Attention;Memory;Following commands;Safety/judgement;Awareness;Problem solving                 Orientation Level:  Disoriented to;Time;Place   Memory: Decreased short-term memory Following Commands: Follows one step commands with increased time;Follows one step commands inconsistently Safety/Judgement: Decreased awareness of deficits;Decreased awareness of safety   Problem Solving: Slow processing;Difficulty sequencing;Requires verbal cues;Requires tactile cues General Comments: Pt able to use calendar to state date, oriented to self, place and location. Pt stating "I fell" as reason for coming to hospital - nothing about UTI. Pt uses humor to deflect cognitive deficits. Pt with very flat affect.        Exercises Exercises: Other exercises Other Exercises Other Exercises: seated BLE AROM: ankle pumps, hip flexion, LAQ 1x10 reps ea   Shoulder Instructions       General Comments Some redness noted on back and pt reports itchiness, but pt had been lying in bed all day and pt left with R hip floating.    Pertinent Vitals/ Pain       Pain Assessment: Faces Faces Pain Scale: Hurts little more Pain Location: LLE (pt seems most painful at L knee) Pain Descriptors / Indicators: Grimacing;Guarding Pain Intervention(s): Monitored during session;Repositioned  Home Living                                          Prior Functioning/Environment              Frequency  Min 2X/week        Progress Toward Goals  OT Goals(current goals can now be found in the care plan section)  Progress towards OT goals: Progressing toward goals  Acute Rehab OT Goals Patient Stated Goal: to get moving around more OT Goal Formulation: With patient Time For Goal Achievement: 06/29/20 Potential to Achieve Goals: Good ADL Goals Pt Will Perform Grooming: with modified independence;sitting Pt Will Transfer to Toilet: with mod assist;stand pivot transfer Additional ADL Goal #1: Pt will follow one step commands consistently during ADL and functional mobility completion. Additional ADL Goal #2: Pt  will complete bed mobility with moderate assistance in preparation for OOB ADL.  Plan Discharge plan remains appropriate    Co-evaluation    PT/OT/SLP Co-Evaluation/Treatment: Yes Reason for Co-Treatment: Complexity of the patient's impairments (multi-system involvement);To address functional/ADL transfers PT goals addressed during session: Mobility/safety with mobility;Balance;Strengthening/ROM OT goals addressed during session: ADL's and self-care      AM-PAC OT "6 Clicks" Daily Activity     Outcome Measure   Help from another person eating meals?: A Little Help from another person taking care of personal grooming?: A Little Help from another person toileting, which includes using toliet, bedpan, or urinal?: Total Help from another person bathing (including washing, rinsing, drying)?: Total Help from another person to put on and taking off regular upper body clothing?: A Lot Help from another person to put on and taking off regular lower body clothing?: Total 6 Click Score: 11    End of Session Equipment Utilized During Treatment: Gait belt  OT Visit Diagnosis: Unsteadiness on feet (R26.81);Muscle weakness (generalized) (M62.81)   Activity Tolerance Patient tolerated treatment well   Patient Left in bed;with  call bell/phone within reach;with bed alarm set   Nurse Communication Mobility status        Time: 1452-1535 OT Time Calculation (min): 43 min  Charges: OT General Charges $OT Visit: 1 Visit OT Treatments $Self Care/Home Management : 8-22 mins  Flora Lipps, OTR/L Acute Rehabilitation Services Pager: [phone] Office: [phone]    Abbagail Scaff  C 06/18/2020, 4:45 PM

## 2020-06-18 NOTE — Progress Notes (Signed)
Physical Therapy Treatment Patient Details Name: Kristy Jensen MRN: [id number] DOB: 1950/06/25 Today's Date: 06/18/2020    History of Present Illness 70 yo female with onset of mult falls since recently moving from [place] was brought to hosp.  Pt has increased confusion prior to move, new UTI and metabolic encephalopathy. Additionally has hyperglycemia, run of v-tach.  PMHx:  CVA 2019. Pt also presents with history of visual field defect and diplopia. MRI brain revealed multiple small infarcts involving the right basal ganglia. An old left parietal stroke is also apparent.    PT Comments    Pt received in supine, agreeable to therapy session and with good participation and improved tolerance for seated/standing tasks this date. Pt continues to demonstrate slow processing and difficulty initiating tasks, but able to tolerate sitting/standing transfers with +26maxA at [facility] and needed multiple attempts to reach upright posture. HR mostly 90's bpm during mobility tasks however briefly reading tachy up to 160 bpm for a few seconds, she denies dizziness/pain/and not dyspneic during this time; RN notified. Unable to initiate weight shifting in stance, will plan to attempt Stedy transfer to/from chair next session, will likely need mechanical lift pad in place for safety with return transfer due to cognition/global deconditioning. Pt continues to benefit from PT services to progress toward functional mobility goals. Continue to recommend SNF.  Follow Up Recommendations  SNF;Supervision/Assistance - 24 hour     Equipment Recommendations  None recommended by PT    Recommendations for Other Services       Precautions / Restrictions Precautions Precautions: Fall Restrictions Weight Bearing Restrictions: No    Mobility  Bed Mobility Overal bed mobility: Needs Assistance Bed Mobility: Rolling;Sidelying to Sit;Sit to Supine Rolling: Max assist Sidelying to sit: Max assist;+2 for physical  assistance;HOB elevated   Sit to supine: Max assist;+2 for physical assistance   General bed mobility comments: pt needs multimodal cues for task sequencing and slow to initiate transfer to EOB, pt asking for help with BLE but when asked to perform, able to initiate movement on her own toward EOB    Transfers Overall transfer level: Needs assistance   Transfers: Sit to/from Stand Sit to Stand: Max assist;+2 physical assistance;From elevated surface         General transfer comment: from elevated bed>Stedy, pt able to stand fully on 3rd attempt, then x1 rep from elevated Stedy seat; able to lift bottom off bed first 2 attempts but unable to fully extend hips, likely more 2/2 cognition than weakness  Ambulation/Gait             General Gait Details: unable to weight shift in stance   Stairs             Wheelchair Mobility    Modified Rankin (Stroke Patients Only)       Balance Overall balance assessment: Needs assistance;History of Falls Sitting-balance support: Bilateral upper extremity supported;Feet supported Sitting balance-Leahy Scale: Poor Sitting balance - Comments: pt varying minA to maxA for seated balance, improves with seated time but pt needing cues for use of R rail to steady herself; verbalized fear of falls   Standing balance support: Bilateral upper extremity supported Standing balance-Leahy Scale: Zero Standing balance comment: maxA for standing at [facility], only able to stand 5-10 seconds prior to needing seated break                            Cognition Arousal/Alertness: Awake/alert (drowsy initially) Behavior During  Therapy: Flat affect Overall Cognitive Status: Impaired/Different from baseline Area of Impairment: Orientation;Attention;Memory;Following commands;Safety/judgement;Awareness;Problem solving                 Orientation Level: Disoriented to;Time;Place   Memory: Decreased short-term memory Following Commands:  Follows one step commands with increased time;Follows one step commands inconsistently Safety/Judgement: Decreased awareness of deficits;Decreased awareness of safety   Problem Solving: Slow processing;Difficulty sequencing;Requires verbal cues;Requires tactile cues General Comments: Pt uses humor/subject change to disguise cognitive deficits, however able to state current day of month with visual hints to use calendar, unable to state month/year. Pt denies diplopia during session however per chart review has hx diplopia.      Exercises Other Exercises Other Exercises: seated BLE AROM: ankle pumps, hip flexion, LAQ 1x10 reps ea    General Comments General comments (skin integrity, edema, etc.): pt c/o itchy back however no notable skin breakdown or redness to back      Pertinent Vitals/Pain Pain Assessment: Faces Faces Pain Scale: Hurts little more Pain Location: LLE (pt seems most painful at L knee) Pain Descriptors / Indicators: Grimacing;Guarding Pain Intervention(s): Monitored during session;Repositioned    Home Living                      Prior Function            PT Goals (current goals can now be found in the care plan section) Acute Rehab PT Goals Patient Stated Goal: to get moving around more PT Goal Formulation: Patient unable to participate in goal setting Time For Goal Achievement: 06/28/20 Potential to Achieve Goals: Fair Progress towards PT goals: Progressing toward goals    Frequency    Min 3X/week      PT Plan Current plan remains appropriate    Co-evaluation PT/OT/SLP Co-Evaluation/Treatment: Yes Reason for Co-Treatment: Complexity of the patient's impairments (multi-system involvement);Necessary to address cognition/behavior during functional activity;To address functional/ADL transfers PT goals addressed during session: Mobility/safety with mobility;Balance;Strengthening/ROM        AM-PAC PT "6 Clicks" Mobility   Outcome Measure   Help needed turning from your back to your side while in a flat bed without using bedrails?: A Lot Help needed moving from lying on your back to sitting on the side of a flat bed without using bedrails?: A Lot Help needed moving to and from a bed to a chair (including a wheelchair)?: Total Help needed standing up from a chair using your arms (e.g., wheelchair or bedside chair)?: A Lot Help needed to walk in hospital room?: Total Help needed climbing 3-5 steps with a railing? : Total 6 Click Score: 9    End of Session Equipment Utilized During Treatment: Gait belt (transfer pads) Activity Tolerance: Patient tolerated treatment well Patient left: in bed;with call bell/phone within reach;with bed alarm set;with SCD's reapplied (HOB >30 degrees, pillow under R hip to offload and heels floated) Nurse Communication: Mobility status (pt would need lift for OOB, pt tachy briefly during seated activity) PT Visit Diagnosis: Other abnormalities of gait and mobility (R26.89);Difficulty in walking, not elsewhere classified (R26.2);Other (comment)     Time: 1749-4496 PT Time Calculation (min) (ACUTE ONLY): 41 min  Charges:  $Therapeutic Exercise: 8-22 mins $Therapeutic Activity: 8-22 mins                     Kristy Jensen P., PTA Acute Rehabilitation Services Pager: [phone] Office: [phone]   Kristy Jensen 06/18/2020, 4:08 PM

## 2020-06-18 NOTE — TOC Progression Note (Signed)
Transition of Care [facility]) - Progression Note    Patient Details  Name: Shermeka Rutt MRN: [id number] Date of Birth: 1950-11-26  Transition of Care [facility]) CM/SW Contact  Eduard Roux, [place] Phone Number: 06/18/2020, 1:00 PM  Clinical Narrative:     [facility] has confirmed bed offer- SNF states they have received authorization, patient can admit tomorrow  RN updated -covid test requested   Antony Blackbird, MSW, LCSW Clinical Social Worker   Expected Discharge Plan: Skilled Nursing Facility Barriers to Discharge: Insurance Authorization,Continued Medical Work up,SNF Pending bed offer  Expected Discharge Plan and Services Expected Discharge Plan: Skilled Nursing Facility In-house Referral: Clinical Social Work     Living arrangements for the past 2 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

## 2020-06-18 NOTE — Progress Notes (Addendum)
STROKE TEAM PROGRESS NOTE   INTERVAL HISTORY 70 y.o. female with PMH of stroke in 2019. She presents with an ongoing history of visual field defect, diplopia, and some confusion.  She has been experiencing multiple falls.  A MRI brain revealed multiple small infarcts involving the right basal ganglia. An old left parietal stroke is also apparent.  Afebrile. Vital signs stable. Neurological exam: Alert and oriented to person, place, and time.  Able to follow 1-2 step commands.  5/5 strength in bilateral upper extremities, 3/5 strength in bilateral lower extremities.  Visual fielde impairment in right lower  fields in both eyes.  PERRL, EOMI, worse diplopia with gaze to the left.  Patient states this is not new for her.   Would recommend starting DAPT with aspirin and plavix for 3 weeks then plavix alone.  Increase Lipitor to 80 daily as LDL is 105. Will need 30 day cardiac monitor at the time of discharge  to evaluate for PAF for possible cause of stroke.     Vitals:   06/18/20 0403 06/18/20 0730 06/18/20 0800 06/18/20 1130  BP: (!) 155/66 (!) 119/92 (!) 119/92 (!) 135/97  Pulse: 72 70 79 76  Resp: 20 16 20 20   Temp: 98.4 F (36.9 C) 98.4 F (36.9 C) 98.5 F (36.9 C) 97.7 F (36.5 C)  TempSrc: Oral Oral Oral Oral  SpO2: 97% 96% 98% 97%  Weight:       CBC:  Recent Labs  Lab 06/13/20 1431 06/13/20 1506 06/17/20 0104 06/18/20 0112  WBC 11.8*   < > 8.4 8.8  NEUTROABS 10.4*  --   --   --   HGB 12.0   < > 11.5* 11.1*  HCT 38.5   < > 36.2 32.9*  MCV 88.7   < > 84.2 83.1  PLT 49*   < > 288 272   < > = values in this interval not displayed.   Basic Metabolic Panel:  Recent Labs  Lab 06/17/20 0104 06/18/20 0112  NA 136 137  K 3.9 4.0  CL 106 105  CO2 23 22  GLUCOSE 114* 118*  BUN 15 15  CREATININE 0.86 0.86  CALCIUM 8.8* 9.1    Lipid Panel:  Recent Labs  Lab 06/17/20 0104  CHOL 162  TRIG 118  HDL 33*  CHOLHDL 4.9  VLDL 24  LDLCALC 841105*    HgbA1c:  Recent Labs   Lab 06/14/20 0337  HGBA1C 6.0*   Urine Drug Screen:  Recent Labs  Lab 06/13/20 1618  LABOPIA NONE DETECTED  COCAINSCRNUR NONE DETECTED  LABBENZ NONE DETECTED  AMPHETMU NONE DETECTED  THCU NONE DETECTED  LABBARB NONE DETECTED    Alcohol Level  Recent Labs  Lab 06/13/20 1431  ETH <10    IMAGING past 24 hours CT ANGIO HEAD W OR WO CONTRAST  Result Date: 06/17/2020 CLINICAL DATA:  Neuro deficit, stroke documented. EXAM: CT ANGIOGRAPHY HEAD AND NECK TECHNIQUE: Multidetector CT imaging of the head and neck was performed using the standard protocol during bolus administration of intravenous contrast. Multiplanar CT image reconstructions and MIPs were obtained to evaluate the vascular anatomy. Carotid stenosis measurements (when applicable) are obtained utilizing NASCET criteria, using the distal internal carotid diameter as the denominator. CONTRAST:  50mL OMNIPAQUE IOHEXOL 350 MG/ML SOLN COMPARISON:  MRI of the brain June 17, 2020. FINDINGS: CT HEAD FINDINGS Brain: A few small hypodense foci are seen within the right caudate LN form no with, corresponding to acute infarct seen on prior CT.  No other area of hypodensity to suggest new acute infarct. Area of encephalomalacia and gliosis in the left parieto-occipital region. Vascular: No hyperdense vessel. Calcified plaques in the bilateral carotid siphons. Skull: Normal. Negative for fracture or focal lesion. Sinuses: Imaged portions are clear. Orbits: No acute finding. Review of the MIP images confirms the above findings CTA NECK FINDINGS Aortic arch: Standard branching. Imaged portion shows no evidence of aneurysm or dissection. Calcified plaques in the aortic arch and at the origin of the left subclavian artery without hemodynamically significant stenosis. Right carotid system: Mild atherosclerotic changes in the right carotid bifurcation. No evidence of dissection, stenosis (50% or greater) or occlusion. Left carotid system: Mild  atherosclerotic changes in the left carotid bifurcation. No evidence of dissection, stenosis (50% or greater) or occlusion. Vertebral arteries: Codominant. Calcified atherosclerotic plaque at the origin of the right vertebral artery. No evidence of dissection, stenosis (50% or greater) or occlusion. Skeleton: Degenerative changes of the cervical spine. No aggressive bone lesion identified. Other neck: Enlarged right thyroid lobe with a hypodense 1.7 cm nodule. Upper chest: Negative. Review of the MIP images confirms the above findings CTA HEAD FINDINGS Anterior circulation: Heavily calcified plaques in the bilateral carotid siphons without hemodynamically significant stenosis. The bilateral MCA and ACA vascular trees are maintained. Posterior circulation: No significant stenosis, proximal occlusion, aneurysm, or vascular malformation. Venous sinuses: As permitted by contrast timing, patent. Anatomic variants: None. Review of the MIP images confirms the above findings IMPRESSION: 1. A few small hypodense foci are seen within the right caudate nucleus corresponding to acute infarct seen on prior CT. No other area of hypodensity to suggest new acute infarct. 2. Encephalomalacia and gliosis in the left parieto-occipital region. 3. Mild atherosclerotic changes in the carotid bifurcations and bilateral carotid siphons without hemodynamically significant stenosis. 4. Enlarged right thyroid lobe with a 1.7 cm nodule. 5. Aortic atherosclerosis. Aortic Atherosclerosis (ICD10-I70.0). Electronically Signed   By: Baldemar Lenis M.D.   On: 06/17/2020 15:47   CT ANGIO NECK W OR WO CONTRAST  Result Date: 06/17/2020 CLINICAL DATA:  Neuro deficit, stroke documented. EXAM: CT ANGIOGRAPHY HEAD AND NECK TECHNIQUE: Multidetector CT imaging of the head and neck was performed using the standard protocol during bolus administration of intravenous contrast. Multiplanar CT image reconstructions and MIPs were obtained to  evaluate the vascular anatomy. Carotid stenosis measurements (when applicable) are obtained utilizing NASCET criteria, using the distal internal carotid diameter as the denominator. CONTRAST:  34mL OMNIPAQUE IOHEXOL 350 MG/ML SOLN COMPARISON:  MRI of the brain June 17, 2020. FINDINGS: CT HEAD FINDINGS Brain: A few small hypodense foci are seen within the right caudate LN form no with, corresponding to acute infarct seen on prior CT. No other area of hypodensity to suggest new acute infarct. Area of encephalomalacia and gliosis in the left parieto-occipital region. Vascular: No hyperdense vessel. Calcified plaques in the bilateral carotid siphons. Skull: Normal. Negative for fracture or focal lesion. Sinuses: Imaged portions are clear. Orbits: No acute finding. Review of the MIP images confirms the above findings CTA NECK FINDINGS Aortic arch: Standard branching. Imaged portion shows no evidence of aneurysm or dissection. Calcified plaques in the aortic arch and at the origin of the left subclavian artery without hemodynamically significant stenosis. Right carotid system: Mild atherosclerotic changes in the right carotid bifurcation. No evidence of dissection, stenosis (50% or greater) or occlusion. Left carotid system: Mild atherosclerotic changes in the left carotid bifurcation. No evidence of dissection, stenosis (50% or greater) or occlusion.  Vertebral arteries: Codominant. Calcified atherosclerotic plaque at the origin of the right vertebral artery. No evidence of dissection, stenosis (50% or greater) or occlusion. Skeleton: Degenerative changes of the cervical spine. No aggressive bone lesion identified. Other neck: Enlarged right thyroid lobe with a hypodense 1.7 cm nodule. Upper chest: Negative. Review of the MIP images confirms the above findings CTA HEAD FINDINGS Anterior circulation: Heavily calcified plaques in the bilateral carotid siphons without hemodynamically significant stenosis. The bilateral  MCA and ACA vascular trees are maintained. Posterior circulation: No significant stenosis, proximal occlusion, aneurysm, or vascular malformation. Venous sinuses: As permitted by contrast timing, patent. Anatomic variants: None. Review of the MIP images confirms the above findings IMPRESSION: 1. A few small hypodense foci are seen within the right caudate nucleus corresponding to acute infarct seen on prior CT. No other area of hypodensity to suggest new acute infarct. 2. Encephalomalacia and gliosis in the left parieto-occipital region. 3. Mild atherosclerotic changes in the carotid bifurcations and bilateral carotid siphons without hemodynamically significant stenosis. 4. Enlarged right thyroid lobe with a 1.7 cm nodule. 5. Aortic atherosclerosis. Aortic Atherosclerosis (ICD10-I70.0). Electronically Signed   By: Baldemar Lenis M.D.   On: 06/17/2020 15:47    PHYSICAL EXAM Physical Exam  Constitutional: Appears well-developed and well-nourished.  Psych: Affect appropriate to situation Eyes: No scleral injection HENT: No OP obstrucion MSK: no joint deformities.  Cardiovascular: Normal rate and regular rhythm.  Respiratory: Effort normal, non-labored breathing GI: Soft.  No distension. There is no tenderness.  Skin: WDI  Neuro: Mental Status: Patient is awake, alert to person, place, and time.  Confused to situation.   She has difficulty with complex commands, especially two-step or three-step commands Cranial Nerves: II: Visual Fields are notable for  partial homonymous hemianopsia with mpairment in the right lower > upper field in both eyes. Pupils are equal, round, and reactive to light. She has a tendency to close the right eye when talking. III,IV, VI: EOMI without ptosis, diploplia worse with gaze to the left, present on and off since her stroke in 2019.  V: Facial sensation is symmetric to temperature VII: Facial movement is symmetric.  VIII: hearing is intact to  voice X: Uvula elevates symmetrically XI: Shoulder shrug is symmetric. XII: tongue is midline without atrophy or fasciculations.  Motor: Tone is normal. Bulk is normal. 5/5 strength in upper extremities. 3/5 strength in lower extremities There is pronation and unilateral tremor on the left, but no downward drift.  Sensory: Sensation is symmetric to light touch and temperature in the arms and legs.  Plantars: Toes are downgoing bilaterally. Cerebellar: FNF and HKS are intact bilaterally.   ASSESSMENT/PLAN [name] is a 70 y.o. female with history of HLD, stroke (2019), prediabetes presenting with visual field defect, diplopia, and confusion.    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    CTA head & neck:  few small hypodense foci are seen within the right caudate nucleus corresponding to acute infarct   MRI: Multiple small infarcts involving the right basal ganglia  2D Echo: EF: 55-60%, grade 1 diastolic dysfunction  LDL 105  HgbA1c 6.0  VTE prophylaxis - SCDs on, recommend starting lovenox 40mg  daily Diet: heart healthy 30 day event monitor at time of discharge to evaluate for PAF as an etiology  aspirin 81  mg daily and Eliquis 5mg  prior to admission, now on aspirin 81 mg daily and clopidogrel 75 mg daily. DAPT x 3 weeks then Plavix alone unless atrial flutter/fib is confirmed then change to eliquis  Therapy recommendations:  PT/OT: SNF;Supervision/Assistance - 24 hour,   Disposition:  pending  Hypertension  Home meds:  Metoprolol 25mg  daily  Started on amlodipine 5mg  daily   Stable . BP goal normotensive  Hyperlipidemia  Home meds: rosuvastatin  LDL 105, goal < 70   High intensity statin atorvastatin 80mg  daily  Continue statin at  discharge  Diabetes type II Controlled  Home meds: none  HgbA1c 6.0, goal < 7.0  CBGs Recent Labs    06/17/20 2114 06/18/20 0617 06/18/20 1129  GLUCAP 135* 124* 191*      SSI  Other Stroke Risk Factors  Advanced Age >/= 38   Obesity, BMI >/= 30 associated with increased stroke risk, recommend weight loss, diet and exercise as appropriate   Hx stroke (2019) on aspirin  Other Active Problems  Incidental finding: enlarged right thyroid lobe with a 1.7 cm nodule  UTI - check urine culture Hospital day # 5  Kristy Jensen [place] Nurse Practitioner 06/18/2020 1:30 pm I have personally obtained history,examined this patient, reviewed notes, independently viewed imaging studies, participated in medical decision making and plan of care.ROS completed by me personally and pertinent positives fully documented  I have made any additions or clarifications directly to the above note. Agree with note above.  Patient presented with confusion and has history of falls.  MRI shows patchy right basal ganglia infarct likely from small vessel disease but given prior history of embolic left parietal infarct as well as questionable history of a flutter documented in the ED with available for confirmation not sure if she needs to be on long-term anticoagulation.  Her confusion may also be related to underlying UTI.  Recommend check urine culture and consider antibiotics as per primary team.  If A-flutter diagnosis is confirmed she may need Eliquis otherwise to dual antiplatelet therapy of aspirin Plavix for 3 weeks followed by Plavix alone.  Increase Lipitor dose   due to elevated LDL.  Discussed with Dr. 06/20/20.  Greater than 50% time during this 35-minute visit was spent on counseling and coordination of care about her stroke discussion about evaluation treatment plan and answering questions. 76, MD Medical Director [facility] Pager: [phone] 06/18/2020 2:37  PM  To contact Stroke Continuity provider, please refer to Delia Heady. After hours, contact General Neurology

## 2020-06-18 NOTE — Progress Notes (Signed)
PROGRESS NOTE    Patient: Kristy Jensen                            PCP: Patient, No Pcp Per                    DOB: 24-Aug-1950            DOA: 06/13/2020 ZOX:[id number]             DOS: 06/18/2020, 1:48 PM   LOS: 5 days   Date of Service: The patient was seen and examined on 06/18/2020  PCP in [place] Dr. Marnee Guarneri [phone] (provided by her son)  Subjective:   Patient seen and examined.  She had no complaint.  She did not complain of any pain in the leg however she was having hard time lifting her left leg today.  Brief Narrative:   Kristy Jensen is a 70 y.o.f with PMH/o   CVA but otherwise no significant other known history who lives independently, brought in secondary to altered mental status. She apparently moved from [place] recently.  He moved her here because of increasing confusion.    Reportedly patient had a stroke in 2019.  Patient had fallen couple of times within a week before admission.  Upon arrival to ED, she was hemodynamically stable.  She was diagnosed with UTI.  She also had altered mental status. Head CT without contrast is negative.  COVID-19 screen is negative.  Troponin is 21 and flat. CT C-spine is also negative.  Chest x-ray and hip x-rays are at this point negative.    Patient was admitted with acute metabolic encephalopathy secondary to UTI.  She was treated with fosfomycin for that.  Patient's mental status improved somewhat but at baseline, she remains slightly confused.  MRI brain was ordered however patient had pacemaker which we were unsure if this was MRI compatible or not.  Eventually MRI brain was done which showed acute ischemic stroke.  Patient was supposed to take a statin and aspirin however she was not taking them.  There was some concern about possible brief atrial flutter and V. tach so cardiology was consulted.  There was no evidence of atrial flutter however cardiology started this patient on aspirin and atorvastatin.  When patient was diagnosed with  acute ischemic stroke, she was also added on Plavix.  Neurology was consulted.   Assessment & Plan:   Principal Problem:   Metabolic encephalopathy Active Problems:   CVA (cerebral vascular accident) (HCC)   Acute lower UTI   Hyperglycemia   Altered mental status   Toxic metabolic encephalopathy/UTI/acute ischemic stroke with a history of CVA: -Remained hemodynamically stable  -Alert oriented x2, likely baseline Her confusion apparently has improved from what she presented with.  UA slightly consistent with UTI for which she completed treatment with fosfomycin with the last dose being on 06/16/2020.  However with new finding of MRI brain which now shows multiple small infarcts involving right basal ganglia, her acute stroke could be the culprit for encephalopathy.  Today patient was unable to lift her left lower extremity.  In the past few days, she was unable to lift lower extremity because she refused that due to pain.  Currently she has no pain or tenderness.  I have requested reassessment by PT.  CT angiogram of head and neck ruled out any large vessel occlusion.  Continue DAPT for total 21 days followed by Plavix  as per recommendation by neurology.  TOC working on finding SNF placement.  30-day event monitor planned per cardiology.  Acute cystitis -Apparently patient has a penicillin allergy -not clear  -Patient completed course of fosfomycin. -Urine/blood cultures >> no growth to date   ?Thrombocytopenia -On admission 49 this morning 266 -Resolved  Hypoalbuminemia, hypocalcemia -We will monitorand  replete accordingly  Hyperglycemia:  CBG: 164, 138, 134 Noted hyperglycemia with no prior history of diabetes as far as we know.   Hemoglobin A1c: 6.0  Hypertension -We see no history of hypertension, not any medication Was added 5 mg of amlodipine here.  Blood pressure fairly stable with slightly elevated diastolic blood pressure.  Continue this and as needed  hydralazine.  Elevated troponin /run of V. Tach,???  Brief flutter -Troponins remain flat at 21, 21, 24 -Denies any chest pain, no EKG changes Per cardiology, there are no strips available which show any evidence of atrial flutter. -Currently normal sinus rhythm -Cardiology consulted --appreciate evaluation.  No indication of rate control medications. -Continue to monitor on monitored bed  Left leg pain/hip pain -X-ray negative, CT -reviewed negative for any acute changes fractures Old inferior rami fracture -Ultrasound left lower extremity negative for DVT in the left leg. - As needed analgesics PT on board.  Recommendations for SNF.  Patient did not complain of any leg pain today.  There was no tenderness or swelling on my examination today either.   ------------------------------------------------------------------------------------------------------------------------------------ Nutritional status:  The patient's BMI is: There is no height or weight on file to calculate BMI. I agree with the assessment and plan as outlined .Marland Kitchen  ----------------------------------------------------------------------------------------------------------------------------------- Cultures; Blood Cultures x 2 >> NGT Urine Culture  >>> NGT    Antimicrobials: Fosfomycin first dose 06/13/2020 Anticipating second dose 06/16/2020   Consultants: Neurology   ------------------------------------------------------------------------------------------------------------------------------------  DVT prophylaxis:  Lovenox SQ Code Status:   Code Status: Full Code  Family Communication: No family member present at bedsid.  I updated her son through over the phone about her stroke and answered several questions yesterday.  Admission status:   Status is: Inpatient  Remains inpatient appropriate because:Inpatient level of care appropriate due to severity of illness   Dispo: The patient is from: Home               Anticipated d/c is to: SNF.  Son in agreement according to [facility] note.              Anticipated d/c date is: 1 to 2 days              Patient currently is medically stable to d/c.   Difficult to place patient yes   Level of care: Telemetry Medical   Procedures:   No admission procedures for hospital encounter.     Antimicrobials:  Anti-infectives (From admission, onward)   Start     Dose/Rate Route Frequency Ordered Stop   06/16/20 0000  fosfomycin (MONUROL) packet 3 g  Status:  Discontinued        3 g Oral  Once 06/14/20 1052 06/14/20 1129   06/13/20 1800  fosfomycin (MONUROL) packet 3 g        3 g Oral  Once 06/13/20 1754 06/13/20 2014       Medication:  . amLODipine  5 mg Oral Daily  . aspirin EC  81 mg Oral Daily  . atorvastatin  80 mg Oral q1800  . clopidogrel  75 mg Oral Daily  . FLUoxetine  10 mg Oral Daily  .  insulin aspart  0-9 Units Subcutaneous TID WC  . thiamine  100 mg Oral Daily    acetaminophen **OR** acetaminophen, hydrALAZINE, ondansetron **OR** ondansetron (ZOFRAN) IV   Objective:   Vitals:   06/18/20 0403 06/18/20 0730 06/18/20 0800 06/18/20 1130  BP: (!) 155/66 (!) 119/92 (!) 119/92 (!) 135/97  Pulse: 72 70 79 76  Resp: 20 16 20 20   Temp: 98.4 F (36.9 C) 98.4 F (36.9 C) 98.5 F (36.9 C) 97.7 F (36.5 C)  TempSrc: Oral Oral Oral Oral  SpO2: 97% 96% 98% 97%  Weight:        Intake/Output Summary (Last 24 hours) at 06/18/2020 1348 Last data filed at 06/18/2020 1236 Gross per 24 hour  Intake --  Output 3175 ml  Net -3175 ml   Filed Weights   06/15/20 0258 06/16/20 0426 06/17/20 0331  Weight: 109.1 kg 116.1 kg 114.2 kg     Examination:   General exam: Appears calm and comfortable  Respiratory system: Clear to auscultation. Respiratory effort normal. Cardiovascular system: S1 & S2 heard, RRR. No JVD, murmurs, rubs, gallops or clicks. No pedal edema. Gastrointestinal system: Abdomen is nondistended, soft and nontender. No  organomegaly or masses felt. Normal bowel sounds heard. Central nervous system: Alert and oriented x2, left lower extremity weakness Extremities: Left lower extremity weakness Skin: No rashes, lesions or ulcers.  Psychiatry: Judgement and insight appear normal. Mood & affect appropriate.    ------------------------------------------------------------------------------------------------------------------------------------------    LABs:  CBC Latest Ref Rng & Units 06/18/2020 06/17/2020 06/16/2020  WBC 4.0 - 10.5 K/uL 8.8 8.4 9.0  Hemoglobin 12.0 - 15.0 g/dL 11.1(L) 11.5(L) 10.8(L)  Hematocrit 36.0 - 46.0 % 32.9(L) 36.2 32.0(L)  Platelets 150 - 400 K/uL 272 288 251   CMP Latest Ref Rng & Units 06/18/2020 06/17/2020 06/16/2020  Glucose 70 - 99 mg/dL 161(W118(H) 960(A114(H) 540(J108(H)  BUN 8 - 23 mg/dL 15 15 16   Creatinine 0.44 - 1.00 mg/dL 8.110.86 9.140.86 7.820.94  Sodium 135 - 145 mmol/L 137 136 137  Potassium 3.5 - 5.1 mmol/L 4.0 3.9 3.9  Chloride 98 - 111 mmol/L 105 106 107  CO2 22 - 32 mmol/L 22 23 20(L)  Calcium 8.9 - 10.3 mg/dL 9.1 9.5(A8.8(L) 2.1(H8.6(L)  Total Protein 6.5 - 8.1 g/dL - - -  Total Bilirubin 0.3 - 1.2 mg/dL - - -  Alkaline Phos 38 - 126 U/L - - -  AST 15 - 41 U/L - - -  ALT 0 - 44 U/L - - -       Micro Results Recent Results (from the past 240 hour(s))  Culture, blood (routine x 2)     Status: None   Collection Time: 06/13/20  6:52 PM   Specimen: BLOOD RIGHT FOREARM  Result Value Ref Range Status   Specimen Description BLOOD RIGHT FOREARM  Final   Special Requests   Final    BOTTLES DRAWN AEROBIC AND ANAEROBIC Blood Culture results may not be optimal due to an inadequate volume of blood received in culture bottles   Culture   Final    NO GROWTH 5 DAYS Performed at [facility], [address] [place], [place] [address]    Report Status 06/18/2020 FINAL  Final  Culture, blood (routine x 2)     Status: None   Collection Time: 06/13/20  7:10 PM   Specimen: BLOOD  Result Value Ref  Range Status   Specimen Description BLOOD RIGHT ANTECUBITAL  Final   Special Requests   Final  BOTTLES DRAWN AEROBIC AND ANAEROBIC Blood Culture adequate volume   Culture   Final    NO GROWTH 5 DAYS Performed at [facility], [address] [place], [place] [address]    Report Status 06/18/2020 FINAL  Final  Resp Panel by RT-PCR (Flu A&B, Covid) Nasopharyngeal Swab     Status: None   Collection Time: 06/13/20  8:20 PM   Specimen: Nasopharyngeal Swab; Nasopharyngeal(NP) swabs in vial transport medium  Result Value Ref Range Status   SARS Coronavirus 2 by RT PCR NEGATIVE NEGATIVE Final    Comment: (NOTE) SARS-CoV-2 target nucleic acids are NOT DETECTED.  The SARS-CoV-2 RNA is generally detectable in upper respiratory specimens during the acute phase of infection. The lowest concentration of SARS-CoV-2 viral copies this assay can detect is 138 copies/mL. A negative result does not preclude SARS-Cov-2 infection and should not be used as the sole basis for treatment or other patient management decisions. A negative result may occur with  improper specimen collection/handling, submission of specimen other than nasopharyngeal swab, presence of viral mutation(s) within the areas targeted by this assay, and inadequate number of viral copies(<138 copies/mL). A negative result must be combined with clinical observations, patient history, and epidemiological information. The expected result is Negative.  Fact Sheet for Patients:  BloggerCourse.com  Fact Sheet for Healthcare Providers:  SeriousBroker.it  This test is no t yet approved or cleared by the [place] FDA and  has been authorized for detection and/or diagnosis of SARS-CoV-2 by FDA under an Emergency Use Authorization (EUA). This EUA will remain  in effect (meaning this test can be used) for the duration of the COVID-19 declaration under Section 564(b)(1) of the Act,  21 U.S.C.section 360bbb-3(b)(1), unless the authorization is terminated  or revoked sooner.       Influenza A by PCR NEGATIVE NEGATIVE Final   Influenza B by PCR NEGATIVE NEGATIVE Final    Comment: (NOTE) The Xpert Xpress SARS-CoV-2/FLU/RSV plus assay is intended as an aid in the diagnosis of influenza from Nasopharyngeal swab specimens and should not be used as a sole basis for treatment. Nasal washings and aspirates are unacceptable for Xpert Xpress SARS-CoV-2/FLU/RSV testing.  Fact Sheet for Patients: BloggerCourse.com  Fact Sheet for Healthcare Providers: SeriousBroker.it  This test is not yet approved or cleared by the [place] FDA and has been authorized for detection and/or diagnosis of SARS-CoV-2 by FDA under an Emergency Use Authorization (EUA). This EUA will remain in effect (meaning this test can be used) for the duration of the COVID-19 declaration under Section 564(b)(1) of the Act, 21 U.S.C. section 360bbb-3(b)(1), unless the authorization is terminated or revoked.  Performed at [facility], [address] [place], [place] [address]     Radiology Reports CT ANGIO HEAD W OR WO CONTRAST  Result Date: 06/17/2020 CLINICAL DATA:  Neuro deficit, stroke documented. EXAM: CT ANGIOGRAPHY HEAD AND NECK TECHNIQUE: Multidetector CT imaging of the head and neck was performed using the standard protocol during bolus administration of intravenous contrast. Multiplanar CT image reconstructions and MIPs were obtained to evaluate the vascular anatomy. Carotid stenosis measurements (when applicable) are obtained utilizing NASCET criteria, using the distal internal carotid diameter as the denominator. CONTRAST:  67mL OMNIPAQUE IOHEXOL 350 MG/ML SOLN COMPARISON:  MRI of the brain June 17, 2020. FINDINGS: CT HEAD FINDINGS Brain: A few small hypodense foci are seen within the right caudate LN form no with, corresponding to  acute infarct seen on prior CT. No other  area of hypodensity to suggest new acute infarct. Area of encephalomalacia and gliosis in the left parieto-occipital region. Vascular: No hyperdense vessel. Calcified plaques in the bilateral carotid siphons. Skull: Normal. Negative for fracture or focal lesion. Sinuses: Imaged portions are clear. Orbits: No acute finding. Review of the MIP images confirms the above findings CTA NECK FINDINGS Aortic arch: Standard branching. Imaged portion shows no evidence of aneurysm or dissection. Calcified plaques in the aortic arch and at the origin of the left subclavian artery without hemodynamically significant stenosis. Right carotid system: Mild atherosclerotic changes in the right carotid bifurcation. No evidence of dissection, stenosis (50% or greater) or occlusion. Left carotid system: Mild atherosclerotic changes in the left carotid bifurcation. No evidence of dissection, stenosis (50% or greater) or occlusion. Vertebral arteries: Codominant. Calcified atherosclerotic plaque at the origin of the right vertebral artery. No evidence of dissection, stenosis (50% or greater) or occlusion. Skeleton: Degenerative changes of the cervical spine. No aggressive bone lesion identified. Other neck: Enlarged right thyroid lobe with a hypodense 1.7 cm nodule. Upper chest: Negative. Review of the MIP images confirms the above findings CTA HEAD FINDINGS Anterior circulation: Heavily calcified plaques in the bilateral carotid siphons without hemodynamically significant stenosis. The bilateral MCA and ACA vascular trees are maintained. Posterior circulation: No significant stenosis, proximal occlusion, aneurysm, or vascular malformation. Venous sinuses: As permitted by contrast timing, patent. Anatomic variants: None. Review of the MIP images confirms the above findings IMPRESSION: 1. A few small hypodense foci are seen within the right caudate nucleus corresponding to acute infarct seen on prior  CT. No other area of hypodensity to suggest new acute infarct. 2. Encephalomalacia and gliosis in the left parieto-occipital region. 3. Mild atherosclerotic changes in the carotid bifurcations and bilateral carotid siphons without hemodynamically significant stenosis. 4. Enlarged right thyroid lobe with a 1.7 cm nodule. 5. Aortic atherosclerosis. Aortic Atherosclerosis (ICD10-I70.0). Electronically Signed   By: Baldemar Lenis M.D.   On: 06/17/2020 15:47   DG Chest 2 View  Result Date: 06/13/2020 CLINICAL DATA:  Status post fall. EXAM: CHEST - 2 VIEW COMPARISON:  None. FINDINGS: The heart size and mediastinal contours are within normal limits. No pneumothorax or pleural effusion is noted. Mild bibasilar subsegmental atelectasis or scarring is noted. The visualized skeletal structures are unremarkable. IMPRESSION: Mild bibasilar subsegmental atelectasis or scarring. Electronically Signed   By: Lupita Raider M.D.   On: 06/13/2020 16:42   CT Head Wo Contrast  Result Date: 06/13/2020 CLINICAL DATA:  Head trauma EXAM: CT HEAD WITHOUT CONTRAST CT CERVICAL SPINE WITHOUT CONTRAST TECHNIQUE: Multidetector CT imaging of the head and cervical spine was performed following the standard protocol without intravenous contrast. Multiplanar CT image reconstructions of the cervical spine were also generated. COMPARISON:  None. FINDINGS: CT HEAD FINDINGS Brain: Ventricle size normal. Mild atrophy. Chronic infarct left parietal lobe with encephalomalacia and mild calcification. Negative for acute infarct, hemorrhage, mass. Vascular: Negative for hyperdense vessel Skull: Negative Sinuses/Orbits: Paranasal sinuses clear.  Negative orbit Other: CT CERVICAL SPINE FINDINGS Alignment: Slight anterolisthesis C4-5.  Mild cervical kyphosis. Skull base and vertebrae: Negative for fracture. Soft tissues and spinal canal: Hypoplastic left thyroid. 2 cm nodule right thyroid. No enlarged lymph nodes in the neck Disc levels:  Disc degeneration and spurring C4 through C7. This is most severe at C5-6 with diffuse uncinate spurring as well as a calcified extruded disc fragment extending caudally. There is moderate spinal stenosis. Severe right foraminal encroachment and moderate left foraminal encroachment at  C5-6. Upper chest: Lung apices clear bilaterally Other: None IMPRESSION: 1. No acute intracranial abnormality 2. Negative for cervical spine fracture 3. Cervical spine degenerative change with moderate spinal stenosis at C5-6. Electronically Signed   By: Marlan Palau M.D.   On: 06/13/2020 16:01   CT ANGIO NECK W OR WO CONTRAST  Result Date: 06/17/2020 CLINICAL DATA:  Neuro deficit, stroke documented. EXAM: CT ANGIOGRAPHY HEAD AND NECK TECHNIQUE: Multidetector CT imaging of the head and neck was performed using the standard protocol during bolus administration of intravenous contrast. Multiplanar CT image reconstructions and MIPs were obtained to evaluate the vascular anatomy. Carotid stenosis measurements (when applicable) are obtained utilizing NASCET criteria, using the distal internal carotid diameter as the denominator. CONTRAST:  50mL OMNIPAQUE IOHEXOL 350 MG/ML SOLN COMPARISON:  MRI of the brain June 17, 2020. FINDINGS: CT HEAD FINDINGS Brain: A few small hypodense foci are seen within the right caudate LN form no with, corresponding to acute infarct seen on prior CT. No other area of hypodensity to suggest new acute infarct. Area of encephalomalacia and gliosis in the left parieto-occipital region. Vascular: No hyperdense vessel. Calcified plaques in the bilateral carotid siphons. Skull: Normal. Negative for fracture or focal lesion. Sinuses: Imaged portions are clear. Orbits: No acute finding. Review of the MIP images confirms the above findings CTA NECK FINDINGS Aortic arch: Standard branching. Imaged portion shows no evidence of aneurysm or dissection. Calcified plaques in the aortic arch and at the origin of the  left subclavian artery without hemodynamically significant stenosis. Right carotid system: Mild atherosclerotic changes in the right carotid bifurcation. No evidence of dissection, stenosis (50% or greater) or occlusion. Left carotid system: Mild atherosclerotic changes in the left carotid bifurcation. No evidence of dissection, stenosis (50% or greater) or occlusion. Vertebral arteries: Codominant. Calcified atherosclerotic plaque at the origin of the right vertebral artery. No evidence of dissection, stenosis (50% or greater) or occlusion. Skeleton: Degenerative changes of the cervical spine. No aggressive bone lesion identified. Other neck: Enlarged right thyroid lobe with a hypodense 1.7 cm nodule. Upper chest: Negative. Review of the MIP images confirms the above findings CTA HEAD FINDINGS Anterior circulation: Heavily calcified plaques in the bilateral carotid siphons without hemodynamically significant stenosis. The bilateral MCA and ACA vascular trees are maintained. Posterior circulation: No significant stenosis, proximal occlusion, aneurysm, or vascular malformation. Venous sinuses: As permitted by contrast timing, patent. Anatomic variants: None. Review of the MIP images confirms the above findings IMPRESSION: 1. A few small hypodense foci are seen within the right caudate nucleus corresponding to acute infarct seen on prior CT. No other area of hypodensity to suggest new acute infarct. 2. Encephalomalacia and gliosis in the left parieto-occipital region. 3. Mild atherosclerotic changes in the carotid bifurcations and bilateral carotid siphons without hemodynamically significant stenosis. 4. Enlarged right thyroid lobe with a 1.7 cm nodule. 5. Aortic atherosclerosis. Aortic Atherosclerosis (ICD10-I70.0). Electronically Signed   By: Baldemar Lenis M.D.   On: 06/17/2020 15:47   CT Cervical Spine Wo Contrast  Result Date: 06/13/2020 CLINICAL DATA:  Head trauma EXAM: CT HEAD WITHOUT  CONTRAST CT CERVICAL SPINE WITHOUT CONTRAST TECHNIQUE: Multidetector CT imaging of the head and cervical spine was performed following the standard protocol without intravenous contrast. Multiplanar CT image reconstructions of the cervical spine were also generated. COMPARISON:  None. FINDINGS: CT HEAD FINDINGS Brain: Ventricle size normal. Mild atrophy. Chronic infarct left parietal lobe with encephalomalacia and mild calcification. Negative for acute infarct, hemorrhage, mass. Vascular: Negative for  hyperdense vessel Skull: Negative Sinuses/Orbits: Paranasal sinuses clear.  Negative orbit Other: CT CERVICAL SPINE FINDINGS Alignment: Slight anterolisthesis C4-5.  Mild cervical kyphosis. Skull base and vertebrae: Negative for fracture. Soft tissues and spinal canal: Hypoplastic left thyroid. 2 cm nodule right thyroid. No enlarged lymph nodes in the neck Disc levels: Disc degeneration and spurring C4 through C7. This is most severe at C5-6 with diffuse uncinate spurring as well as a calcified extruded disc fragment extending caudally. There is moderate spinal stenosis. Severe right foraminal encroachment and moderate left foraminal encroachment at C5-6. Upper chest: Lung apices clear bilaterally Other: None IMPRESSION: 1. No acute intracranial abnormality 2. Negative for cervical spine fracture 3. Cervical spine degenerative change with moderate spinal stenosis at C5-6. Electronically Signed   By: Marlan Palau M.D.   On: 06/13/2020 16:01   MR BRAIN WO CONTRAST  Result Date: 06/17/2020 CLINICAL DATA:  Stroke suspected. EXAM: MRI HEAD WITHOUT CONTRAST TECHNIQUE: Multiplanar, multiecho pulse sequences of the brain and surrounding structures were obtained without intravenous contrast. COMPARISON:  CT head June 13, 2020. FINDINGS: Brain: Multiple small infarcts involving the right basal ganglia, including the right caudate, anterior limb right internal capsule, and anterior putamen. Mild associated edema without  mass effect. No midline shift. Large remote infarct involving the left parietal lobe with encephalomalacia, gliosis, and areas of internal mineralization, as seen on recent CT head. No mass lesion. No extra-axial fluid collection. No hydrocephalus. No evidence of acute hemorrhage. Generalized cerebral atrophy with ex vacuo ventricular dilation additional mild scattered T2/FLAIR white matter hyperintensities, likely related to chronic microvascular ischemic disease. Vascular: Major arterial flow voids at the skull base are maintained; however, the right paraclinoid ICA flow void is poorly visualized (series 6, image 10). Skull and upper cervical spine: Normal marrow signal. Sinuses/Orbits: Mild remote right medial orbital wall fracture/deformity. Orbits otherwise unremarkable. Sinuses are clear. Other: No mastoid effusions. IMPRESSION: 1. Multiple small infarcts involving the right basal ganglia, as detailed above. 2. The right paraclinoid ICA flow void is poorly visualized. While this could potentially relate to slice selection, given the above findings recommend CTA of the head to evaluate for underlying stenosis/occlusion. These results will be called to the ordering clinician or representative by the Radiologist Assistant, and communication documented in the PACS or [employer]. Electronically Signed   By: Feliberto Harts MD   On: 06/17/2020 11:07   CT HIP LEFT WO CONTRAST  Result Date: 06/14/2020 CLINICAL DATA:  Chronic hip pain on. EXAM: CT OF THE LEFT HIP WITHOUT CONTRAST TECHNIQUE: Multidetector CT imaging of the left hip was performed according to the standard protocol. Multiplanar CT image reconstructions were also generated. COMPARISON:  None. 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 There are degenerative changes of the left hip without evidence for an acute displaced fracture or dislocation. There are advanced  degenerative changes of the lower lumbar spine. Ligaments Suboptimally assessed by CT. Muscles and Tendons No hematoma. Soft tissues The visualized urinary bladder is unremarkable. There is scattered sigmoid diverticula without CT evidence for diverticulitis. There are atherosclerotic changes of the visualized abdominal aorta. IMPRESSION: 1. 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 2. Degenerative changes of the left hip without evidence for an acute displaced fracture or dislocation. 3. 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 Aortic Atherosclerosis (ICD10-I70.0). Electronically Signed   By: Katherine Mantle M.D.   On: 06/14/2020 20:55   DG  Knee Complete 4 Views Left  Result Date: 06/13/2020 CLINICAL DATA:  Left knee pain after fall several days ago. EXAM: LEFT KNEE - COMPLETE 4+ VIEW COMPARISON:  None. FINDINGS: No evidence of fracture, dislocation, or joint effusion. Mild narrowing of medial and lateral joint spaces are noted with osteophyte formation. Severe narrowing of patellofemoral space is noted. Soft tissues are unremarkable. IMPRESSION: Tricompartmental degenerative joint disease. No acute abnormality seen in the left knee. Electronically Signed   By: Lupita Raider M.D.   On: 06/13/2020 16:43   ECHOCARDIOGRAM COMPLETE  Result Date: 06/15/2020    ECHOCARDIOGRAM REPORT   Patient Name:   MARRIAH SANDERLIN Date of Exam: 06/15/2020 Medical Rec #:  [id number]       Height: Accession #:    [id number]      Weight:       240.5 lb Date of Birth:  [birth year]       BSA:          2.284 m Patient Age:    69 years        BP:           146/61 mmHg Patient Gender: F               HR:           57 bpm. Exam Location:  Inpatient Procedure: 2D Echo, Color Doppler and Cardiac Doppler Indications:    I48.92* Unspecified atrial flutter  History:        Patient has no prior history of Echocardiogram examinations.  Sonographer:    Irving Burton Senior RDCS Referring Phys: [phone]  SEYED A SHAHMEHDI IMPRESSIONS  1. Left ventricular ejection fraction, by estimation, is 55 to 60%. The left ventricle has normal function. The left ventricle has no regional wall motion abnormalities. There is mild concentric left ventricular hypertrophy. Left ventricular diastolic parameters are consistent with Grade I diastolic dysfunction (impaired relaxation).  2. Right ventricular systolic function is low normal. The right ventricular size is normal. There is normal pulmonary artery systolic pressure.  3. The mitral valve is grossly normal. No evidence of mitral valve regurgitation.  4. The aortic valve is tricuspid. There is mild thickening of the aortic valve. Aortic valve regurgitation is not visualized. No aortic stenosis is present. Comparison(s): No prior Echocardiogram. FINDINGS  Left Ventricle: Left ventricular ejection fraction, by estimation, is 55 to 60%. The left ventricle has normal function. The left ventricle has no regional wall motion abnormalities. The left ventricular internal cavity size was normal in size. There is  mild concentric left ventricular hypertrophy. Left ventricular diastolic parameters are consistent with Grade I diastolic dysfunction (impaired relaxation). Right Ventricle: The right ventricular size is normal. Right vetricular wall thickness was not well visualized. Right ventricular systolic function is low normal. There is normal pulmonary artery systolic pressure. The tricuspid regurgitant velocity is 2.81 m/s, and with an assumed right atrial pressure of 3 mmHg, the estimated right ventricular systolic pressure is 34.6 mmHg. Left Atrium: Left atrial size was normal in size. Right Atrium: Right atrial size was normal in size. Pericardium: There is no evidence of pericardial effusion. Mitral Valve: The mitral valve is grossly normal. Mild mitral annular calcification. No evidence of mitral valve regurgitation. Tricuspid Valve: The tricuspid valve is grossly normal. Tricuspid  valve regurgitation is not demonstrated. Aortic Valve: The aortic valve is tricuspid. There is mild thickening of the aortic valve. Aortic valve regurgitation is not visualized. No aortic stenosis is present. Pulmonic Valve: The pulmonic valve  was grossly normal. Pulmonic valve regurgitation is trivial. No evidence of pulmonic stenosis. Aorta: The aortic root and ascending aorta are structurally normal, with no evidence of dilitation. IAS/Shunts: The atrial septum is grossly normal.  LEFT VENTRICLE PLAX 2D LVIDd:         4.70 cm  Diastology LVIDs:         2.80 cm  LV e' medial:    5.22 cm/s LV PW:         1.40 cm  LV E/e' medial:  16.2 LV IVS:        1.10 cm  LV e' lateral:   7.29 cm/s LVOT diam:     2.10 cm  LV E/e' lateral: 11.6 LV SV:         78 LV SV Index:   34 LVOT Area:     3.46 cm  RIGHT VENTRICLE RV S prime:     9.79 cm/s TAPSE (M-mode): 2.0 cm LEFT ATRIUM           Index       RIGHT ATRIUM           Index LA diam:      4.20 cm 1.84 cm/m  RA Area:     16.50 cm LA Vol (A2C): 55.1 ml 24.13 ml/m RA Volume:   41.10 ml  18.00 ml/m LA Vol (A4C): 67.7 ml 29.65 ml/m  AORTIC VALVE LVOT Vmax:   104.00 cm/s LVOT Vmean:  75.600 cm/s LVOT VTI:    0.226 m  AORTA Ao Root diam: 3.40 cm Ao Asc diam:  3.60 cm MITRAL VALVE               TRICUSPID VALVE MV Area (PHT): 2.83 cm    TR Peak grad:   31.6 mmHg MV Decel Time: 268 msec    TR Vmax:        281.00 cm/s MV E velocity: 84.80 cm/s MV A velocity: 90.40 cm/s  SHUNTS MV E/A ratio:  0.94        Systemic VTI:  0.23 m                            Systemic Diam: 2.10 cm Riley Lam MD Electronically signed by Riley Lam MD Signature Date/Time: 06/15/2020/1:16:06 PM    Final    DG Hip Unilat W or Wo Pelvis 2-3 Views Left  Result Date: 06/13/2020 CLINICAL DATA:  Acute left hip pain after fall several days ago. EXAM: DG HIP (WITH OR WITHOUT PELVIS) 2-3V LEFT COMPARISON:  None. FINDINGS: There is no evidence of hip fracture or dislocation. There is no  evidence of arthropathy or other focal bone abnormality. IMPRESSION: Negative. Electronically Signed   By: Lupita Raider M.D.   On: 06/13/2020 16:44   VAS [place] LOWER EXTREMITY VENOUS (DVT)  Result Date: 06/14/2020  Lower Venous DVT Study Indications: Swelling.  Limitations: Patient's inability to move left leg. Comparison Study: No prior study Performing Technologist: Sherren Kerns RVS  Examination Guidelines: A complete evaluation includes B-mode imaging, spectral Doppler, color Doppler, and power Doppler as needed of all accessible portions of each vessel. Bilateral testing is considered an integral part of a complete examination. Limited examinations for reoccurring indications may be performed as noted. The reflux portion of the exam is performed with the patient in reverse Trendelenburg.  Right Technical Findings: Right leg not evaluated.  +---------+---------------+---------+-----------+----------+-------------------+ LEFT     CompressibilityPhasicitySpontaneityPropertiesThrombus Aging      +---------+---------------+---------+-----------+----------+-------------------+  CFV      Full           Yes      Yes                                      +---------+---------------+---------+-----------+----------+-------------------+ SFJ      Full                                                             +---------+---------------+---------+-----------+----------+-------------------+ FV Prox  Full                                                             +---------+---------------+---------+-----------+----------+-------------------+ FV Mid   Full                                                             +---------+---------------+---------+-----------+----------+-------------------+ FV DistalFull                                                             +---------+---------------+---------+-----------+----------+-------------------+ PFV      Full                                                              +---------+---------------+---------+-----------+----------+-------------------+ POP                     Yes      Yes                  patent by color and                                                       Doppler             +---------+---------------+---------+-----------+----------+-------------------+ PTV      Full                                                             +---------+---------------+---------+-----------+----------+-------------------+ PERO     Full                                                             +---------+---------------+---------+-----------+----------+-------------------+  Summary: LEFT: - There is no evidence of deep vein thrombosis in the lower extremity. However, portions of this examination were limited- see technologist comments above.  *See table(s) above for measurements and observations. Electronically signed by Coral Else MD on 06/14/2020 at 7:58:01 PM.    Final     SIGNED: Hughie Closs, MD [facility],  Pager (please use [email or web address] to page/text) Please use Epic Secure Chat for non-urgent communication (7AM-7PM)  If 7PM-7AM, please contact night-coverage [email or web address], 06/18/2020, 1:48 PM

## 2020-06-19 LAB — BASIC METABOLIC PANEL
Anion gap: 8 (ref 5–15)
BUN: 18 mg/dL (ref 8–23)
CO2: 24 mmol/L (ref 22–32)
Calcium: 9 mg/dL (ref 8.9–10.3)
Chloride: 104 mmol/L (ref 98–111)
Creatinine, Ser: 0.87 mg/dL (ref 0.44–1.00)
GFR, Estimated: >60 mL/min (ref >60)
Glucose, Bld: 118 mg/dL — ABNORMAL HIGH (ref 70–99)
Potassium: 4 mmol/L (ref 3.5–5.1)
Sodium: 136 mmol/L (ref 135–145)

## 2020-06-19 LAB — GLUCOSE, CAPILLARY
Glucose-Capillary: 122 mg/dL — ABNORMAL HIGH (ref 70–99)
Glucose-Capillary: 117 mg/dL — ABNORMAL HIGH (ref 70–99)

## 2020-06-19 LAB — CBC
HCT: 33.4 % — ABNORMAL LOW (ref 36.0–46.0)
Hemoglobin: 11.1 g/dL — ABNORMAL LOW (ref 12.0–15.0)
MCH: 27.5 pg (ref 26.0–34.0)
MCHC: 33.2 g/dL (ref 30.0–36.0)
MCV: 82.9 fL (ref 80.0–100.0)
Platelets: 265 10*3/uL (ref 150–400)
RBC: 4.03 MIL/uL (ref 3.87–5.11)
RDW: 13.5 % (ref 11.5–15.5)
WBC: 8.1 10*3/uL (ref 4.0–10.5)
nRBC: 0 % (ref 0.0–0.2)

## 2020-06-19 MED ORDER — METFORMIN HCL 500 MG PO TABS: 500.0000 mg | ORAL_TABLET | Freq: Two times a day (BID) | ORAL | 0 refills | Status: DC

## 2020-06-19 MED ORDER — CLOPIDOGREL BISULFATE 75 MG PO TABS: 75.0000 mg | ORAL_TABLET | Freq: Every day | ORAL | 0 refills | Status: AC

## 2020-06-19 MED ORDER — ATORVASTATIN CALCIUM 80 MG PO TABS: 80.0000 mg | ORAL_TABLET | Freq: Every day | ORAL | 0 refills | Status: DC

## 2020-06-19 MED ORDER — FLUOXETINE HCL 10 MG PO CAPS: 10.0000 mg | ORAL_CAPSULE | Freq: Every day | ORAL | 0 refills | Status: DC

## 2020-06-19 MED ORDER — ASPIRIN 81 MG PO TBEC: 81.0000 mg | DELAYED_RELEASE_TABLET | Freq: Every day | ORAL | 0 refills | Status: AC

## 2020-06-19 NOTE — Discharge Summary (Addendum)
Physician Discharge Summary  Kristy Jensen WJX:[id number] DOB: 06-12-1950 DOA: 06/13/2020  PCP: Patient, No Pcp Per  Admit date: 06/13/2020 Discharge date: 06/19/2020 30 Day Unplanned Readmission Risk Score   Flowsheet Row ED to Hosp-Admission (Current) from 06/13/2020 in [facility] 4E CV SURGICAL PROGRESSIVE CARE  30 Day Unplanned Readmission Risk Score (%) 10.58 Filed at 06/19/2020 0400     This score is the patient's risk of an unplanned readmission within 30 days of being discharged (0 -100%). The score is based on dignosis, age, lab data, medications, orders, and past utilization.   Low:  0-14.9   Medium: 15-21.9   High: 22-29.9   Extreme: 30 and above         Admitted From: Home Disposition: SNF  Recommendations for Outpatient Follow-up:  1. Follow up with PCP in 1-2 weeks 2. Follow-up with cardiology in 1 month 3. Follow-up with neurology in 1 to 2 months 4. Please obtain BMP/CBC in one week 5. Please follow up with your PCP on the following pending results: Unresulted Labs (From admission, onward)          Start     Ordered   06/18/20 1442  Urine Culture  Once,   R        06/18/20 1441   06/17/20 1813  Vitamin B1  Add-on,   AD       Question:  Specimen collection method  Answer:  Lab=Lab collect   06/17/20 1813            Home Health: None Equipment/Devices: None  Discharge Condition: Stable CODE STATUS: Full code Diet recommendation: Cardiac  Subjective: Seen and examined this morning.  No complaints.  Alert and oriented x2 which is her baseline.  Brief/Interim Summary: Kristy Croninis a 70 y.o.f with PMH/o CVA but otherwise no significant other known history who lives independently, brought in secondary to altered mental status. She apparently moved from [place] recently. He moved her here because of increasing confusion.   Reportedly patient had a stroke in 2019.  Patient had fallen couple of times within a week before admission.  Upon arrival to ED, she  was hemodynamically stable.  She was diagnosed with UTI.  She also had altered mental status. Head CT without contrast is negative. COVID-19 screen is negative. Troponin is 21 and flat. CT C-spine is also negative. Chest x-ray and hip x-rays are at this point negative.   Patient was admitted with acute metabolic encephalopathy secondary to UTI.  She was treated with fosfomycin for that.  Patient's mental status improved somewhat but at baseline, she remains slightly confused.  MRI brain was ordered however reportedly patient had cardiac device which we were unsure if this was MRI compatible or not.  Eventually MRI brain was done which showed acute ischemic stroke.  Patient was supposed to take a statin and aspirin however she was not taking them.  There was some concern about possible brief atrial flutter and V. tach so cardiology was consulted.  There was no evidence of atrial flutter however cardiology started this patient on aspirin and atorvastatin and recommended 30-day monitor which cardiology is going to arrange as outpatient.  When patient was diagnosed with acute ischemic stroke, she was also added on Plavix.  Neurology was consulted.  Neurology recommended DAPT for 21 days and then stop aspirin but continue Plavix.  She will also continue statin.  She had stopped taking on her previous medications back in September according to the pharmacy research.  She was  on lisinopril, Maxide and several other medications.  Here, she was started on amlodipine for her blood pressure which then was controlled.  In the interim, patient was also complaining of left lower extremity pain for which lower extremity Doppler was done and she was ruled out of DVT and CT lower extremity left was also done which ruled out any fracture or any acute pathology.  Patient came in with thrombocytopenia which resolved as well.  Patient's hemoglobin A1c was 6.0.  Patient was evaluated by PT OT who recommended SNF.  Patient son was in  agreement as well.  Patient will be discharged in stable condition today.  I have started her on Metformin 500 mg p.o. twice daily and resuming her lisinopril.  Discharge Diagnoses:  Principal Problem:   Metabolic encephalopathy Active Problems:   CVA (cerebral vascular accident) (HCC)   Acute lower UTI   Hyperglycemia   Altered mental status    Discharge Instructions   Allergies as of 06/19/2020      Reactions   Penicillins Swelling, Other (See Comments)   Throat swelling/ turned red   Sulfa Antibiotics Swelling, Other (See Comments)   Throat swelling/turned red      Medication List    STOP taking these medications   acetaminophen 325 MG tablet Commonly known as: TYLENOL   apixaban 5 MG Tabs tablet Commonly known as: ELIQUIS   aspirin 81 MG chewable tablet Replaced by: aspirin 81 MG EC tablet   cholestyramine light 4 g packet Commonly known as: PREVALITE   dapagliflozin propanediol 10 MG Tabs tablet Commonly known as: FARXIGA   dicyclomine 20 MG tablet Commonly known as: BENTYL   esomeprazole 20 MG capsule Commonly known as: NEXIUM   icosapent Ethyl 1 g capsule Commonly known as: VASCEPA   triamterene-hydrochlorothiazide 37.5-25 MG tablet Commonly known as: MAXZIDE-25     TAKE these medications   aspirin 81 MG EC tablet Take 1 tablet (81 mg total) by mouth daily for 20 days. Swallow whole. Replaces: aspirin 81 MG chewable tablet   atorvastatin 80 MG tablet Commonly known as: LIPITOR Take 1 tablet (80 mg total) by mouth daily at 6 PM.   clopidogrel 75 MG tablet Commonly known as: PLAVIX Take 1 tablet (75 mg total) by mouth daily.   FLUoxetine 10 MG capsule Commonly known as: PROZAC Take 1 capsule (10 mg total) by mouth daily.   fosinopril 40 MG tablet Commonly known as: MONOPRIL Take 40 mg by mouth daily.   metFORMIN 500 MG tablet Commonly known as: Glucophage Take 1 tablet (500 mg total) by mouth 2 (two) times daily with a meal.    metoprolol succinate 25 MG 24 hr tablet Commonly known as: TOPROL-XL Take 25 mg by mouth daily.       Follow-up Information    Meriam Sprague, MD Follow up on 08/01/2020.   Specialties: Cardiology, Radiology Why: at 8:40 AM   Contact information: [address] [place] [address] [phone]              Allergies  Allergen Reactions  . Penicillins Swelling and Other (See Comments)    Throat swelling/ turned red  . Sulfa Antibiotics Swelling and Other (See Comments)    Throat swelling/turned red    Consultations: Cardiology and neurology   Procedures/Studies: CT ANGIO HEAD W OR WO CONTRAST  Result Date: 06/17/2020 CLINICAL DATA:  Neuro deficit, stroke documented. EXAM: CT ANGIOGRAPHY HEAD AND NECK TECHNIQUE: Multidetector CT imaging of the head and  neck was performed using the standard protocol during bolus administration of intravenous contrast. Multiplanar CT image reconstructions and MIPs were obtained to evaluate the vascular anatomy. Carotid stenosis measurements (when applicable) are obtained utilizing NASCET criteria, using the distal internal carotid diameter as the denominator. CONTRAST:  81mL OMNIPAQUE IOHEXOL 350 MG/ML SOLN COMPARISON:  MRI of the brain June 17, 2020. FINDINGS: CT HEAD FINDINGS Brain: A few small hypodense foci are seen within the right caudate LN form no with, corresponding to acute infarct seen on prior CT. No other area of hypodensity to suggest new acute infarct. Area of encephalomalacia and gliosis in the left parieto-occipital region. Vascular: No hyperdense vessel. Calcified plaques in the bilateral carotid siphons. Skull: Normal. Negative for fracture or focal lesion. Sinuses: Imaged portions are clear. Orbits: No acute finding. Review of the MIP images confirms the above findings CTA NECK FINDINGS Aortic arch: Standard branching. Imaged portion shows no evidence of aneurysm or dissection. Calcified plaques in the  aortic arch and at the origin of the left subclavian artery without hemodynamically significant stenosis. Right carotid system: Mild atherosclerotic changes in the right carotid bifurcation. No evidence of dissection, stenosis (50% or greater) or occlusion. Left carotid system: Mild atherosclerotic changes in the left carotid bifurcation. No evidence of dissection, stenosis (50% or greater) or occlusion. Vertebral arteries: Codominant. Calcified atherosclerotic plaque at the origin of the right vertebral artery. No evidence of dissection, stenosis (50% or greater) or occlusion. Skeleton: Degenerative changes of the cervical spine. No aggressive bone lesion identified. Other neck: Enlarged right thyroid lobe with a hypodense 1.7 cm nodule. Upper chest: Negative. Review of the MIP images confirms the above findings CTA HEAD FINDINGS Anterior circulation: Heavily calcified plaques in the bilateral carotid siphons without hemodynamically significant stenosis. The bilateral MCA and ACA vascular trees are maintained. Posterior circulation: No significant stenosis, proximal occlusion, aneurysm, or vascular malformation. Venous sinuses: As permitted by contrast timing, patent. Anatomic variants: None. Review of the MIP images confirms the above findings IMPRESSION: 1. A few small hypodense foci are seen within the right caudate nucleus corresponding to acute infarct seen on prior CT. No other area of hypodensity to suggest new acute infarct. 2. Encephalomalacia and gliosis in the left parieto-occipital region. 3. Mild atherosclerotic changes in the carotid bifurcations and bilateral carotid siphons without hemodynamically significant stenosis. 4. Enlarged right thyroid lobe with a 1.7 cm nodule. 5. Aortic atherosclerosis. Aortic Atherosclerosis (ICD10-I70.0). Electronically Signed   By: Baldemar Lenis M.D.   On: 06/17/2020 15:47   DG Chest 2 View  Result Date: 06/13/2020 CLINICAL DATA:  Status post fall.  EXAM: CHEST - 2 VIEW COMPARISON:  None. FINDINGS: The heart size and mediastinal contours are within normal limits. No pneumothorax or pleural effusion is noted. Mild bibasilar subsegmental atelectasis or scarring is noted. The visualized skeletal structures are unremarkable. IMPRESSION: Mild bibasilar subsegmental atelectasis or scarring. Electronically Signed   By: Lupita Raider M.D.   On: 06/13/2020 16:42   CT Head Wo Contrast  Result Date: 06/13/2020 CLINICAL DATA:  Head trauma EXAM: CT HEAD WITHOUT CONTRAST CT CERVICAL SPINE WITHOUT CONTRAST TECHNIQUE: Multidetector CT imaging of the head and cervical spine was performed following the standard protocol without intravenous contrast. Multiplanar CT image reconstructions of the cervical spine were also generated. COMPARISON:  None. FINDINGS: CT HEAD FINDINGS Brain: Ventricle size normal. Mild atrophy. Chronic infarct left parietal lobe with encephalomalacia and mild calcification. Negative for acute infarct, hemorrhage, mass. Vascular: Negative for hyperdense vessel Skull:  Negative Sinuses/Orbits: Paranasal sinuses clear.  Negative orbit Other: CT CERVICAL SPINE FINDINGS Alignment: Slight anterolisthesis C4-5.  Mild cervical kyphosis. Skull base and vertebrae: Negative for fracture. Soft tissues and spinal canal: Hypoplastic left thyroid. 2 cm nodule right thyroid. No enlarged lymph nodes in the neck Disc levels: Disc degeneration and spurring C4 through C7. This is most severe at C5-6 with diffuse uncinate spurring as well as a calcified extruded disc fragment extending caudally. There is moderate spinal stenosis. Severe right foraminal encroachment and moderate left foraminal encroachment at C5-6. Upper chest: Lung apices clear bilaterally Other: None IMPRESSION: 1. No acute intracranial abnormality 2. Negative for cervical spine fracture 3. Cervical spine degenerative change with moderate spinal stenosis at C5-6. Electronically Signed   By: Marlan Palau  M.D.   On: 06/13/2020 16:01   CT ANGIO NECK W OR WO CONTRAST  Result Date: 06/17/2020 CLINICAL DATA:  Neuro deficit, stroke documented. EXAM: CT ANGIOGRAPHY HEAD AND NECK TECHNIQUE: Multidetector CT imaging of the head and neck was performed using the standard protocol during bolus administration of intravenous contrast. Multiplanar CT image reconstructions and MIPs were obtained to evaluate the vascular anatomy. Carotid stenosis measurements (when applicable) are obtained utilizing NASCET criteria, using the distal internal carotid diameter as the denominator. CONTRAST:  50mL OMNIPAQUE IOHEXOL 350 MG/ML SOLN COMPARISON:  MRI of the brain June 17, 2020. FINDINGS: CT HEAD FINDINGS Brain: A few small hypodense foci are seen within the right caudate LN form no with, corresponding to acute infarct seen on prior CT. No other area of hypodensity to suggest new acute infarct. Area of encephalomalacia and gliosis in the left parieto-occipital region. Vascular: No hyperdense vessel. Calcified plaques in the bilateral carotid siphons. Skull: Normal. Negative for fracture or focal lesion. Sinuses: Imaged portions are clear. Orbits: No acute finding. Review of the MIP images confirms the above findings CTA NECK FINDINGS Aortic arch: Standard branching. Imaged portion shows no evidence of aneurysm or dissection. Calcified plaques in the aortic arch and at the origin of the left subclavian artery without hemodynamically significant stenosis. Right carotid system: Mild atherosclerotic changes in the right carotid bifurcation. No evidence of dissection, stenosis (50% or greater) or occlusion. Left carotid system: Mild atherosclerotic changes in the left carotid bifurcation. No evidence of dissection, stenosis (50% or greater) or occlusion. Vertebral arteries: Codominant. Calcified atherosclerotic plaque at the origin of the right vertebral artery. No evidence of dissection, stenosis (50% or greater) or occlusion. Skeleton:  Degenerative changes of the cervical spine. No aggressive bone lesion identified. Other neck: Enlarged right thyroid lobe with a hypodense 1.7 cm nodule. Upper chest: Negative. Review of the MIP images confirms the above findings CTA HEAD FINDINGS Anterior circulation: Heavily calcified plaques in the bilateral carotid siphons without hemodynamically significant stenosis. The bilateral MCA and ACA vascular trees are maintained. Posterior circulation: No significant stenosis, proximal occlusion, aneurysm, or vascular malformation. Venous sinuses: As permitted by contrast timing, patent. Anatomic variants: None. Review of the MIP images confirms the above findings IMPRESSION: 1. A few small hypodense foci are seen within the right caudate nucleus corresponding to acute infarct seen on prior CT. No other area of hypodensity to suggest new acute infarct. 2. Encephalomalacia and gliosis in the left parieto-occipital region. 3. Mild atherosclerotic changes in the carotid bifurcations and bilateral carotid siphons without hemodynamically significant stenosis. 4. Enlarged right thyroid lobe with a 1.7 cm nodule. 5. Aortic atherosclerosis. Aortic Atherosclerosis (ICD10-I70.0). Electronically Signed   By: Blenda Nicely.D.  On: 06/17/2020 15:47   CT Cervical Spine Wo Contrast  Result Date: 06/13/2020 CLINICAL DATA:  Head trauma EXAM: CT HEAD WITHOUT CONTRAST CT CERVICAL SPINE WITHOUT CONTRAST TECHNIQUE: Multidetector CT imaging of the head and cervical spine was performed following the standard protocol without intravenous contrast. Multiplanar CT image reconstructions of the cervical spine were also generated. COMPARISON:  None. FINDINGS: CT HEAD FINDINGS Brain: Ventricle size normal. Mild atrophy. Chronic infarct left parietal lobe with encephalomalacia and mild calcification. Negative for acute infarct, hemorrhage, mass. Vascular: Negative for hyperdense vessel Skull: Negative Sinuses/Orbits:  Paranasal sinuses clear.  Negative orbit Other: CT CERVICAL SPINE FINDINGS Alignment: Slight anterolisthesis C4-5.  Mild cervical kyphosis. Skull base and vertebrae: Negative for fracture. Soft tissues and spinal canal: Hypoplastic left thyroid. 2 cm nodule right thyroid. No enlarged lymph nodes in the neck Disc levels: Disc degeneration and spurring C4 through C7. This is most severe at C5-6 with diffuse uncinate spurring as well as a calcified extruded disc fragment extending caudally. There is moderate spinal stenosis. Severe right foraminal encroachment and moderate left foraminal encroachment at C5-6. Upper chest: Lung apices clear bilaterally Other: None IMPRESSION: 1. No acute intracranial abnormality 2. Negative for cervical spine fracture 3. Cervical spine degenerative change with moderate spinal stenosis at C5-6. Electronically Signed   By: Marlan Palau M.D.   On: 06/13/2020 16:01   MR BRAIN WO CONTRAST  Result Date: 06/17/2020 CLINICAL DATA:  Stroke suspected. EXAM: MRI HEAD WITHOUT CONTRAST TECHNIQUE: Multiplanar, multiecho pulse sequences of the brain and surrounding structures were obtained without intravenous contrast. COMPARISON:  CT head June 13, 2020. FINDINGS: Brain: Multiple small infarcts involving the right basal ganglia, including the right caudate, anterior limb right internal capsule, and anterior putamen. Mild associated edema without mass effect. No midline shift. Large remote infarct involving the left parietal lobe with encephalomalacia, gliosis, and areas of internal mineralization, as seen on recent CT head. No mass lesion. No extra-axial fluid collection. No hydrocephalus. No evidence of acute hemorrhage. Generalized cerebral atrophy with ex vacuo ventricular dilation additional mild scattered T2/FLAIR white matter hyperintensities, likely related to chronic microvascular ischemic disease. Vascular: Major arterial flow voids at the skull base are maintained; however, the  right paraclinoid ICA flow void is poorly visualized (series 6, image 10). Skull and upper cervical spine: Normal marrow signal. Sinuses/Orbits: Mild remote right medial orbital wall fracture/deformity. Orbits otherwise unremarkable. Sinuses are clear. Other: No mastoid effusions. IMPRESSION: 1. Multiple small infarcts involving the right basal ganglia, as detailed above. 2. The right paraclinoid ICA flow void is poorly visualized. While this could potentially relate to slice selection, given the above findings recommend CTA of the head to evaluate for underlying stenosis/occlusion. These results will be called to the ordering clinician or representative by the Radiologist Assistant, and communication documented in the PACS or [employer]. Electronically Signed   By: Feliberto Harts MD   On: 06/17/2020 11:07   CT HIP LEFT WO CONTRAST  Result Date: 06/14/2020 CLINICAL DATA:  Chronic hip pain on. EXAM: CT OF THE LEFT HIP WITHOUT CONTRAST TECHNIQUE: Multidetector CT imaging of the left hip was performed according to the standard protocol. Multiplanar CT image reconstructions were also generated. COMPARISON:  None. 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 There are degenerative changes of the left hip without evidence for an acute displaced fracture or dislocation. There are advanced degenerative changes of the lower lumbar spine. Ligaments Suboptimally assessed by CT.  Muscles and Tendons No hematoma. Soft tissues The visualized urinary bladder is unremarkable. There is scattered sigmoid diverticula without CT evidence for diverticulitis. There are atherosclerotic changes of the visualized abdominal aorta. IMPRESSION: 1. 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 2. Degenerative changes of the left hip without evidence for an acute displaced fracture or dislocation. 3. Advanced degenerative changes of  the lower lumbar spine. Aortic Atherosclerosis (ICD10-I70.0). Electronically Signed   By: Katherine Mantle M.D.   On: 06/14/2020 20:55   DG Knee Complete 4 Views Left  Result Date: 06/13/2020 CLINICAL DATA:  Left knee pain after fall several days ago. EXAM: LEFT KNEE - COMPLETE 4+ VIEW COMPARISON:  None. FINDINGS: No evidence of fracture, dislocation, or joint effusion. Mild narrowing of medial and lateral joint spaces are noted with osteophyte formation. Severe narrowing of patellofemoral space is noted. Soft tissues are unremarkable. IMPRESSION: Tricompartmental degenerative joint disease. No acute abnormality seen in the left knee. Electronically Signed   By: Lupita Raider M.D.   On: 06/13/2020 16:43   ECHOCARDIOGRAM COMPLETE  Result Date: 06/15/2020    ECHOCARDIOGRAM REPORT   Patient Name:   Kristy Jensen Date of Exam: 06/15/2020 Medical Rec #:  [id number]       Height: Accession #:    [id number]      Weight:       240.5 lb Date of Birth:  Oct 11, 1950       BSA:          2.284 m Patient Age:    69 years        BP:           146/61 mmHg Patient Gender: F               HR:           57 bpm. Exam Location:  Inpatient Procedure: 2D Echo, Color Doppler and Cardiac Doppler Indications:    I48.92* Unspecified atrial flutter  History:        Patient has no prior history of Echocardiogram examinations.  Sonographer:    Irving Burton Senior RDCS Referring Phys: [phone] SEYED A SHAHMEHDI IMPRESSIONS  1. Left ventricular ejection fraction, by estimation, is 55 to 60%. The left ventricle has normal function. The left ventricle has no regional wall motion abnormalities. There is mild concentric left ventricular hypertrophy. Left ventricular diastolic parameters are consistent with Grade I diastolic dysfunction (impaired relaxation).  2. Right ventricular systolic function is low normal. The right ventricular size is normal. There is normal pulmonary artery systolic pressure.  3. The mitral valve is grossly normal. No  evidence of mitral valve regurgitation.  4. The aortic valve is tricuspid. There is mild thickening of the aortic valve. Aortic valve regurgitation is not visualized. No aortic stenosis is present. Comparison(s): No prior Echocardiogram. FINDINGS  Left Ventricle: Left ventricular ejection fraction, by estimation, is 55 to 60%. The left ventricle has normal function. The left ventricle has no regional wall motion abnormalities. The left ventricular internal cavity size was normal in size. There is  mild concentric left ventricular hypertrophy. Left ventricular diastolic parameters are consistent with Grade I diastolic dysfunction (impaired relaxation). Right Ventricle: The right ventricular size is normal. Right vetricular wall thickness was not well visualized. Right ventricular systolic function is low normal. There is normal pulmonary artery systolic pressure. The tricuspid regurgitant velocity is 2.81 m/s, and with an assumed right atrial pressure of 3 mmHg, the estimated right ventricular systolic pressure is  34.6 mmHg. Left Atrium: Left atrial size was normal in size. Right Atrium: Right atrial size was normal in size. Pericardium: There is no evidence of pericardial effusion. Mitral Valve: The mitral valve is grossly normal. Mild mitral annular calcification. No evidence of mitral valve regurgitation. Tricuspid Valve: The tricuspid valve is grossly normal. Tricuspid valve regurgitation is not demonstrated. Aortic Valve: The aortic valve is tricuspid. There is mild thickening of the aortic valve. Aortic valve regurgitation is not visualized. No aortic stenosis is present. Pulmonic Valve: The pulmonic valve was grossly normal. Pulmonic valve regurgitation is trivial. No evidence of pulmonic stenosis. Aorta: The aortic root and ascending aorta are structurally normal, with no evidence of dilitation. IAS/Shunts: The atrial septum is grossly normal.  LEFT VENTRICLE PLAX 2D LVIDd:         4.70 cm  Diastology LVIDs:          2.80 cm  LV e' medial:    5.22 cm/s LV PW:         1.40 cm  LV E/e' medial:  16.2 LV IVS:        1.10 cm  LV e' lateral:   7.29 cm/s LVOT diam:     2.10 cm  LV E/e' lateral: 11.6 LV SV:         78 LV SV Index:   34 LVOT Area:     3.46 cm  RIGHT VENTRICLE RV S prime:     9.79 cm/s TAPSE (M-mode): 2.0 cm LEFT ATRIUM           Index       RIGHT ATRIUM           Index LA diam:      4.20 cm 1.84 cm/m  RA Area:     16.50 cm LA Vol (A2C): 55.1 ml 24.13 ml/m RA Volume:   41.10 ml  18.00 ml/m LA Vol (A4C): 67.7 ml 29.65 ml/m  AORTIC VALVE LVOT Vmax:   104.00 cm/s LVOT Vmean:  75.600 cm/s LVOT VTI:    0.226 m  AORTA Ao Root diam: 3.40 cm Ao Asc diam:  3.60 cm MITRAL VALVE               TRICUSPID VALVE MV Area (PHT): 2.83 cm    TR Peak grad:   31.6 mmHg MV Decel Time: 268 msec    TR Vmax:        281.00 cm/s MV E velocity: 84.80 cm/s MV A velocity: 90.40 cm/s  SHUNTS MV E/A ratio:  0.94        Systemic VTI:  0.23 m                            Systemic Diam: 2.10 cm Riley LamMahesh Chandrasekhar MD Electronically signed by Riley LamMahesh Chandrasekhar MD Signature Date/Time: 06/15/2020/1:16:06 PM    Final    DG Hip Unilat W or Wo Pelvis 2-3 Views Left  Result Date: 06/13/2020 CLINICAL DATA:  Acute left hip pain after fall several days ago. EXAM: DG HIP (WITH OR WITHOUT PELVIS) 2-3V LEFT COMPARISON:  None. FINDINGS: There is no evidence of hip fracture or dislocation. There is no evidence of arthropathy or other focal bone abnormality. IMPRESSION: Negative. Electronically Signed   By: Lupita RaiderJames  Green Jr M.D.   On: 06/13/2020 16:44   VAS US LOWER EXTREMITY VENOUS (DVT)  Result Date: 06/14/2020  Lower Venous DVT Study Indications: Swelling.  Limitations: Patient's inability to move left leg.  Comparison Study: No prior study Performing Technologist: Sherren Kerns RVS  Examination Guidelines: A complete evaluation includes B-mode imaging, spectral Doppler, color Doppler, and power Doppler as needed of all accessible portions of each  vessel. Bilateral testing is considered an integral part of a complete examination. Limited examinations for reoccurring indications may be performed as noted. The reflux portion of the exam is performed with the patient in reverse Trendelenburg.  Right Technical Findings: Right leg not evaluated.  +---------+---------------+---------+-----------+----------+-------------------+ LEFT     CompressibilityPhasicitySpontaneityPropertiesThrombus Aging      +---------+---------------+---------+-----------+----------+-------------------+ CFV      Full           Yes      Yes                                      +---------+---------------+---------+-----------+----------+-------------------+ SFJ      Full                                                             +---------+---------------+---------+-----------+----------+-------------------+ FV Prox  Full                                                             +---------+---------------+---------+-----------+----------+-------------------+ FV Mid   Full                                                             +---------+---------------+---------+-----------+----------+-------------------+ FV DistalFull                                                             +---------+---------------+---------+-----------+----------+-------------------+ PFV      Full                                                             +---------+---------------+---------+-----------+----------+-------------------+ POP                     Yes      Yes                  patent by color and                                                       Doppler             +---------+---------------+---------+-----------+----------+-------------------+  PTV      Full                                                             +---------+---------------+---------+-----------+----------+-------------------+ PERO     Full                                                              +---------+---------------+---------+-----------+----------+-------------------+     Summary: LEFT: - There is no evidence of deep vein thrombosis in the lower extremity. However, portions of this examination were limited- see technologist comments above.  *See table(s) above for measurements and observations. Electronically signed by Coral Else MD on 06/14/2020 at 7:58:01 PM.    Final      Discharge Exam: Vitals:   06/19/20 0819 06/19/20 0842  BP: (!) 153/70 (!) 153/93  Pulse:  80  Resp: (!) 46 17  Temp: 97.9 F (36.6 C) 98 F (36.7 C)  SpO2: 96% 95%   Vitals:   06/19/20 0004 06/19/20 0335 06/19/20 0819 06/19/20 0842  BP: (!) 151/69 (!) 152/64 (!) 153/70 (!) 153/93  Pulse: 76 75  80  Resp: 17 20 (!) 46 17  Temp: 97.9 F (36.6 C) 98.4 F (36.9 C) 97.9 F (36.6 C) 98 F (36.7 C)  TempSrc: Oral Oral Oral Oral  SpO2: 96% 93% 96% 95%  Weight:      Height:        General: Pt is alert, awake, not in acute distress Cardiovascular: RRR, S1/S2 +, no rubs, no gallops Respiratory: CTA bilaterally, no wheezing, no rhonchi Abdominal: Soft, NT, ND, bowel sounds + Extremities: no edema, no cyanosis, weakness in the left lower extremity.    The results of significant diagnostics from this hospitalization (including imaging, microbiology, ancillary and laboratory) are listed below for reference.     Microbiology: Recent Results (from the past 240 hour(s))  Culture, blood (routine x 2)     Status: None   Collection Time: 06/13/20  6:52 PM   Specimen: BLOOD RIGHT FOREARM  Result Value Ref Range Status   Specimen Description BLOOD RIGHT FOREARM  Final   Special Requests   Final    BOTTLES DRAWN AEROBIC AND ANAEROBIC Blood Culture results may not be optimal due to an inadequate volume of blood received in culture bottles   Culture   Final    NO GROWTH 5 DAYS Performed at [facility], [address], [place], [place]  [address]    Report Status 06/18/2020 FINAL  Final  Culture, blood (routine x 2)     Status: None   Collection Time: 06/13/20  7:10 PM   Specimen: BLOOD  Result Value Ref Range Status   Specimen Description BLOOD RIGHT ANTECUBITAL  Final   Special Requests   Final    BOTTLES DRAWN AEROBIC AND ANAEROBIC Blood Culture adequate volume   Culture   Final    NO GROWTH 5 DAYS Performed at [facility], [address] [place], [place] [address]    Report Status 06/18/2020 FINAL  Final  Resp Panel by RT-PCR (Flu A&B, Covid)  Nasopharyngeal Swab     Status: None   Collection Time: 06/13/20  8:20 PM   Specimen: Nasopharyngeal Swab; Nasopharyngeal(NP) swabs in vial transport medium  Result Value Ref Range Status   SARS Coronavirus 2 by RT PCR NEGATIVE NEGATIVE Final    Comment: (NOTE) SARS-CoV-2 target nucleic acids are NOT DETECTED.  The SARS-CoV-2 RNA is generally detectable in upper respiratory specimens during the acute phase of infection. The lowest concentration of SARS-CoV-2 viral copies this assay can detect is 138 copies/mL. A negative result does not preclude SARS-Cov-2 infection and should not be used as the sole basis for treatment or other patient management decisions. A negative result may occur with  improper specimen collection/handling, submission of specimen other than nasopharyngeal swab, presence of viral mutation(s) within the areas targeted by this assay, and inadequate number of viral copies(<138 copies/mL). A negative result must be combined with clinical observations, patient history, and epidemiological information. The expected result is Negative.  Fact Sheet for Patients:  BloggerCourse.com  Fact Sheet for Healthcare Providers:  SeriousBroker.it  This test is no t yet approved or cleared by the [place] FDA and  has been authorized for detection and/or diagnosis of SARS-CoV-2 by FDA under an Emergency  Use Authorization (EUA). This EUA will remain  in effect (meaning this test can be used) for the duration of the COVID-19 declaration under Section 564(b)(1) of the Act, 21 U.S.C.section 360bbb-3(b)(1), unless the authorization is terminated  or revoked sooner.       Influenza A by PCR NEGATIVE NEGATIVE Final   Influenza B by PCR NEGATIVE NEGATIVE Final    Comment: (NOTE) The Xpert Xpress SARS-CoV-2/FLU/RSV plus assay is intended as an aid in the diagnosis of influenza from Nasopharyngeal swab specimens and should not be used as a sole basis for treatment. Nasal washings and aspirates are unacceptable for Xpert Xpress SARS-CoV-2/FLU/RSV testing.  Fact Sheet for Patients: BloggerCourse.com  Fact Sheet for Healthcare Providers: SeriousBroker.it  This test is not yet approved or cleared by the [place] FDA and has been authorized for detection and/or diagnosis of SARS-CoV-2 by FDA under an Emergency Use Authorization (EUA). This EUA will remain in effect (meaning this test can be used) for the duration of the COVID-19 declaration under Section 564(b)(1) of the Act, 21 U.S.C. section 360bbb-3(b)(1), unless the authorization is terminated or revoked.  Performed at [facility], [address] [place], [place] [address]   SARS CORONAVIRUS 2 (TAT 6-24 HRS) Nasopharyngeal Nasopharyngeal Swab     Status: None   Collection Time: 06/18/20  2:49 PM   Specimen: Nasopharyngeal Swab  Result Value Ref Range Status   SARS Coronavirus 2 NEGATIVE NEGATIVE Final    Comment: (NOTE) SARS-CoV-2 target nucleic acids are NOT DETECTED.  The SARS-CoV-2 RNA is generally detectable in upper and lower respiratory specimens during the acute phase of infection. Negative results do not preclude SARS-CoV-2 infection, do not rule out co-infections with other pathogens, and should not be used as the sole basis for treatment or other patient  management decisions. Negative results must be combined with clinical observations, patient history, and epidemiological information. The expected result is Negative.  Fact Sheet for Patients: HairSlick.no  Fact Sheet for Healthcare Providers: quierodirigir.com  This test is not yet approved or cleared by the [place] FDA and  has been authorized for detection and/or diagnosis of SARS-CoV-2 by FDA under an Emergency Use Authorization (EUA). This EUA will remain  in effect (meaning this test can be  used) for the duration of the COVID-19 declaration under Se ction 564(b)(1) of the Act, 21 U.S.C. section 360bbb-3(b)(1), unless the authorization is terminated or revoked sooner.  Performed at [facility], [address], [place], [place] [address]      Labs: BNP (last 3 results) No results for input(s): BNP in the last 8760 hours. Basic Metabolic Panel: Recent Labs  Lab 06/15/20 0604 06/16/20 0116 06/17/20 0104 06/18/20 0112 06/19/20 0110  NA 137 137 136 137 136  K 4.0 3.9 3.9 4.0 4.0  CL 107 107 106 105 104  CO2 20* 20* 23 22 24   GLUCOSE 116* 108* 114* 118* 118*  BUN 14 16 15 15 18   CREATININE 0.92 0.94 0.86 0.86 0.87  CALCIUM 8.7* 8.6* 8.8* 9.1 9.0   Liver Function Tests: Recent Labs  Lab 06/13/20 1431 06/14/20 0337  AST 24 15  ALT 15 15  ALKPHOS 49 46  BILITOT 1.2 1.0  PROT 6.7 6.4*  ALBUMIN 3.4* 3.1*   No results for input(s): LIPASE, AMYLASE in the last 168 hours. Recent Labs  Lab 06/13/20 1432  AMMONIA 40*   CBC: Recent Labs  Lab 06/13/20 1431 06/13/20 1506 06/15/20 0604 06/16/20 0116 06/17/20 0104 06/18/20 0112 06/19/20 0110  WBC 11.8*   < > 10.1 9.0 8.4 8.8 8.1  NEUTROABS 10.4*  --   --   --   --   --   --   HGB 12.0   < > 11.3* 10.8* 11.5* 11.1* 11.1*  HCT 38.5   < > 36.7 32.0* 36.2 32.9* 33.4*  MCV 88.7   < > 87.4 83.6 84.2 83.1 82.9  PLT 49*   < > 250 251 288 272 265    < > = values in this interval not displayed.   Cardiac Enzymes: No results for input(s): CKTOTAL, CKMB, CKMBINDEX, TROPONINI in the last 168 hours. BNP: Invalid input(s): POCBNP CBG: Recent Labs  Lab 06/18/20 0617 06/18/20 1129 06/18/20 1742 06/18/20 2135 06/19/20 0614  GLUCAP 124* 191* 108* 128* 122*   D-Dimer No results for input(s): DDIMER in the last 72 hours. Hgb A1c No results for input(s): HGBA1C in the last 72 hours. Lipid Profile Recent Labs    06/17/20 0104  CHOL 162  HDL 33*  LDLCALC 105*  TRIG 118  CHOLHDL 4.9   Thyroid function studies No results for input(s): TSH, T4TOTAL, T3FREE, THYROIDAB in the last 72 hours.  Invalid input(s): FREET3 Anemia work up No results for input(s): VITAMINB12, FOLATE, FERRITIN, TIBC, IRON, RETICCTPCT in the last 72 hours. Urinalysis    Component Value Date/Time   COLORURINE YELLOW 06/13/2020 1618   APPEARANCEUR CLOUDY (A) 06/13/2020 1618   LABSPEC 1.017 06/13/2020 1618   PHURINE 5.0 06/13/2020 1618   GLUCOSEU NEGATIVE 06/13/2020 1618   HGBUR SMALL (A) 06/13/2020 1618   BILIRUBINUR NEGATIVE 06/13/2020 1618   KETONESUR 5 (A) 06/13/2020 1618   PROTEINUR 100 (A) 06/13/2020 1618   NITRITE NEGATIVE 06/13/2020 1618   LEUKOCYTESUR LARGE (A) 06/13/2020 1618   Sepsis Labs Invalid input(s): PROCALCITONIN,  WBC,  LACTICIDVEN Microbiology Recent Results (from the past 240 hour(s))  Culture, blood (routine x 2)     Status: None   Collection Time: 06/13/20  6:52 PM   Specimen: BLOOD RIGHT FOREARM  Result Value Ref Range Status   Specimen Description BLOOD RIGHT FOREARM  Final   Special Requests   Final    BOTTLES DRAWN AEROBIC AND ANAEROBIC Blood Culture results may not be optimal due  to an inadequate volume of blood received in culture bottles   Culture   Final    NO GROWTH 5 DAYS Performed at [facility], [address], [place], [place] [address]    Report Status 06/18/2020 FINAL  Final  Culture, blood (routine x  2)     Status: None   Collection Time: 06/13/20  7:10 PM   Specimen: BLOOD  Result Value Ref Range Status   Specimen Description BLOOD RIGHT ANTECUBITAL  Final   Special Requests   Final    BOTTLES DRAWN AEROBIC AND ANAEROBIC Blood Culture adequate volume   Culture   Final    NO GROWTH 5 DAYS Performed at [facility], [address] [place], [place] [address]    Report Status 06/18/2020 FINAL  Final  Resp Panel by RT-PCR (Flu A&B, Covid) Nasopharyngeal Swab     Status: None   Collection Time: 06/13/20  8:20 PM   Specimen: Nasopharyngeal Swab; Nasopharyngeal(NP) swabs in vial transport medium  Result Value Ref Range Status   SARS Coronavirus 2 by RT PCR NEGATIVE NEGATIVE Final    Comment: (NOTE) SARS-CoV-2 target nucleic acids are NOT DETECTED.  The SARS-CoV-2 RNA is generally detectable in upper respiratory specimens during the acute phase of infection. The lowest concentration of SARS-CoV-2 viral copies this assay can detect is 138 copies/mL. A negative result does not preclude SARS-Cov-2 infection and should not be used as the sole basis for treatment or other patient management decisions. A negative result may occur with  improper specimen collection/handling, submission of specimen other than nasopharyngeal swab, presence of viral mutation(s) within the areas targeted by this assay, and inadequate number of viral copies(<138 copies/mL). A negative result must be combined with clinical observations, patient history, and epidemiological information. The expected result is Negative.  Fact Sheet for Patients:  BloggerCourse.com  Fact Sheet for Healthcare Providers:  SeriousBroker.it  This test is no t yet approved or cleared by the [place] FDA and  has been authorized for detection and/or diagnosis of SARS-CoV-2 by FDA under an Emergency Use Authorization (EUA). This EUA will remain  in effect (meaning this test  can be used) for the duration of the COVID-19 declaration under Section 564(b)(1) of the Act, 21 U.S.C.section 360bbb-3(b)(1), unless the authorization is terminated  or revoked sooner.       Influenza A by PCR NEGATIVE NEGATIVE Final   Influenza B by PCR NEGATIVE NEGATIVE Final    Comment: (NOTE) The Xpert Xpress SARS-CoV-2/FLU/RSV plus assay is intended as an aid in the diagnosis of influenza from Nasopharyngeal swab specimens and should not be used as a sole basis for treatment. Nasal washings and aspirates are unacceptable for Xpert Xpress SARS-CoV-2/FLU/RSV testing.  Fact Sheet for Patients: BloggerCourse.com  Fact Sheet for Healthcare Providers: SeriousBroker.it  This test is not yet approved or cleared by the [place] FDA and has been authorized for detection and/or diagnosis of SARS-CoV-2 by FDA under an Emergency Use Authorization (EUA). This EUA will remain in effect (meaning this test can be used) for the duration of the COVID-19 declaration under Section 564(b)(1) of the Act, 21 U.S.C. section 360bbb-3(b)(1), unless the authorization is terminated or revoked.  Performed at [facility], [address], [place], [place] [address]   SARS CORONAVIRUS 2 (TAT 6-24 HRS) Nasopharyngeal Nasopharyngeal Swab     Status: None   Collection Time: 06/18/20  2:49 PM   Specimen: Nasopharyngeal Swab  Result Value Ref Range Status  SARS Coronavirus 2 NEGATIVE NEGATIVE Final    Comment: (NOTE) SARS-CoV-2 target nucleic acids are NOT DETECTED.  The SARS-CoV-2 RNA is generally detectable in upper and lower respiratory specimens during the acute phase of infection. Negative results do not preclude SARS-CoV-2 infection, do not rule out co-infections with other pathogens, and should not be used as the sole basis for treatment or other patient management decisions. Negative results must be combined with clinical  observations, patient history, and epidemiological information. The expected result is Negative.  Fact Sheet for Patients: HairSlick.no  Fact Sheet for Healthcare Providers: quierodirigir.com  This test is not yet approved or cleared by the [place] FDA and  has been authorized for detection and/or diagnosis of SARS-CoV-2 by FDA under an Emergency Use Authorization (EUA). This EUA will remain  in effect (meaning this test can be used) for the duration of the COVID-19 declaration under Se ction 564(b)(1) of the Act, 21 U.S.C. section 360bbb-3(b)(1), unless the authorization is terminated or revoked sooner.  Performed at [facility], [address], [place], [place] [address]      Time coordinating discharge: Over 30 minutes  SIGNED:   Hughie Closs, MD  [facility] 06/19/2020, 10:00 AM  If 7PM-7AM, please contact night-coverage [email or web address]

## 2020-06-19 NOTE — Progress Notes (Signed)
Called report to [employer], RN in [place].   Lawson Radar, RN

## 2020-06-19 NOTE — Progress Notes (Signed)
STROKE TEAM PROGRESS NOTE   INTERVAL HISTORY Patient is sitting up in bed.  She remains slightly confused and disoriented but has no focal deficits.  Vital signs are stable.  Plans are to transfer to skilled nursing facility today if bed available  Vitals:   06/19/20 0335 06/19/20 0819 06/19/20 0842 06/19/20 1230  BP: (!) 152/64 (!) 153/70 (!) 153/93 (!) 140/58  Pulse: 75  80   Resp: 20 (!) 46 17 20  Temp: 98.4 F (36.9 C) 97.9 F (36.6 C) 98 F (36.7 C) 98.7 F (37.1 C)  TempSrc: Oral Oral Oral Oral  SpO2: 93% 96% 95% 97%  Weight:      Height:       CBC:  Recent Labs  Lab 06/13/20 1431 06/13/20 1506 06/18/20 0112 06/19/20 0110  WBC 11.8*   < > 8.8 8.1  NEUTROABS 10.4*  --   --   --   HGB 12.0   < > 11.1* 11.1*  HCT 38.5   < > 32.9* 33.4*  MCV 88.7   < > 83.1 82.9  PLT 49*   < > 272 265   < > = values in this interval not displayed.   Basic Metabolic Panel:  Recent Labs  Lab 06/18/20 0112 06/19/20 0110  NA 137 136  K 4.0 4.0  CL 105 104  CO2 22 24  GLUCOSE 118* 118*  BUN 15 18  CREATININE 0.86 0.87  CALCIUM 9.1 9.0    Lipid Panel:  Recent Labs  Lab 06/17/20 0104  CHOL 162  TRIG 118  HDL 33*  CHOLHDL 4.9  VLDL 24  LDLCALC 932*    HgbA1c:  Recent Labs  Lab 06/14/20 0337  HGBA1C 6.0*   Urine Drug Screen:  Recent Labs  Lab 06/13/20 1618  LABOPIA NONE DETECTED  COCAINSCRNUR NONE DETECTED  LABBENZ NONE DETECTED  AMPHETMU NONE DETECTED  THCU NONE DETECTED  LABBARB NONE DETECTED    Alcohol Level  Recent Labs  Lab 06/13/20 1431  ETH <10    IMAGING past 24 hours No results found.  PHYSICAL EXAM Physical Exam  Constitutional: Appears well-developed and well-nourished.  Psych: Affect appropriate to situation Eyes: No scleral injection HENT: No OP obstrucion MSK: no joint deformities.  Cardiovascular: Normal rate and regular rhythm.  Respiratory: Effort normal, non-labored breathing GI: Soft.  No distension. There is no tenderness.   Skin: WDI  Neuro: Mental Status: Patient is awake, alert to person, place, and time.  Confused to situation.  Poor insight into her condition. She has difficulty with complex commands, especially two-step or three-step commands Cranial Nerves: II: Visual Fields are notable for  partial homonymous hemianopsia with mpairment in the right lower > upper field in both eyes. Pupils are equal, round, and reactive to light. She has a tendency to close the right eye when talking. III,IV, VI: EOMI without ptosis, diploplia worse with gaze to the left, present on and off since her stroke in 2019.  V: Facial sensation is symmetric to temperature VII: Facial movement is symmetric.  VIII: hearing is intact to voice X: Uvula elevates symmetrically XI: Shoulder shrug is symmetric. XII: tongue is midline without atrophy or fasciculations.  Motor: Tone is normal. Bulk is normal. 5/5 strength in upper extremities. 3/5 strength in lower extremities There is pronation and unilateral tremor on the left, but no downward drift.  Sensory: Sensation is symmetric to light touch and temperature in the arms and legs.  Plantars: Toes are downgoing bilaterally. Cerebellar: FNF  and HKS are intact bilaterally.   ASSESSMENT/PLAN [name] Jensen is a 70 y.o. female with history of HLD, stroke (2019), prediabetes presenting with visual field defect, diplopia, and confusion.    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    CTA head & neck:  few small hypodense foci are seen within the right caudate nucleus corresponding to acute infarct   MRI: Multiple small infarcts involving the right basal ganglia  2D Echo: EF: 55-60%, grade 1 diastolic dysfunction  LDL 105  HgbA1c 6.0  VTE prophylaxis - SCDs on,  recommend starting lovenox 40mg  daily Diet: heart healthy 30 day event monitor at time of discharge to evaluate for PAF as an etiology  aspirin 81 mg daily and Eliquis 5mg  prior to admission, now on aspirin 81 mg daily and clopidogrel 75 mg daily. DAPT x 3 weeks then Plavix alone unless atrial flutter/fib is confirmed then change to eliquis  Therapy recommendations:  PT/OT: SNF;Supervision/Assistance - 24 hour,   Disposition:  pending  Hypertension  Home meds:  Metoprolol 25mg  daily  Started on amlodipine 5mg  daily   Stable . BP goal normotensive  Hyperlipidemia  Home meds: rosuvastatin  LDL 105, goal < 70   High intensity statin atorvastatin 80mg  daily  Continue statin at discharge  Diabetes type II Controlled  Home meds: none  HgbA1c 6.0, goal < 7.0  CBGs Recent Labs    06/18/20 2135 06/19/20 0614 06/19/20 1157  GLUCAP 128* 122* 117*      SSI  Other Stroke Risk Factors  Advanced Age >/= 37   Obesity, BMI >/= 30 associated with increased stroke risk, recommend weight loss, diet and exercise as appropriate   Hx stroke (2019) on aspirin  Other Active Problems  Incidental finding: enlarged right thyroid lobe with a 1.7 cm nodule  UTI - check urine culture Hospital day # 6 Continue dual antiplatelet therapy of aspirin Plavix for 3 weeks followed by Plavix alone as no definite evidence of A. fib or a flutter has been found..  Increase Lipitor dose   due to elevated LDL.  Discussed with Dr. [id number].   Stroke team will sign off.  Kindly call for questions.   06/21/20, MD Medical Director [facility] Pager: [phone] 06/19/2020 5:58 PM  To contact Stroke Continuity provider, please refer to Candiss Norse. After hours, contact General Neurology

## 2020-06-19 NOTE — TOC Transition Note (Signed)
Transition of Care [facility]) - CM/SW Discharge Note   Patient Details  Name: Kristy Jensen MRN: [id number] Date of Birth: Sep 28, 1950  Transition of Care [facility]) CM/SW Contact:  Eduard Roux, LCSWA Phone Number: 06/19/2020, 11:45 AM   Clinical Narrative:     Patient will Discharge to: [facility]  Discharge Date: 06/19/2020 Family Notified: [name],son Transport EX:[id number]  RN-please call son when patient is leaving the floor-  Per MD patient is ready for discharge. RN, patient, and facility notified of DC. Discharge Summary sent to facility. RN given number for [phone]. Ambulance transport requested for patient.   Clinical Social Worker signing off.  Antony Blackbird, MSW, LCSW Clinical Social Worker   Final next level of care: Skilled Nursing Facility Barriers to Discharge: Barriers Resolved   Patient Goals and CMS Choice        Discharge Placement                Patient to be transferred to facility by: [facility] Name of family member notified: son, [name] Patient and family notified of of transfer: 06/19/20  Discharge Plan and Services In-house Referral: Clinical Social Work                                   Social Determinants of Health (SDOH) Interventions     Readmission Risk Interventions No flowsheet data found.

## 2020-06-19 NOTE — Care Management Important Message (Signed)
Important Message  Patient Details  Name: Kristy Jensen MRN: [id number] Date of Birth: 05/17/1950   Medicare Important Message Given:  Yes     Renie Ora 06/19/2020, 10:44 AM

## 2020-06-19 NOTE — Progress Notes (Signed)
D/c tele and iV. Pt transferred to [facility] via [facility]. D/c documentations in the chart.   Lawson Radar, RN

## 2020-06-20 LAB — URINE CULTURE: Culture: >=100000 — AB

## 2020-06-27 LAB — VITAMIN B1: Vitamin B1 (Thiamine): 193.6 nmol/L (ref 66.5–200.0)

## 2020-07-11 ENCOUNTER — Encounter: Payer: Self-pay | Admitting: Cardiology

## 2020-07-22 ENCOUNTER — Telehealth: Payer: Self-pay

## 2020-07-22 NOTE — Telephone Encounter (Signed)
I spoke with the nursing facility to get Kristy Jensen scheduled for a hospital follow up. They mentioned that the patient has been declining (possible mini strokes) and wanted her to be seen by our office. I advised if the patient is having mini strokes, she should be taken to the ED. I did get patient scheduled for 3/31 at 1:15. They are also going to fax over any progress notes they have.

## 2020-07-30 ENCOUNTER — Inpatient Hospital Stay (HOSPITAL_COMMUNITY): Payer: Medicare HMO

## 2020-07-30 ENCOUNTER — Emergency Department (HOSPITAL_COMMUNITY): Payer: Medicare HMO

## 2020-07-30 ENCOUNTER — Encounter (HOSPITAL_COMMUNITY): Payer: Self-pay

## 2020-07-30 ENCOUNTER — Inpatient Hospital Stay (HOSPITAL_COMMUNITY)
Admission: EM | Admit: 2020-07-30 | Discharge: 2020-08-13 | DRG: 871 | Disposition: A | Discharge status: Hospice | Payer: Medicare HMO | Source: Skilled Nursing Facility | Attending: Internal Medicine | Admitting: Internal Medicine

## 2020-07-30 DIAGNOSIS — Z20822 Contact with and (suspected) exposure to covid-19: Secondary | ICD-10-CM | POA: Diagnosis present

## 2020-07-30 DIAGNOSIS — E440 Moderate protein-calorie malnutrition: Secondary | ICD-10-CM | POA: Diagnosis present

## 2020-07-30 DIAGNOSIS — R739 Hyperglycemia, unspecified: Secondary | ICD-10-CM | POA: Diagnosis not present

## 2020-07-30 DIAGNOSIS — R6520 Severe sepsis without septic shock: Secondary | ICD-10-CM | POA: Diagnosis not present

## 2020-07-30 DIAGNOSIS — E870 Hyperosmolality and hypernatremia: Secondary | ICD-10-CM | POA: Diagnosis present

## 2020-07-30 DIAGNOSIS — E875 Hyperkalemia: Secondary | ICD-10-CM | POA: Diagnosis present

## 2020-07-30 DIAGNOSIS — R6521 Severe sepsis with septic shock: Secondary | ICD-10-CM | POA: Diagnosis present

## 2020-07-30 DIAGNOSIS — Z8673 Personal history of transient ischemic attack (TIA), and cerebral infarction without residual deficits: Secondary | ICD-10-CM | POA: Diagnosis not present

## 2020-07-30 DIAGNOSIS — Z7401 Bed confinement status: Secondary | ICD-10-CM | POA: Diagnosis not present

## 2020-07-30 DIAGNOSIS — E871 Hypo-osmolality and hyponatremia: Secondary | ICD-10-CM | POA: Diagnosis present

## 2020-07-30 DIAGNOSIS — I248 Other forms of acute ischemic heart disease: Secondary | ICD-10-CM | POA: Diagnosis present

## 2020-07-30 DIAGNOSIS — R778 Other specified abnormalities of plasma proteins: Secondary | ICD-10-CM | POA: Diagnosis not present

## 2020-07-30 DIAGNOSIS — E785 Hyperlipidemia, unspecified: Secondary | ICD-10-CM | POA: Diagnosis present

## 2020-07-30 DIAGNOSIS — L89152 Pressure ulcer of sacral region, stage 2: Secondary | ICD-10-CM | POA: Diagnosis present

## 2020-07-30 DIAGNOSIS — R7303 Prediabetes: Secondary | ICD-10-CM | POA: Diagnosis present

## 2020-07-30 DIAGNOSIS — G928 Other toxic encephalopathy: Secondary | ICD-10-CM | POA: Diagnosis present

## 2020-07-30 DIAGNOSIS — R579 Shock, unspecified: Secondary | ICD-10-CM | POA: Diagnosis not present

## 2020-07-30 DIAGNOSIS — E049 Nontoxic goiter, unspecified: Secondary | ICD-10-CM | POA: Diagnosis present

## 2020-07-30 DIAGNOSIS — A419 Sepsis, unspecified organism: Principal | ICD-10-CM | POA: Diagnosis present

## 2020-07-30 DIAGNOSIS — R4702 Dysphasia: Secondary | ICD-10-CM | POA: Diagnosis present

## 2020-07-30 DIAGNOSIS — N179 Acute kidney failure, unspecified: Secondary | ICD-10-CM | POA: Diagnosis present

## 2020-07-30 DIAGNOSIS — R1310 Dysphagia, unspecified: Secondary | ICD-10-CM | POA: Diagnosis present

## 2020-07-30 DIAGNOSIS — L89322 Pressure ulcer of left buttock, stage 2: Secondary | ICD-10-CM | POA: Diagnosis present

## 2020-07-30 DIAGNOSIS — Z9049 Acquired absence of other specified parts of digestive tract: Secondary | ICD-10-CM

## 2020-07-30 DIAGNOSIS — Z66 Do not resuscitate: Secondary | ICD-10-CM | POA: Diagnosis present

## 2020-07-30 DIAGNOSIS — A021 Salmonella sepsis: Secondary | ICD-10-CM | POA: Diagnosis not present

## 2020-07-30 DIAGNOSIS — Z6832 Body mass index (BMI) 32.0-32.9, adult: Secondary | ICD-10-CM

## 2020-07-30 DIAGNOSIS — I471 Supraventricular tachycardia, unspecified: Secondary | ICD-10-CM

## 2020-07-30 DIAGNOSIS — Z515 Encounter for palliative care: Secondary | ICD-10-CM | POA: Diagnosis not present

## 2020-07-30 DIAGNOSIS — G9341 Metabolic encephalopathy: Secondary | ICD-10-CM | POA: Diagnosis present

## 2020-07-30 DIAGNOSIS — E669 Obesity, unspecified: Secondary | ICD-10-CM | POA: Diagnosis present

## 2020-07-30 DIAGNOSIS — Z7189 Other specified counseling: Secondary | ICD-10-CM | POA: Diagnosis not present

## 2020-07-30 DIAGNOSIS — R54 Age-related physical debility: Secondary | ICD-10-CM | POA: Diagnosis present

## 2020-07-30 DIAGNOSIS — L8990 Pressure ulcer of unspecified site, unspecified stage: Secondary | ICD-10-CM | POA: Diagnosis present

## 2020-07-30 DIAGNOSIS — I4891 Unspecified atrial fibrillation: Secondary | ICD-10-CM | POA: Diagnosis not present

## 2020-07-30 DIAGNOSIS — E860 Dehydration: Secondary | ICD-10-CM | POA: Diagnosis present

## 2020-07-30 DIAGNOSIS — I6300 Cerebral infarction due to thrombosis of unspecified precerebral artery: Secondary | ICD-10-CM | POA: Diagnosis not present

## 2020-07-30 DIAGNOSIS — Z7984 Long term (current) use of oral hypoglycemic drugs: Secondary | ICD-10-CM

## 2020-07-30 DIAGNOSIS — E876 Hypokalemia: Secondary | ICD-10-CM | POA: Diagnosis present

## 2020-07-30 DIAGNOSIS — G6281 Critical illness polyneuropathy: Secondary | ICD-10-CM | POA: Diagnosis not present

## 2020-07-30 DIAGNOSIS — D6489 Other specified anemias: Secondary | ICD-10-CM | POA: Diagnosis present

## 2020-07-30 DIAGNOSIS — N390 Urinary tract infection, site not specified: Secondary | ICD-10-CM | POA: Diagnosis present

## 2020-07-30 DIAGNOSIS — Z79899 Other long term (current) drug therapy: Secondary | ICD-10-CM

## 2020-07-30 DIAGNOSIS — E872 Acidosis: Secondary | ICD-10-CM | POA: Diagnosis present

## 2020-07-30 DIAGNOSIS — N170 Acute kidney failure with tubular necrosis: Secondary | ICD-10-CM | POA: Diagnosis not present

## 2020-07-30 DIAGNOSIS — I480 Paroxysmal atrial fibrillation: Secondary | ICD-10-CM | POA: Diagnosis present

## 2020-07-30 DIAGNOSIS — I639 Cerebral infarction, unspecified: Secondary | ICD-10-CM | POA: Diagnosis present

## 2020-07-30 DIAGNOSIS — I10 Essential (primary) hypertension: Secondary | ICD-10-CM | POA: Diagnosis present

## 2020-07-30 DIAGNOSIS — Z789 Other specified health status: Secondary | ICD-10-CM | POA: Diagnosis not present

## 2020-07-30 LAB — URINALYSIS, ROUTINE W REFLEX MICROSCOPIC
Bilirubin Urine: NEGATIVE
Glucose, UA: 50 mg/dL — AB
Ketones, ur: 5 mg/dL — AB
Nitrite: NEGATIVE
Protein, ur: 100 mg/dL — AB
Specific Gravity, Urine: 1.017 (ref 1.005–1.030)
WBC, UA: >50 WBC/hpf — ABNORMAL HIGH (ref 0–5)
pH: 5 (ref 5.0–8.0)

## 2020-07-30 LAB — COMPREHENSIVE METABOLIC PANEL
ALT: 10 U/L (ref 0–44)
ALT: 6 U/L (ref 0–44)
AST: 15 U/L (ref 15–41)
AST: 17 U/L (ref 15–41)
Albumin: 3.3 g/dL — ABNORMAL LOW (ref 3.5–5.0)
Albumin: 2.7 g/dL — ABNORMAL LOW (ref 3.5–5.0)
Alkaline Phosphatase: 71 U/L (ref 38–126)
Alkaline Phosphatase: 65 U/L (ref 38–126)
Anion gap: 17 — ABNORMAL HIGH (ref 5–15)
Anion gap: 18 — ABNORMAL HIGH (ref 5–15)
BUN: 150 mg/dL — ABNORMAL HIGH (ref 8–23)
BUN: 142 mg/dL — ABNORMAL HIGH (ref 8–23)
CO2: 13 mmol/L — ABNORMAL LOW (ref 22–32)
CO2: 11 mmol/L — ABNORMAL LOW (ref 22–32)
Calcium: 9.8 mg/dL (ref 8.9–10.3)
Calcium: 8.9 mg/dL (ref 8.9–10.3)
Chloride: 121 mmol/L — ABNORMAL HIGH (ref 98–111)
Chloride: 123 mmol/L — ABNORMAL HIGH (ref 98–111)
Creatinine, Ser: 3.9 mg/dL — ABNORMAL HIGH (ref 0.44–1.00)
Creatinine, Ser: 3.43 mg/dL — ABNORMAL HIGH (ref 0.44–1.00)
GFR, Estimated: 12 mL/min — ABNORMAL LOW (ref >60)
GFR, Estimated: 14 mL/min — ABNORMAL LOW (ref >60)
Glucose, Bld: 172 mg/dL — ABNORMAL HIGH (ref 70–99)
Glucose, Bld: 140 mg/dL — ABNORMAL HIGH (ref 70–99)
Potassium: 5.5 mmol/L — ABNORMAL HIGH (ref 3.5–5.1)
Potassium: 5.4 mmol/L — ABNORMAL HIGH (ref 3.5–5.1)
Sodium: 151 mmol/L — ABNORMAL HIGH (ref 135–145)
Sodium: 152 mmol/L — ABNORMAL HIGH (ref 135–145)
Total Bilirubin: 0.9 mg/dL (ref 0.3–1.2)
Total Bilirubin: 0.8 mg/dL (ref 0.3–1.2)
Total Protein: 6.7 g/dL (ref 6.5–8.1)
Total Protein: 5.8 g/dL — ABNORMAL LOW (ref 6.5–8.1)

## 2020-07-30 LAB — CBC WITH DIFFERENTIAL/PLATELET
Abs Immature Granulocytes: 0.13 10*3/uL — ABNORMAL HIGH (ref 0.00–0.07)
Abs Immature Granulocytes: 0.09 10*3/uL — ABNORMAL HIGH (ref 0.00–0.07)
Basophils Absolute: 0 10*3/uL (ref 0.0–0.1)
Basophils Absolute: 0 10*3/uL (ref 0.0–0.1)
Basophils Relative: 0 %
Basophils Relative: 0 %
Eosinophils Absolute: 0 10*3/uL (ref 0.0–0.5)
Eosinophils Absolute: 0 10*3/uL (ref 0.0–0.5)
Eosinophils Relative: 0 %
Eosinophils Relative: 0 %
HCT: 48.2 % — ABNORMAL HIGH (ref 36.0–46.0)
HCT: 45.7 % (ref 36.0–46.0)
Hemoglobin: 15.1 g/dL — ABNORMAL HIGH (ref 12.0–15.0)
Hemoglobin: 13.3 g/dL (ref 12.0–15.0)
Immature Granulocytes: 1 %
Immature Granulocytes: 1 %
Lymphocytes Relative: 8 %
Lymphocytes Relative: 11 %
Lymphs Abs: 1.5 10*3/uL (ref 0.7–4.0)
Lymphs Abs: 1.6 10*3/uL (ref 0.7–4.0)
MCH: 28.5 pg (ref 26.0–34.0)
MCH: 28.2 pg (ref 26.0–34.0)
MCHC: 31.3 g/dL (ref 30.0–36.0)
MCHC: 29.1 g/dL — ABNORMAL LOW (ref 30.0–36.0)
MCV: 90.9 fL (ref 80.0–100.0)
MCV: 97 fL (ref 80.0–100.0)
Monocytes Absolute: 1.1 10*3/uL — ABNORMAL HIGH (ref 0.1–1.0)
Monocytes Absolute: 0.8 10*3/uL (ref 0.1–1.0)
Monocytes Relative: 6 %
Monocytes Relative: 5 %
Neutro Abs: 17.1 10*3/uL — ABNORMAL HIGH (ref 1.7–7.7)
Neutro Abs: 11.9 10*3/uL — ABNORMAL HIGH (ref 1.7–7.7)
Neutrophils Relative %: 85 %
Neutrophils Relative %: 83 %
Platelets: 247 10*3/uL (ref 150–400)
Platelets: 185 10*3/uL (ref 150–400)
RBC: 5.3 MIL/uL — ABNORMAL HIGH (ref 3.87–5.11)
RBC: 4.71 MIL/uL (ref 3.87–5.11)
RDW: 15.2 % (ref 11.5–15.5)
RDW: 15.4 % (ref 11.5–15.5)
WBC: 19.8 10*3/uL — ABNORMAL HIGH (ref 4.0–10.5)
WBC: 14.4 10*3/uL — ABNORMAL HIGH (ref 4.0–10.5)
nRBC: 0 % (ref 0.0–0.2)
nRBC: 0 % (ref 0.0–0.2)

## 2020-07-30 LAB — LACTIC ACID, PLASMA
Lactic Acid, Venous: 2.4 mmol/L (ref 0.5–1.9)
Lactic Acid, Venous: 3.9 mmol/L (ref 0.5–1.9)
Lactic Acid, Venous: 2 mmol/L (ref 0.5–1.9)
Lactic Acid, Venous: 4.1 mmol/L (ref 0.5–1.9)
Lactic Acid, Venous: 3 mmol/L (ref 0.5–1.9)
Lactic Acid, Venous: 2.2 mmol/L (ref 0.5–1.9)

## 2020-07-30 LAB — PROCALCITONIN
Procalcitonin: <0.1 ng/mL
Procalcitonin: 0.18 ng/mL

## 2020-07-30 LAB — BASIC METABOLIC PANEL
Anion gap: 15 (ref 5–15)
Anion gap: 8 (ref 5–15)
BUN: 115 mg/dL — ABNORMAL HIGH (ref 8–23)
BUN: 96 mg/dL — ABNORMAL HIGH (ref 8–23)
CO2: 18 mmol/L — ABNORMAL LOW (ref 22–32)
CO2: 26 mmol/L (ref 22–32)
Calcium: 8.3 mg/dL — ABNORMAL LOW (ref 8.9–10.3)
Calcium: 7.8 mg/dL — ABNORMAL LOW (ref 8.9–10.3)
Chloride: 119 mmol/L — ABNORMAL HIGH (ref 98–111)
Chloride: 116 mmol/L — ABNORMAL HIGH (ref 98–111)
Creatinine, Ser: 2.64 mg/dL — ABNORMAL HIGH (ref 0.44–1.00)
Creatinine, Ser: 1.88 mg/dL — ABNORMAL HIGH (ref 0.44–1.00)
GFR, Estimated: 19 mL/min — ABNORMAL LOW (ref >60)
GFR, Estimated: 29 mL/min — ABNORMAL LOW (ref >60)
Glucose, Bld: 118 mg/dL — ABNORMAL HIGH (ref 70–99)
Glucose, Bld: 169 mg/dL — ABNORMAL HIGH (ref 70–99)
Potassium: 4.4 mmol/L (ref 3.5–5.1)
Potassium: 3.3 mmol/L — ABNORMAL LOW (ref 3.5–5.1)
Sodium: 152 mmol/L — ABNORMAL HIGH (ref 135–145)
Sodium: 150 mmol/L — ABNORMAL HIGH (ref 135–145)

## 2020-07-30 LAB — I-STAT VENOUS BLOOD GAS, ED
Acid-base deficit: 8 mmol/L — ABNORMAL HIGH (ref 0.0–2.0)
Bicarbonate: 17.8 mmol/L — ABNORMAL LOW (ref 20.0–28.0)
Calcium, Ion: 1.06 mmol/L — ABNORMAL LOW (ref 1.15–1.40)
HCT: 33 % — ABNORMAL LOW (ref 36.0–46.0)
Hemoglobin: 11.2 g/dL — ABNORMAL LOW (ref 12.0–15.0)
O2 Saturation: 51 %
Potassium: 4.4 mmol/L (ref 3.5–5.1)
Sodium: 152 mmol/L — ABNORMAL HIGH (ref 135–145)
TCO2: 19 mmol/L — ABNORMAL LOW (ref 22–32)
pCO2, Ven: 37.3 mmHg — ABNORMAL LOW (ref 44.0–60.0)
pH, Ven: 7.286 (ref 7.250–7.430)
pO2, Ven: 30 mmHg — CL (ref 32.0–45.0)

## 2020-07-30 LAB — TROPONIN I (HIGH SENSITIVITY)
Troponin I (High Sensitivity): 77 ng/L — ABNORMAL HIGH (ref <18)
Troponin I (High Sensitivity): 115 ng/L (ref <18)
Troponin I (High Sensitivity): 202 ng/L (ref <18)

## 2020-07-30 LAB — GLUCOSE, CAPILLARY
Glucose-Capillary: 64 mg/dL — ABNORMAL LOW (ref 70–99)
Glucose-Capillary: 74 mg/dL (ref 70–99)
Glucose-Capillary: 104 mg/dL — ABNORMAL HIGH (ref 70–99)
Glucose-Capillary: 119 mg/dL — ABNORMAL HIGH (ref 70–99)
Glucose-Capillary: 134 mg/dL — ABNORMAL HIGH (ref 70–99)

## 2020-07-30 LAB — RESP PANEL BY RT-PCR (FLU A&B, COVID) ARPGX2
Influenza A by PCR: NEGATIVE
Influenza B by PCR: NEGATIVE
SARS Coronavirus 2 by RT PCR: NEGATIVE

## 2020-07-30 LAB — CBG MONITORING, ED
Glucose-Capillary: 135 mg/dL — ABNORMAL HIGH (ref 70–99)
Glucose-Capillary: 105 mg/dL — ABNORMAL HIGH (ref 70–99)
Glucose-Capillary: 88 mg/dL (ref 70–99)

## 2020-07-30 LAB — APTT: aPTT: 22 seconds — ABNORMAL LOW (ref 24–36)

## 2020-07-30 LAB — PROTIME-INR
INR: 1.4 — ABNORMAL HIGH (ref 0.8–1.2)
Prothrombin Time: 16.3 seconds — ABNORMAL HIGH (ref 11.4–15.2)

## 2020-07-30 LAB — AMYLASE: Amylase: 280 U/L — ABNORMAL HIGH (ref 28–100)

## 2020-07-30 LAB — CORTISOL: Cortisol, Plasma: 22.1 ug/dL

## 2020-07-30 LAB — MAGNESIUM: Magnesium: 1.6 mg/dL — ABNORMAL LOW (ref 1.7–2.4)

## 2020-07-30 LAB — TSH: TSH: 1.675 u[IU]/mL (ref 0.350–4.500)

## 2020-07-30 LAB — MRSA PCR SCREENING: MRSA by PCR: NEGATIVE

## 2020-07-30 LAB — LIPASE, BLOOD: Lipase: 145 U/L — ABNORMAL HIGH (ref 11–51)

## 2020-07-30 LAB — STREP PNEUMONIAE URINARY ANTIGEN: Strep Pneumo Urinary Antigen: NEGATIVE

## 2020-07-30 LAB — HEPARIN LEVEL (UNFRACTIONATED): Heparin Unfractionated: 0.49 IU/mL (ref 0.30–0.70)

## 2020-07-30 MED ORDER — HEPARIN SODIUM (PORCINE) 5000 UNIT/ML IJ SOLN: 5000.0000 [IU] | Freq: Three times a day (TID) | INTRAMUSCULAR | Status: DC

## 2020-07-30 MED ORDER — CHLORHEXIDINE GLUCONATE CLOTH 2 % EX PADS
6.0000 | MEDICATED_PAD | Freq: Every day | CUTANEOUS | Status: DC
  Administered 2020-07-30 – 2020-08-06 (×8): 6 via TOPICAL

## 2020-07-30 MED ORDER — METRONIDAZOLE IN NACL 5-0.79 MG/ML-% IV SOLN
500.0000 mg | Freq: Three times a day (TID) | INTRAVENOUS | Status: DC
  Administered 2020-07-30 – 2020-07-31 (×3): 500 mg via INTRAVENOUS
  Filled 2020-07-30 (×3): qty 100

## 2020-07-30 MED ORDER — VANCOMYCIN HCL 1000 MG/200ML IV SOLN: 1000.0000 mg | Freq: Once | INTRAVENOUS | Status: DC

## 2020-07-30 MED ORDER — DEXTROSE-NACL 5-0.45 % IV SOLN: INTRAVENOUS | Status: DC

## 2020-07-30 MED ORDER — LACTATED RINGERS IV SOLN: INTRAVENOUS | Status: DC

## 2020-07-30 MED ORDER — PANTOPRAZOLE SODIUM 40 MG IV SOLR
40.0000 mg | Freq: Every day | INTRAVENOUS | Status: DC
  Administered 2020-07-30: 40 mg via INTRAVENOUS
  Filled 2020-07-30: qty 40

## 2020-07-30 MED ORDER — LACTATED RINGERS IV BOLUS (SEPSIS)
1000.0000 mL | Freq: Once | INTRAVENOUS | Status: AC
  Administered 2020-07-30: 1000 mL via INTRAVENOUS

## 2020-07-30 MED ORDER — ACETAMINOPHEN 325 MG PO TABS
650.0000 mg | ORAL_TABLET | Freq: Four times a day (QID) | ORAL | Status: DC | PRN
  Administered 2020-08-05: 650 mg via ORAL
  Filled 2020-07-30: qty 2

## 2020-07-30 MED ORDER — VANCOMYCIN HCL 2000 MG/400ML IV SOLN
2000.0000 mg | Freq: Once | INTRAVENOUS | Status: AC
  Administered 2020-07-30: 2000 mg via INTRAVENOUS
  Filled 2020-07-30: qty 400

## 2020-07-30 MED ORDER — INSULIN ASPART 100 UNIT/ML ~~LOC~~ SOLN: 0.0000 [IU] | SUBCUTANEOUS | Status: DC

## 2020-07-30 MED ORDER — ACETAMINOPHEN 650 MG RE SUPP: 650.0000 mg | Freq: Four times a day (QID) | RECTAL | Status: DC | PRN

## 2020-07-30 MED ORDER — STERILE WATER FOR INJECTION IV SOLN
INTRAVENOUS | Status: DC
  Filled 2020-07-30 (×3): qty 850

## 2020-07-30 MED ORDER — ALBUTEROL SULFATE (2.5 MG/3ML) 0.083% IN NEBU
5.0000 mg | INHALATION_SOLUTION | Freq: Once | RESPIRATORY_TRACT | Status: AC
  Administered 2020-07-30: 5 mg via RESPIRATORY_TRACT
  Filled 2020-07-30: qty 6

## 2020-07-30 MED ORDER — SODIUM ZIRCONIUM CYCLOSILICATE 10 G PO PACK
10.0000 g | PACK | ORAL | Status: DC
  Filled 2020-07-30: qty 1

## 2020-07-30 MED ORDER — LACTATED RINGERS IV BOLUS
1000.0000 mL | Freq: Once | INTRAVENOUS | Status: AC
  Administered 2020-07-30: 1000 mL via INTRAVENOUS

## 2020-07-30 MED ORDER — METOPROLOL TARTRATE 5 MG/5ML IV SOLN
5.0000 mg | Freq: Once | INTRAVENOUS | Status: AC
  Administered 2020-07-30: 5 mg via INTRAVENOUS
  Filled 2020-07-30: qty 5

## 2020-07-30 MED ORDER — VANCOMYCIN VARIABLE DOSE PER UNSTABLE RENAL FUNCTION (PHARMACIST DOSING): Status: DC

## 2020-07-30 MED ORDER — METOPROLOL TARTRATE 5 MG/5ML IV SOLN
2.5000 mg | INTRAVENOUS | Status: DC | PRN
  Administered 2020-07-30: 5 mg via INTRAVENOUS
  Filled 2020-07-30: qty 5

## 2020-07-30 MED ORDER — SODIUM CHLORIDE 0.9 % IV SOLN
INTRAVENOUS | Status: DC | PRN
  Administered 2020-07-30: 500 mL via INTRAVENOUS

## 2020-07-30 MED ORDER — SODIUM CHLORIDE 0.9 % IV BOLUS
1000.0000 mL | Freq: Once | INTRAVENOUS | Status: AC
  Administered 2020-07-30: 1000 mL via INTRAVENOUS

## 2020-07-30 MED ORDER — HEPARIN (PORCINE) 25000 UT/250ML-% IV SOLN
1250.0000 [IU]/h | INTRAVENOUS | Status: DC
  Administered 2020-07-30 (×3): 1200 [IU]/h via INTRAVENOUS
  Administered 2020-07-31: 1150 [IU]/h via INTRAVENOUS
  Administered 2020-08-01: 1100 [IU]/h via INTRAVENOUS
  Administered 2020-08-02 – 2020-08-03 (×2): 1150 [IU]/h via INTRAVENOUS
  Filled 2020-07-30 (×6): qty 250

## 2020-07-30 MED ORDER — SODIUM CHLORIDE 0.9 % IV SOLN
2.0000 g | Freq: Once | INTRAVENOUS | Status: AC
  Administered 2020-07-30: 2 g via INTRAVENOUS
  Filled 2020-07-30: qty 2

## 2020-07-30 MED ORDER — MAGNESIUM SULFATE 2 GM/50ML IV SOLN
2.0000 g | Freq: Once | INTRAVENOUS | Status: AC
  Administered 2020-07-30: 2 g via INTRAVENOUS
  Filled 2020-07-30: qty 50

## 2020-07-30 MED ORDER — DOCUSATE SODIUM 100 MG PO CAPS: 100.0000 mg | ORAL_CAPSULE | Freq: Two times a day (BID) | ORAL | Status: DC | PRN

## 2020-07-30 MED ORDER — SODIUM BICARBONATE 8.4 % IV SOLN
INTRAVENOUS | Status: DC
  Filled 2020-07-30 (×2): qty 850

## 2020-07-30 MED ORDER — HEPARIN BOLUS VIA INFUSION
4000.0000 [IU] | Freq: Once | INTRAVENOUS | Status: AC
  Administered 2020-07-30: 4000 [IU] via INTRAVENOUS
  Filled 2020-07-30: qty 4000

## 2020-07-30 MED ORDER — DEXTROSE 50 % IV SOLN
12.5000 g | INTRAVENOUS | Status: AC
  Administered 2020-07-30: 12.5 g via INTRAVENOUS

## 2020-07-30 MED ORDER — INSULIN ASPART 100 UNIT/ML ~~LOC~~ SOLN
0.0000 [IU] | SUBCUTANEOUS | Status: DC
  Administered 2020-08-02 (×2): 1 [IU] via SUBCUTANEOUS

## 2020-07-30 MED ORDER — POLYETHYLENE GLYCOL 3350 17 G PO PACK: 17.0000 g | PACK | Freq: Every day | ORAL | Status: DC | PRN

## 2020-07-30 MED ORDER — METRONIDAZOLE IN NACL 5-0.79 MG/ML-% IV SOLN
500.0000 mg | Freq: Once | INTRAVENOUS | Status: AC
  Administered 2020-07-30: 500 mg via INTRAVENOUS
  Filled 2020-07-30: qty 100

## 2020-07-30 MED ORDER — DEXTROSE 50 % IV SOLN
1.0000 | Freq: Once | INTRAVENOUS | Status: AC
  Administered 2020-07-30: 50 mL via INTRAVENOUS
  Filled 2020-07-30 (×2): qty 50

## 2020-07-30 MED ORDER — POTASSIUM CHLORIDE 10 MEQ/100ML IV SOLN
10.0000 meq | INTRAVENOUS | Status: AC
  Administered 2020-07-30 – 2020-07-31 (×2): 10 meq via INTRAVENOUS
  Filled 2020-07-30 (×2): qty 100

## 2020-07-30 MED ORDER — SODIUM CHLORIDE 0.9 % IV SOLN
1.0000 g | Freq: Three times a day (TID) | INTRAVENOUS | Status: DC
  Administered 2020-07-30 – 2020-08-01 (×8): 1 g via INTRAVENOUS
  Filled 2020-07-30 (×14): qty 1

## 2020-07-30 MED ORDER — DEXTROSE IN LACTATED RINGERS 5 % IV SOLN: INTRAVENOUS | Status: DC

## 2020-07-30 MED ORDER — DEXTROSE 50 % IV SOLN
INTRAVENOUS | Status: AC
  Filled 2020-07-30: qty 50

## 2020-07-30 MED ORDER — SODIUM BICARBONATE 8.4 % IV SOLN
50.0000 meq | Freq: Once | INTRAVENOUS | Status: AC
  Administered 2020-07-30: 50 meq via INTRAVENOUS
  Filled 2020-07-30: qty 50

## 2020-07-30 MED ORDER — SODIUM CHLORIDE 0.45 % IV SOLN: INTRAVENOUS | Status: DC

## 2020-07-30 NOTE — Progress Notes (Signed)
ANTICOAGULATION CONSULT NOTE - Initial Consult  Pharmacy Consult for Heparin Indication: atrial fibrillation  Allergies  Allergen Reactions  . Penicillins Swelling and Other (See Comments)    Throat swelling/ turned red  . Sulfa Antibiotics Swelling and Other (See Comments)    Throat swelling/turned red    Patient Measurements: Height: 5\' 9"  (175.3 cm) Weight: 99.8 kg (220 lb) IBW/kg (Calculated) : 66.2 Heparin Dosing Weight: 87 kg  Vital Signs: Temp: 97.9 F (36.6 C) (03/30 0052) Temp Source: Oral (03/30 0052) BP: 101/64 (03/30 0245) Pulse Rate: 90 (03/30 0245)  Labs: Recent Labs    07/30/20 0059  HGB 15.1*  HCT 48.2*  PLT 247  APTT 22*  LABPROT 16.3*  INR 1.4*  CREATININE 3.90*    Estimated Creatinine Clearance: 17.1 mL/min (A) (by C-G formula based on SCr of 3.9 mg/dL (H)).   Medical History: Past Medical History:  Diagnosis Date  . Hyperlipidemia   . Prediabetes   . Stroke [facility])     Medications:  See electronic med rec  Assessment: 70 y.o. F presents with sepsis. Found to be in afib and heparin per pharmacy ordered. CBC ok on admission. No AC PTA.  Goal of Therapy:  Heparin level 0.3-0.7 units/ml Monitor platelets by anticoagulation protocol: Yes   Plan:  Heparin IV bolus 4000 units Heparin gtt at 1200 units/hr Will f/u heparin level in 8 hours Daily heparin level and CBC  78, PharmD, BCPS Please see amion for complete clinical pharmacist phone list 07/30/2020,5:20 AM

## 2020-07-30 NOTE — Consult Note (Addendum)
NAME:  Kristy Jensen, MRN:  [id number], DOB:  24-Dec-1950, LOS: 0 ADMISSION DATE:  07/30/2020, CONSULTATION DATE: 07/30/2020 REFERRING MD: [facility], CHIEF COMPLAINT: Altered mental status hypotension  History of Present Illness:  Frail-appearing 70 year old female with a history of CVA, urinary tract infections, atrial fibrillation ventricular response and SVT.  She was discharged in February 2022 urinary tract infection and altered mental status.  Now presents with altered mental status ventricular heart rate with atrial fibrillation .  Further noted to have acute renal failure, hyper kalemia and hyper natremia.  Pulmonary critical care was asked to come to the bedside due to hypotension and rapid ventricular rate in the setting of atrial fibrillation.  She is poorly responsive.  She is in acute renal failure with creatinine 3.43 with a BUN of 142 normal creatinine is 0.9.  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  She appears to be under hydrated at this time we will institute another 1000 cc of lactated Ringer's and repeat 5 mg of Metroprolol.  Will admit to the intensive care unit for further evaluation and treatment.  Cardiology has been consulted have not evaluated patient at this time.  Her last 2D echo in February 2022 shows EF 60% and grade 1 diastolic dysfunction.  She appears much older than her stated age and appears to be failure to thrive.  Pertinent  Medical History   Past Medical History:  Diagnosis Date  . Hyperlipidemia   . Prediabetes   . Stroke [facility])      Significant Hospital Events: Including procedures, antibiotic start and stop dates in addition to other pertinent events   . 07/30/2020 admit to the intensive care unit  Interim History / Subjective:  Admitted with hypotension atrial fibrillation rapid ventricular response elevated white count with dirty urine  Objective   Blood pressure (!) 81/55, pulse (!) 150, temperature 97.9 F (36.6 C), temperature  source Oral, resp. rate 14, height 5\' 9"  (1.753 m), weight 99.8 kg, SpO2 99 %.        Intake/Output Summary (Last 24 hours) at 07/30/2020 0756 Last data filed at 07/30/2020 0534 Gross per 24 hour  Intake 4512.33 ml  Output --  Net 4512.33 ml   Filed Weights   07/30/20 0056  Weight: 99.8 kg    Examination: General: Frail elderly female HENT: Large mass noted at mid neck line consistent with goiter.  Extremely dry oral mucosa Lungs: Decreased breath sounds throughout Cardiovascular: Heart sounds are irregular irregular with rate varying from 1 30-1 66 with a systolic blood pressure greater than 100 Abdomen: Obese soft faint bowel sounds Extremities: Lower extremities with 1+ edema, upper extremities with poor skin turgor Neuro: Will open eyes and focus at times with strong stability GU: Pure wick is in place  Labs/imaging that I havepersonally reviewed  (right click and "Reselect all SmartList Selections" daily)  Chest x-ray, CT of the head, comprehensive metabolic package, procalcitonin  Resolved Hospital Problem list     Assessment & Plan:  Shock most likely multifactorial in origin with underlying volume under load exacerbated by atrial fibrillation ventricular response and suspected infection of urinary source. Admit to the intensive care unit Transition to pulmonary critical care service at this time Empirical antimicrobial therapy Aggressive rehydration known EF 60% with grade 1 diastolic dysfunction Low-dose beta-blocker in setting of atrial fibrillation Check procalcitonin Panculture  Atrial fibrillation with rapid ventricular response on ACE inhibitor and beta-blocker at home setting Aggressive hydration IV beta-blockers May require Cardizem drip  if blood pressure allows Cardiology consult has been called Currently on heparin drip for atrial fibrillation noted to have negative head CT  Altered mental status with past medical history of CVA 07/30/2020 CT shows no  acute abnormality old stroke noted Admit to intensive care unit Monitor mental status  Acute renal failure, hypernatremia, hyperkalemia in the setting of volume underlying while being on ACE inhibitor Lab Results  Component Value Date   CREATININE 3.43 (H) 07/30/2020   CREATININE 3.90 (H) 07/30/2020   CREATININE 0.87 06/19/2020   Recent Labs  Lab 07/30/20 0059 07/30/20 0517  K 5.5* 5.4*   Recent Labs  Lab 07/30/20 0059 07/30/20 0517  NA 151* 152*   Aggressive hydration Avoid nephrotoxins Repeat basic metabolic package within 4 hours Sodium bicarbonate drip instituted   Goiter cannot rule out some type of cancerous process. Check TSH CT of the neck when stable     Best practice (right click and "Reselect all SmartList Selections" daily)  Diet:  NPO Pain/Anxiety/Delirium protocol (if indicated): No VAP protocol (if indicated): Yes DVT prophylaxis: Systemic AC GI prophylaxis: PPI Glucose control:  SSI No Central venous access:  N/A Arterial line:  N/A Foley:  N/A Mobility:  bed rest  PT consulted: N/A Last date of multidisciplinary goals of care discussion [not as of yet] Code Status:  full code, needs to be addressed. Disposition: ICU  Labs   CBC: Recent Labs  Lab 07/30/20 0059 07/30/20 0517  WBC 19.8* 14.4*  NEUTROABS 17.1* 11.9*  HGB 15.1* 13.3  HCT 48.2* 45.7  MCV 90.9 97.0  PLT 247 185    Basic Metabolic Panel: Recent Labs  Lab 07/30/20 0059 07/30/20 0517  NA 151* 152*  K 5.5* 5.4*  CL 121* 123*  CO2 13* 11*  GLUCOSE 172* 140*  BUN 150* 142*  CREATININE 3.90* 3.43*  CALCIUM 9.8 8.9   GFR: Estimated Creatinine Clearance: 19.5 mL/min (A) (by C-G formula based on SCr of 3.43 mg/dL (H)). Recent Labs  Lab 07/30/20 0059 07/30/20 0127 07/30/20 0245 07/30/20 0517 07/30/20 0522  PROCALCITON  --   --   --  <0.10  --   WBC 19.8*  --   --  14.4*  --   LATICACIDVEN  --  2.4* 3.9*  --  2.0*    Liver Function Tests: Recent Labs  Lab  07/30/20 0059 07/30/20 0517  AST 15 17  ALT 10 6  ALKPHOS 71 65  BILITOT 0.9 0.8  PROT 6.7 5.8*  ALBUMIN 3.3* 2.7*   No results for input(s): LIPASE, AMYLASE in the last 168 hours. No results for input(s): AMMONIA in the last 168 hours.  ABG    Component Value Date/Time   TCO2 20 (L) 06/13/2020 1506     Coagulation Profile: Recent Labs  Lab 07/30/20 0059  INR 1.4*    Cardiac Enzymes: No results for input(s): CKTOTAL, CKMB, CKMBINDEX, TROPONINI in the last 168 hours.  HbA1C: Hgb A1c MFr Bld  Date/Time Value Ref Range Status  06/14/2020 03:37 AM 6.0 (H) 4.8 - 5.6 % Final    Comment:    (NOTE) Pre diabetes:          5.7%-6.4%  Diabetes:              >6.4%  Glycemic control for   <7.0% adults with diabetes     CBG: Recent Labs  Lab 07/30/20 0423 07/30/20 0734  GLUCAP 135* 105*    Review of Systems:   NA due to ams.  Chart reviewed  Past Medical History:  She,  has a past medical history of Hyperlipidemia, Prediabetes, and Stroke (HCC).   Surgical History:  History reviewed. No pertinent surgical history.   Social History:   reports that she has never smoked. She has never used smokeless tobacco. She reports current alcohol use. She reports that she does not use drugs.   Family History:  Her family history is negative for Heart disease.   Allergies Allergies  Allergen Reactions  . Penicillins Swelling and Other (See Comments)    Throat swelling/ turned red  . Sulfa Antibiotics Swelling and Other (See Comments)    Throat swelling/turned red     Home Medications  Prior to Admission medications   Medication Sig Start Date End Date Taking? Authorizing Provider  fosinopril (MONOPRIL) 40 MG tablet Take 40 mg by mouth daily.   Yes [provider]  metFORMIN (GLUCOPHAGE) 500 MG tablet Take 1 tablet (500 mg total) by mouth 2 (two) times daily with a meal. 06/19/20 07/19/20 Yes Pahwani, Daleen Bo, MD  metoprolol succinate (TOPROL-XL) 25 MG 24 hr  tablet Take 25 mg by mouth daily. 12/21/19  Yes [provider]  atorvastatin (LIPITOR) 80 MG tablet Take 1 tablet (80 mg total) by mouth daily at 6 PM. 06/19/20 07/19/20  Hughie Closs, MD  FLUoxetine (PROZAC) 10 MG capsule Take 1 capsule (10 mg total) by mouth daily. 06/19/20 07/19/20  Hughie Closs, MD     Critical care time: 45 min     Brett Canales Jayro Mcmath ACNP Acute Care Nurse Practitioner [facility] Pulmonary/Critical Care Please consult Amion 07/30/2020, 7:56 AM

## 2020-07-30 NOTE — ED Notes (Signed)
[phone] pt daughter Waldo Laine daughter would like an update

## 2020-07-30 NOTE — Progress Notes (Signed)
eLink Physician-Brief Progress Note Patient Name: Kristy Jensen DOB: [birth year] MRN: [id number]   Date of Service  07/30/2020  HPI/Events of Note  Bicarb 26 K3.3  eICU Interventions  Hypokalemia / hypomag -repleted Dc bicarb gtt  start D51/2 NS instead Elevated trops -follow      Intervention Category Major Interventions: Electrolyte abnormality - evaluation and management  Helton Oleson V. Chigozie Basaldua 07/30/2020, 11:37 PM

## 2020-07-30 NOTE — Progress Notes (Signed)
Two RT's attempted ABG with no success. CCM made aware. Venous blood gas ordered.

## 2020-07-30 NOTE — Progress Notes (Signed)
Patient seen and examined at bedside.  Patient is a critically ill with multiple issues.  PCCM at bedside.  At this time PCCM will take over care for the patient.  Will sign off

## 2020-07-30 NOTE — ED Provider Notes (Signed)
[facility]   CSN: [id number] Arrival date & time: 07/30/20  0041     History Chief Complaint  Patient presents with  . Altered Mental Status    Kristy GandyVirginia Jensen is a 70 y.o. female.  The history is provided by the EMS personnel and medical records. The history is limited by the condition of the patient.  Altered Mental Status Presenting symptoms: lethargy   Severity:  Moderate Most recent episode:  Yesterday Episode history:  Single Timing:  Constant Progression:  Unchanged Chronicity:  Recurrent Context: nursing home resident, recent illness and recent infection   Associated symptoms: no fever, no rash, no seizures and no vomiting   Patient with h/o CVA presents with AMS and sepsis vitals.      Past Medical History:  Diagnosis Date  . Hyperlipidemia   . Prediabetes   . Stroke [facility](HCC)     Patient Active Problem List   Diagnosis Date Noted  . Altered mental status 06/14/2020  . Metabolic encephalopathy 06/13/2020  . CVA (cerebral vascular accident) (HCC) 06/13/2020  . Acute lower UTI 06/13/2020  . Hyperglycemia 06/13/2020    History reviewed. No pertinent surgical history.   OB History   No obstetric history on file.     Family History  Problem Relation Age of Onset  . Heart disease Neg Hx     Social History   Tobacco Use  . Smoking status: Never Smoker  . Smokeless tobacco: Never Used  Substance Use Topics  . Alcohol use: Yes    Comment: socially  . Drug use: Never    Home Medications Prior to Admission medications   Medication Sig Start Date End Date Taking? Authorizing Provider  fosinopril (MONOPRIL) 40 MG tablet Take 40 mg by mouth daily.   Yes [provider]  metFORMIN (GLUCOPHAGE) 500 MG tablet Take 1 tablet (500 mg total) by mouth 2 (two) times daily with a meal. 06/19/20 07/19/20 Yes Pahwani, Daleen Boavi, MD  metoprolol succinate (TOPROL-XL) 25 MG 24 hr tablet Take 25 mg by mouth  daily. 12/21/19  Yes [provider]  atorvastatin (LIPITOR) 80 MG tablet Take 1 tablet (80 mg total) by mouth daily at 6 PM. 06/19/20 07/19/20  Hughie ClossPahwani, Ravi, MD  FLUoxetine (PROZAC) 10 MG capsule Take 1 capsule (10 mg total) by mouth daily. 06/19/20 07/19/20  Hughie ClossPahwani, Ravi, MD    Allergies    Penicillins and Sulfa antibiotics  Review of Systems   Review of Systems  Unable to perform ROS: Acuity of condition  Constitutional: Negative for fever.  Respiratory: Negative for stridor.   Gastrointestinal: Negative for vomiting.  Skin: Negative for rash.  Neurological: Negative for seizures.    Physical Exam Updated Vital Signs BP 101/64   Pulse 90   Temp 97.9 F (36.6 C) (Oral)   Resp 18   Ht 5\' 9"  (1.753 m)   Wt 99.8 kg   SpO2 100%   BMI 32.49 kg/m   Physical Exam Constitutional:      Appearance: She is not toxic-appearing or diaphoretic.  HENT:     Head: Normocephalic and atraumatic.     Nose: Nose normal.     Mouth/Throat:     Pharynx: No posterior oropharyngeal erythema.  Eyes:     Conjunctiva/sclera: Conjunctivae normal.     Pupils: Pupils are equal, round, and reactive to light.  Neck:     Comments: Neck mass  Cardiovascular:     Rate and Rhythm: Normal rate and regular  rhythm.     Pulses: Normal pulses.     Heart sounds: Normal heart sounds.  Pulmonary:     Effort: Pulmonary effort is normal.     Breath sounds: Normal breath sounds.  Abdominal:     General: Abdomen is flat. Bowel sounds are normal.     Palpations: Abdomen is soft.     Tenderness: There is no abdominal tenderness. There is no guarding.  Musculoskeletal:        General: Normal range of motion.     Cervical back: No rigidity.     Right lower leg: No edema.     Left lower leg: No edema.  Skin:    General: Skin is warm and dry.     Capillary Refill: Capillary refill takes less than 2 seconds.  Neurological:     Deep Tendon Reflexes: Reflexes normal.     ED Results / Procedures /  Treatments   Labs (all labs ordered are listed, but only abnormal results are displayed) Results for orders placed or performed during the hospital encounter of 07/30/20  Resp Panel by RT-PCR (Flu A&B, Covid) Nasopharyngeal Swab   Specimen: Nasopharyngeal Swab; Nasopharyngeal(NP) swabs in vial transport medium  Result Value Ref Range   SARS Coronavirus 2 by RT PCR NEGATIVE NEGATIVE   Influenza A by PCR NEGATIVE NEGATIVE   Influenza B by PCR NEGATIVE NEGATIVE  Lactic acid, plasma  Result Value Ref Range   Lactic Acid, Venous 2.4 (HH) 0.5 - 1.9 mmol/L  Comprehensive metabolic panel  Result Value Ref Range   Sodium 151 (H) 135 - 145 mmol/L   Potassium 5.5 (H) 3.5 - 5.1 mmol/L   Chloride 121 (H) 98 - 111 mmol/L   CO2 13 (L) 22 - 32 mmol/L   Glucose, Bld 172 (H) 70 - 99 mg/dL   BUN 174 (H) 8 - 23 mg/dL   Creatinine, Ser 9.44 (H) 0.44 - 1.00 mg/dL   Calcium 9.8 8.9 - 96.7 mg/dL   Total Protein 6.7 6.5 - 8.1 g/dL   Albumin 3.3 (L) 3.5 - 5.0 g/dL   AST 15 15 - 41 U/L   ALT 10 0 - 44 U/L   Alkaline Phosphatase 71 38 - 126 U/L   Total Bilirubin 0.9 0.3 - 1.2 mg/dL   GFR, Estimated 12 (L) >60 mL/min   Anion gap 17 (H) 5 - 15  CBC WITH DIFFERENTIAL  Result Value Ref Range   WBC 19.8 (H) 4.0 - 10.5 K/uL   RBC 5.30 (H) 3.87 - 5.11 MIL/uL   Hemoglobin 15.1 (H) 12.0 - 15.0 g/dL   HCT 59.1 (H) 63.8 - 46.6 %   MCV 90.9 80.0 - 100.0 fL   MCH 28.5 26.0 - 34.0 pg   MCHC 31.3 30.0 - 36.0 g/dL   RDW 59.9 35.7 - 01.7 %   Platelets 247 150 - 400 K/uL   nRBC 0.0 0.0 - 0.2 %   Neutrophils Relative % 85 %   Neutro Abs 17.1 (H) 1.7 - 7.7 K/uL   Lymphocytes Relative 8 %   Lymphs Abs 1.5 0.7 - 4.0 K/uL   Monocytes Relative 6 %   Monocytes Absolute 1.1 (H) 0.1 - 1.0 K/uL   Eosinophils Relative 0 %   Eosinophils Absolute 0.0 0.0 - 0.5 K/uL   Basophils Relative 0 %   Basophils Absolute 0.0 0.0 - 0.1 K/uL   Immature Granulocytes 1 %   Abs Immature Granulocytes 0.13 (H) 0.00 - 0.07 K/uL   Protime-INR  Result Value Ref Range   Prothrombin Time 16.3 (H) 11.4 - 15.2 seconds   INR 1.4 (H) 0.8 - 1.2  APTT  Result Value Ref Range   aPTT 22 (L) 24 - 36 seconds  Urinalysis, Routine w reflex microscopic Urine, Catheterized  Result Value Ref Range   Color, Urine AMBER (A) YELLOW   APPearance TURBID (A) CLEAR   Specific Gravity, Urine 1.017 1.005 - 1.030   pH 5.0 5.0 - 8.0   Glucose, UA 50 (A) NEGATIVE mg/dL   Hgb urine dipstick MODERATE (A) NEGATIVE   Bilirubin Urine NEGATIVE NEGATIVE   Ketones, ur 5 (A) NEGATIVE mg/dL   Protein, ur 454 (A) NEGATIVE mg/dL   Nitrite NEGATIVE NEGATIVE   Leukocytes,Ua MODERATE (A) NEGATIVE   RBC / HPF 21-50 0 - 5 RBC/hpf   WBC, UA >50 (H) 0 - 5 WBC/hpf   Bacteria, UA MANY (A) NONE SEEN   Squamous Epithelial / LPF 0-5 0 - 5   Non Squamous Epithelial 0-5 (A) NONE SEEN   CT Head Wo Contrast  Result Date: 07/30/2020 CLINICAL DATA:  Delirium EXAM: CT HEAD WITHOUT CONTRAST TECHNIQUE: Contiguous axial images were obtained from the base of the skull through the vertex without intravenous contrast. COMPARISON:  06/13/2020 FINDINGS: Brain: There is an old posterior left MCA territory infarct. No acute hemorrhage. Mild generalized atrophy with old bilateral small vessel infarcts. Vascular: Atherosclerotic calcification of the internal carotid arteries at the skull base. No abnormal hyperdensity of the major intracranial arteries or dural venous sinuses. Skull: The visualized skull base, calvarium and extracranial soft tissues are normal. Sinuses/Orbits: No fluid levels or advanced mucosal thickening of the visualized paranasal sinuses. No mastoid or middle ear effusion. The orbits are normal. IMPRESSION: 1. No acute intracranial abnormality. 2. Old posterior left MCA territory infarct. Electronically Signed   By: Deatra Robinson M.D.   On: 07/30/2020 01:34   DG Chest Port 1 View  Result Date: 07/30/2020 CLINICAL DATA:  Sepsis EXAM: PORTABLE CHEST 1 VIEW  COMPARISON:  06/13/2020 FINDINGS: Lung volumes are small, but are symmetric. Bibasilar fibrotic changes are again identified. No superimposed focal pulmonary infiltrate. No pneumothorax or pleural effusion. Cardiac size is within normal limits. The pulmonary vascularity is normal. No acute bone abnormality. IMPRESSION: Stable bibasilar pulmonary fibrotic changes. No radiographic evidence of acute cardiopulmonary disease. Electronically Signed   By: Helyn Numbers MD   On: 07/30/2020 01:14    EKG EKG Interpretation  Date/Time:  Wednesday July 30 2020 01:44:13 EDT Ventricular Rate:  95 PR Interval:  115 QRS Duration: 91 QT Interval:  319 QTC Calculation: 401 R Axis:   -35 Text Interpretation: duplicate, discard Confirmed by Dione Booze ([id number]) on 07/30/2020 1:52:35 AM   Radiology CT Head Wo Contrast  Result Date: 07/30/2020 CLINICAL DATA:  Delirium EXAM: CT HEAD WITHOUT CONTRAST TECHNIQUE: Contiguous axial images were obtained from the base of the skull through the vertex without intravenous contrast. COMPARISON:  06/13/2020 FINDINGS: Brain: There is an old posterior left MCA territory infarct. No acute hemorrhage. Mild generalized atrophy with old bilateral small vessel infarcts. Vascular: Atherosclerotic calcification of the internal carotid arteries at the skull base. No abnormal hyperdensity of the major intracranial arteries or dural venous sinuses. Skull: The visualized skull base, calvarium and extracranial soft tissues are normal. Sinuses/Orbits: No fluid levels or advanced mucosal thickening of the visualized paranasal sinuses. No mastoid or middle ear effusion. The orbits are normal. IMPRESSION: 1. No acute intracranial abnormality. 2. Old posterior left MCA  territory infarct. Electronically Signed   By: Deatra Robinson M.D.   On: 07/30/2020 01:34   DG Chest Port 1 View  Result Date: 07/30/2020 CLINICAL DATA:  Sepsis EXAM: PORTABLE CHEST 1 VIEW COMPARISON:  06/13/2020 FINDINGS: Lung  volumes are small, but are symmetric. Bibasilar fibrotic changes are again identified. No superimposed focal pulmonary infiltrate. No pneumothorax or pleural effusion. Cardiac size is within normal limits. The pulmonary vascularity is normal. No acute bone abnormality. IMPRESSION: Stable bibasilar pulmonary fibrotic changes. No radiographic evidence of acute cardiopulmonary disease. Electronically Signed   By: Helyn Numbers MD   On: 07/30/2020 01:14    Procedures Procedures   Medications Ordered in ED Medications  lactated ringers infusion (0 mLs Intravenous Hold 07/30/20 0246)  lactated ringers bolus 1,000 mL (1,000 mLs Intravenous New Bag/Given 07/30/20 0235)    And  lactated ringers bolus 1,000 mL (0 mLs Intravenous Stopped 07/30/20 0235)    And  lactated ringers bolus 1,000 mL (has no administration in time range)  metroNIDAZOLE (FLAGYL) IVPB 500 mg (500 mg Intravenous New Bag/Given 07/30/20 0215)  vancomycin (VANCOREADY) IVPB 2000 mg/400 mL (has no administration in time range)  aztreonam (AZACTAM) 2 g in sodium chloride 0.9 % 100 mL IVPB (0 g Intravenous Stopped 07/30/20 0214)    ED Course  I have reviewed the triage vital signs and the nursing notes.  Pertinent labs & imaging results that were available during my care of the patient were reviewed by me and considered in my medical decision making (see chart for details).    MDM Reviewed: previous chart, nursing note and vitals Reviewed previous: labs Interpretation: labs, ECG, x-ray and CT scan (NACPD on CXR by me, no bleed by me on CT head, acute renal failure, hypernatremia, hyperkalemia, elevated white count and lactate ) Total time providing critical care: 75-105 minutes (sepsis antibiotics, hyperkalemia treatment and IVF). This excludes time spent performing separately reportable procedures and services. Consults: admitting MD  CRITICAL CARE Performed by: Aleanna Menge K Guillaume Weninger-Rasch Total critical care time: 90 minutes Critical  care time was exclusive of separately billable procedures and treating other patients. Critical care was necessary to treat or prevent imminent or life-threatening deterioration. Critical care was time spent personally by me on the following activities: development of treatment plan with patient and/or surrogate as well as nursing, discussions with consultants, evaluation of patient's response to treatment, examination of patient, obtaining history from patient or surrogate, ordering and performing treatments and interventions, ordering and review of laboratory studies, ordering and review of radiographic studies, pulse oximetry and re-evaluation of patient's condition.  Final Clinical Impression(s) / ED Diagnoses Final diagnoses:  Sepsis, due to unspecified organism, unspecified whether acute organ dysfunction present (HCC)  Hypernatremia  Acute renal failure, unspecified acute renal failure type [facility])    Rx / DC Orders ED Discharge Orders    None       Macai Sisneros, MD 07/30/20 [phone]

## 2020-07-30 NOTE — Progress Notes (Signed)
Patient's FSBS 64 on arrival to room. 12.5g D50 given via IV. Patient's FSBS post D50 is 74.

## 2020-07-30 NOTE — Consult Note (Signed)
Cardiology Consultation:   Patient ID: Kristy Jensen MRN: [id number]; DOB: Oct 29, 1950  Admit date: 07/30/2020 Date of Consult: 07/30/2020  PCP:  Patient, No Pcp Per (Inactive)   [facility]  Cardiologist:  Meriam Sprague, MD  Advanced Practice Provider:  No care team member to display Electrophysiologist:  None    Patient Profile:   Kristy Jensen is a 70 y.o. female with a hx of CVA '2019, HLD, HTN and SVT (possible Afib) who is being seen today for the evaluation of Atrial fibrillation with RVR in the setting of sepsis at the request of Dr. Toniann Fail.  History of Present Illness:   [name] is a 70 yo female with PMH noted above. Information is obtained from chart as the patient is altered. She was recently seen by cardiology while inpatient on 06/2020 for concern regarding possible atrial flutter/afib.  Notes indicate she had recently moved from [place] to [place] to be closer to her son due to declining mental status.  Son brought her to the emergency room due to worsening confusion/altered mental status.  Work-up that admission revealed UTI.  While in the ED she had a 12 beat run of nonsustained VT and reportedly went into atrial flutter with quick conversion back to sinus rhythm.  Unfortunately we were unable to confirm this as when the patient was transferred from the ED to telemetry unit the telemetry was lost.  Recommendations at the time of discharge were for 30-day event monitor.  Echocardiogram during that admission showed an EF of 55 to 60% with no regional wall motion abnormality, grade 1 diastolic dysfunction.  Outpatient monitor was ordered along with hospital follow-up scheduled for 4/1. Does not appear the outpatient monitor was ever completed.    She was brought back to the hospital on 3/30 with altered mental status and elevated heart rates.  Currently resides at [facility], [place].  They reported patient last seen normal at 1700  on 3/29.  EMS called when patient was noted to be drowsy and unable to follow commands.  Labs on admission showed sodium of 151, potassium 5.5, creatinine 3.9, albumin 3.3, lactic acid 2.4>> 3.9>> 4.1>> 3.0, troponin 77>> 115, WBC 14.4, procalcitonin 0.18, TSH 1.675.  EKG on arrival showed NSR, PAC.  CT head showed old posterior left MCA infarct.  Chest x-ray without acute edema.  She was admitted for sepsis with likely source being UTI.  Initially admitted to internal medicine but with persistent hypotension and tachycardia PCCM was consulted and care was transferred.  Given her worsening tachycardia repeat EKG was done noting atrial fibrillation with RVR.  She has placed on IV heparin along with IV beta-blockers. Review of telemetry shows initial Bigeminy on arrival leading into narrow complex tachycardia rates in the 150s, then Afib RVR rates in the 170 at times, then conversion to ST around 8:40am with freq PVCs.   Past Medical History:  Diagnosis Date  . Hyperlipidemia   . Prediabetes   . Stroke [facility])     History reviewed. No pertinent surgical history.   Home Medications:  Prior to Admission medications   Medication Sig Start Date End Date Taking? Authorizing Provider  fosinopril (MONOPRIL) 40 MG tablet Take 40 mg by mouth daily.   Yes [provider]  metFORMIN (GLUCOPHAGE) 500 MG tablet Take 1 tablet (500 mg total) by mouth 2 (two) times daily with a meal. 06/19/20 07/19/20 Yes Pahwani, Daleen Bo, MD  metoprolol succinate (TOPROL-XL) 25 MG 24 hr tablet Take  25 mg by mouth daily. 12/21/19  Yes [provider]  atorvastatin (LIPITOR) 80 MG tablet Take 1 tablet (80 mg total) by mouth daily at 6 PM. 06/19/20 07/19/20  Hughie Closs, MD  FLUoxetine (PROZAC) 10 MG capsule Take 1 capsule (10 mg total) by mouth daily. 06/19/20 07/19/20  Hughie Closs, MD    Inpatient Medications: Scheduled Meds: . insulin aspart  0-6 Units Subcutaneous Q4H  . pantoprazole (PROTONIX) IV  40 mg  Intravenous QHS  . vancomycin variable dose per unstable renal function (pharmacist dosing)   Does not apply See admin instructions   Continuous Infusions: . aztreonam Stopped (07/30/20 1448)  . heparin 1,200 Units/hr (07/30/20 0548)  . metronidazole Stopped (07/30/20 1112)  .  sodium bicarbonate (isotonic) infusion in sterile water 150 mL/hr at 07/30/20 0933   PRN Meds: acetaminophen **OR** acetaminophen, docusate sodium, metoprolol tartrate, polyethylene glycol  Allergies:    Allergies  Allergen Reactions  . Penicillins Swelling and Other (See Comments)    Throat swelling/ turned red  . Sulfa Antibiotics Swelling and Other (See Comments)    Throat swelling/turned red    Social History:   Social History   Socioeconomic History  . Marital status: Divorced    Spouse name: Not on file  . Number of children: Not on file  . Years of education: Not on file  . Highest education level: Not on file  Occupational History  . Not on file  Tobacco Use  . Smoking status: Never Smoker  . Smokeless tobacco: Never Used  Substance and Sexual Activity  . Alcohol use: Yes    Comment: socially  . Drug use: Never  . Sexual activity: Not on file  Other Topics Concern  . Not on file  Social History Narrative  . Not on file   Social Determinants of Health   Financial Resource Strain: Not on file  Food Insecurity: Not on file  Transportation Needs: Not on file  Physical Activity: Not on file  Stress: Not on file  Social Connections: Not on file  Intimate Partner Violence: Not on file    Family History:    Family History  Problem Relation Age of Onset  . Heart disease Neg Hx      ROS:  Please see the history of present illness.   All other ROS reviewed and negative.     Physical Exam/Data:   Vitals:   07/30/20 1345 07/30/20 1415 07/30/20 1445 07/30/20 1459  BP: (!) 109/58 115/62 (!) 119/54   Pulse: 83 90    Resp: 11 (!) 21 12   Temp:    97.6 F (36.4 C)  TempSrc:     Oral  SpO2: 99% 99%    Weight:      Height:        Intake/Output Summary (Last 24 hours) at 07/30/2020 1506 Last data filed at 07/30/2020 1448 Gross per 24 hour  Intake 5712.33 ml  Output --  Net 5712.33 ml   Last 3 Weights 07/30/2020 06/17/2020 06/16/2020  Weight (lbs) 220 lb 251 lb 12.3 oz 255 lb 15.3 oz  Weight (kg) 99.791 kg 114.2 kg 116.1 kg     Body mass index is 32.49 kg/m.  General:  Thin, frail older WF HEENT: normal Lymph: no adenopathy Neck: no JVD Endocrine:  No thryomegaly Vascular: No carotid bruits Cardiac:  normal S1, S2; RRR; no murmur  Lungs:  clear to auscultation bilaterally Abd: soft, nontender, no hepatomegaly  Ext: no edema Musculoskeletal:  No deformities Skin:  warm and dry  Neuro:  CNs 2-12 intact, no focal abnormalities noted Psych:  Confused, does not answer questions but opens eye to verbal stimuli  EKG:  The EKG was personally reviewed and demonstrates:  Initially NSR, follow up EKG with narrow complex--> Afib with ST depression in lateral leads, elevation in aVR Telemetry:  Telemetry was personally reviewed and demonstrates:   Review of telemetry shows initial Bigeminy on arrival leading into narrow complex tachycardia rates in the 150s, then Afib RVR rates in the 170 at times, then conversion to SR around 8:40am with freq PVCs.    Relevant CV Studies:  Echo: 06/2020  IMPRESSIONS    1. Left ventricular ejection fraction, by estimation, is 55 to 60%. The  left ventricle has normal function. The left ventricle has no regional  wall motion abnormalities. There is mild concentric left ventricular  hypertrophy. Left ventricular diastolic  parameters are consistent with Grade I diastolic dysfunction (impaired  relaxation).  2. Right ventricular systolic function is low normal. The right  ventricular size is normal. There is normal pulmonary artery systolic  pressure.  3. The mitral valve is grossly normal. No evidence of mitral valve   regurgitation.  4. The aortic valve is tricuspid. There is mild thickening of the aortic  valve. Aortic valve regurgitation is not visualized. No aortic stenosis is  present.   Comparison(s): No prior Echocardiogram.   Laboratory Data:  High Sensitivity Troponin:   Recent Labs  Lab 07/30/20 0517 07/30/20 0735  TROPONINIHS 77* 115*     Chemistry Recent Labs  Lab 07/30/20 0059 07/30/20 0517 07/30/20 1119 07/30/20 1124  NA 151* 152* 152* 152*  K 5.5* 5.4* 4.4 4.4  CL 121* 123* 119*  --   CO2 13* 11* 18*  --   GLUCOSE 172* 140* 118*  --   BUN 150* 142* 115*  --   CREATININE 3.90* 3.43* 2.64*  --   CALCIUM 9.8 8.9 8.3*  --   GFRNONAA 12* 14* 19*  --   ANIONGAP 17* 18* 15  --     Recent Labs  Lab 07/30/20 0059 07/30/20 0517  PROT 6.7 5.8*  ALBUMIN 3.3* 2.7*  AST 15 17  ALT 10 6  ALKPHOS 71 65  BILITOT 0.9 0.8   Hematology Recent Labs  Lab 07/30/20 0059 07/30/20 0517 07/30/20 1124  WBC 19.8* 14.4*  --   RBC 5.30* 4.71  --   HGB 15.1* 13.3 11.2*  HCT 48.2* 45.7 33.0*  MCV 90.9 97.0  --   MCH 28.5 28.2  --   MCHC 31.3 29.1*  --   RDW 15.2 15.4  --   PLT 247 185  --    BNPNo results for input(s): BNP, PROBNP in the last 168 hours.  DDimer No results for input(s): DDIMER in the last 168 hours.   Radiology/Studies:  CT Head Wo Contrast  Result Date: 07/30/2020 CLINICAL DATA:  Delirium EXAM: CT HEAD WITHOUT CONTRAST TECHNIQUE: Contiguous axial images were obtained from the base of the skull through the vertex without intravenous contrast. COMPARISON:  06/13/2020 FINDINGS: Brain: There is an old posterior left MCA territory infarct. No acute hemorrhage. Mild generalized atrophy with old bilateral small vessel infarcts. Vascular: Atherosclerotic calcification of the internal carotid arteries at the skull base. No abnormal hyperdensity of the major intracranial arteries or dural venous sinuses. Skull: The visualized skull base, calvarium and extracranial soft  tissues are normal. Sinuses/Orbits: No fluid levels or advanced mucosal thickening of the visualized paranasal  sinuses. No mastoid or middle ear effusion. The orbits are normal. IMPRESSION: 1. No acute intracranial abnormality. 2. Old posterior left MCA territory infarct. Electronically Signed   By: Deatra Robinson M.D.   On: 07/30/2020 01:34   DG Chest Port 1 View  Result Date: 07/30/2020 CLINICAL DATA:  Sepsis EXAM: PORTABLE CHEST 1 VIEW COMPARISON:  06/13/2020 FINDINGS: Lung volumes are small, but are symmetric. Bibasilar fibrotic changes are again identified. No superimposed focal pulmonary infiltrate. No pneumothorax or pleural effusion. Cardiac size is within normal limits. The pulmonary vascularity is normal. No acute bone abnormality. IMPRESSION: Stable bibasilar pulmonary fibrotic changes. No radiographic evidence of acute cardiopulmonary disease. Electronically Signed   By: Helyn Numbers MD   On: 07/30/2020 01:14   CT RENAL STONE STUDY  Result Date: 07/30/2020 CLINICAL DATA:  Flank pain, sepsis, urinary tract infection EXAM: CT ABDOMEN AND PELVIS WITHOUT CONTRAST TECHNIQUE: Multidetector CT imaging of the abdomen and pelvis was performed following the standard protocol without IV contrast. COMPARISON:  None. FINDINGS: Lower chest: Bibasilar pulmonary infiltrates are present, not well assessed on this examination. Mild coronary artery calcification. Global cardiac size within normal limits. No pericardial effusion. Hepatobiliary: No focal liver abnormality is seen. Status post cholecystectomy. No biliary dilatation. Pancreas: Unremarkable Spleen: Unremarkable Adrenals/Urinary Tract: Adrenal glands are unremarkable. Kidneys are normal, without renal calculi, focal lesion, or hydronephrosis. Bladder is unremarkable. Stomach/Bowel: Moderate descending and sigmoid colonic diverticulosis. The stomach, small bowel, and large bowel are otherwise unremarkable. Appendix normal. No free intraperitoneal gas or  fluid. Vascular/Lymphatic: Moderate aortoiliac atherosclerotic calcification. No aortic aneurysm. No pathologic adenopathy within the abdomen and pelvis. Reproductive: Uterus and bilateral adnexa are unremarkable. Other: Tiny fat containing umbilical hernia.  Rectum unremarkable. Musculoskeletal: No acute bone abnormality. Degenerative changes are seen within the lumbar spine. No lytic or blastic bone lesions. IMPRESSION: Bibasilar pulmonary infiltrates, not well assessed on this examination. While associated with volume loss, these do not have the typical appearance of pulmonary fibrosis and additional considerations should include interstitial lung disease, as can be seen with connective tissue disorders or drug reaction. No acute intra-abdominal pathology. No definite radiographic explanation for the patient's reported symptoms. Moderate distal colonic diverticulosis. Aortic Atherosclerosis (ICD10-I70.0). Electronically Signed   By: Helyn Numbers MD   On: 07/30/2020 06:14     Assessment and Plan:   Bridgett Hattabaugh is a 70 y.o. female with a hx of CVA '2019, HLD, HTN and SVT (possible Afib) who is being seen today for the evaluation of Atrial fibrillation with RVR in the setting of sepsis at the request of Dr. Octavio Graves.  1.  Atrial fibrillation with RVR: Not clear during previous admission. Plan was for outpatient monitor. Initially in NSR on admission, but then developed bigeminy with conversion to Afib RVR then back to SR with PVCs. Treated with IV metoprolol x2. TSH WNL -- remains on IV heparin, IV metoprolol PRN q3hr -- currently in SR, bigeminy at times -- CHA2DS2-VASc Score of at least 7, currently NPO therefore continue with IV heparin for now  2.  Septic shock: Likely secondary to UTI but exacerbated by atrial fibrillation with RVR.  Initially admitted to internal medicine, but care transferred to PCCM with persistent hypotension and tachycardia. --Noted to be in acute renal failure with  hyperkalemia on admission --Elevated lactic acid, procalcitonin 0.18 --Antibiotics per primary  3.  Altered mental status: History of CVA --CT head shows no acute abnormality with old left MCA infarct  4.  Acute renal failure: On lisinopril  prior to admission -- Cr 3.9>> 3.4>>2.64 the setting of acute sepsis/shock -- Initiated on sodium bicarb drip on admission -- Aggressive IV fluids, avoiding nephrotoxic agents  5. Hyperkalemia: K+ 5.5>>4.4   Risk Assessment/Risk Scores:   CHA2DS2-VASc Score = 5      :[id number]}This indicates a 7.2% annual risk of stroke. The patient's score is based upon: CHF History: No HTN History: Yes Diabetes History: No Stroke History: Yes Vascular Disease History: No Age Score: 1 Gender Score: 1   For questions or updates, please contact [facility] Please consult [email or web address] for contact info under    Signed, Laverda PageLindsay Marissia Blackham, NP  07/30/2020 3:06 PM

## 2020-07-30 NOTE — Sepsis Progress Note (Signed)
Following for sepsis monitoring ?

## 2020-07-30 NOTE — Progress Notes (Addendum)
ANTICOAGULATION CONSULT NOTE - Follow Up Consult  Pharmacy Consult for IV Heparin Indication: atrial fibrillation  Allergies  Allergen Reactions  . Penicillins Swelling and Other (See Comments)    Throat swelling/ turned red  . Sulfa Antibiotics Swelling and Other (See Comments)    Throat swelling/turned red    Patient Measurements: Height: 5\' 9"  (175.3 cm) Weight: 99.8 kg (220 lb) IBW/kg (Calculated) : 66.2 Heparin Dosing Weight: 87 kg   Vital Signs: Temp: 97.2 F (36.2 C) (03/30 1526) Temp Source: Axillary (03/30 1526) BP: 136/61 (03/30 1600) Pulse Rate: 99 (03/30 1600)  Labs: Recent Labs    07/30/20 0059 07/30/20 0517 07/30/20 0735 07/30/20 1119 07/30/20 1124 07/30/20 1642  HGB 15.1* 13.3  --   --  11.2*  --   HCT 48.2* 45.7  --   --  33.0*  --   PLT 247 185  --   --   --   --   APTT 22*  --   --   --   --   --   LABPROT 16.3*  --   --   --   --   --   INR 1.4*  --   --   --   --   --   HEPARINUNFRC  --   --   --   --   --  0.49  CREATININE 3.90* 3.43*  --  2.64*  --   --   TROPONINIHS  --  77* 115*  --   --   --     Estimated Creatinine Clearance: 25.3 mL/min (A) (by C-G formula based on SCr of 2.64 mg/dL (H)).   Medical History: Past Medical History:  Diagnosis Date  . Hyperlipidemia   . Prediabetes   . Stroke [facility])     Assessment: 70 yr old female presented with severe sepsis and was found to be in atrial fibrillation with RVR. Pharmacy was consulted to dose IV heparin (pt was on no anticoagulants PTA).   Heparin level 3/31 within goal, approaching upper limit of normal at 0.65, with 1200 units/hr. Hgb 11.4, remains stable, plt 159. Per RN, no issues with IV or bleeding observed and heparin level drawn appropriately.  Goal of Therapy:  Heparin level 0.3-0.7 units/ml Monitor platelets by anticoagulation protocol: Yes   Plan:  Decrease heparin to 1150 units/hr Monitor daily heparin levels  Monitor signs of bleeding, Hgb, Plts F/U transition to  oral anticoagulant  4/31  PharmD Candidate, Class of 2022   _________________________________________  Addendum: modified 3/30 PM heparin note in error.   4/30  PharmD Candidate, Class of 2022

## 2020-07-30 NOTE — ED Notes (Signed)
Critical Lab results reported to Nurse [facility].

## 2020-07-30 NOTE — Progress Notes (Addendum)
Patient arrived to room 3M03, ICU.   Belongings: watch and necklace removed and placed in cup with patient label on bedside table in room.

## 2020-07-30 NOTE — Plan of Care (Signed)
2nd attempt to call son [name]- left another message.  Steffanie Dunn, DO 07/30/20 4:37 PM [facility]

## 2020-07-30 NOTE — ED Notes (Signed)
Report given to Madison F, RN   

## 2020-07-30 NOTE — ED Notes (Signed)
Oxygen 100%

## 2020-07-30 NOTE — Progress Notes (Signed)
Pharmacy Antibiotic Note  Kristy Jensen is a 70 y.o. female admitted on 07/30/2020 with sepsis.  Pharmacy has been consulted for Vancomycin and Aztreonam dosing. Pt with allergy (tongue swelling) to PCN and no h/o receiving cephalosporins in the past.  AKI with SCr up to 3.9 (was 0.9 ~68mos ago)  Plan: Aztreonam 2gm now then 1gm IV q8h Vancomycin 2000mg  IV now. F/u renal function for further doses Will f/u micro data, and pt's clinical condition Vanc levels prn   Height: 5\' 9"  (175.3 cm) Weight: 99.8 kg (220 lb) IBW/kg (Calculated) : 66.2  Temp (24hrs), Avg:97.9 F (36.6 C), Min:97.9 F (36.6 C), Max:97.9 F (36.6 C)  Recent Labs  Lab 07/30/20 0059 07/30/20 0127  WBC 19.8*  --   CREATININE 3.90*  --   LATICACIDVEN  --  2.4*    Estimated Creatinine Clearance: 17.1 mL/min (A) (by C-G formula based on SCr of 3.9 mg/dL (H)).    Allergies  Allergen Reactions  . Penicillins Swelling and Other (See Comments)    Throat swelling/ turned red  . Sulfa Antibiotics Swelling and Other (See Comments)    Throat swelling/turned red    Antimicrobials this admission: 3/30 Vanc >>  3/30 Aztreonam >>   Microbiology results: 3/30 BCx:  3/30 UCx:    Thank you for allowing pharmacy to be a part of this patient's care.  4/30, PharmD, BCPS Please see amion for complete clinical pharmacist phone list 07/30/2020 3:25 AM

## 2020-07-30 NOTE — H&P (Signed)
History and Physical    [place] Jensen ZOX:[id number] DOB: 06-Sep-1950 DOA: 07/30/2020  PCP: Patient, No Pcp Per (Inactive)  Patient coming from: Skilled nursing facility.  Chief Complaint: Altered mental status.  HPI: Kristy GandyVirginia Jensen is a 70 y.o. female with history of CVA, hypertension, hyperglycemia, tachycardia likely from an SVT versus atrial flutter admitted last month for stroke at that time patient also was found to be encephalopathic likely from stroke and UTI had brief runs of tachycardia cardiology was consulted at that time concern was for an SVT versus atrial flutter was brought to the ER after patient was found to have a change in mental status.  EMS on arrival found patient to be hypotensive.  No further history is available at this time as patient is confused and I was not able to reach patient's son.  ED Course: In the ER patient was initially hypotensive was given fluid bolus blood cultures urine cultures obtained started on empiric antibiotics for sepsis likely from UTI since UA is concerning for UTI.  CT head was unremarkable.  On my exam patient is still encephalopathic.  Labs show acute renal failure with creatinine worsening from June 19, 2020 last month was normal at 0.8 is around 3.9.  Sodium 145 potassium 5.5 lactic acid was 2.4 worsened to 3.9 further fluid bolus have been ordered WBC count 19.8.  In the ER patient went into A. fib with RVR.  I am ordering metoprolol IV since patient blood pressure has improved at this time with fluids.  Covid test is negative chest x-ray shows chronic changes.  Review of Systems: As per HPI, rest all negative.   Past Medical History:  Diagnosis Date  . Hyperlipidemia   . Prediabetes   . Stroke [facility](HCC)     History reviewed. No pertinent surgical history.   reports that she has never smoked. She has never used smokeless tobacco. She reports current alcohol use. She reports that she does not use drugs.  Allergies  Allergen  Reactions  . Penicillins Swelling and Other (See Comments)    Throat swelling/ turned red  . Sulfa Antibiotics Swelling and Other (See Comments)    Throat swelling/turned red    Family History  Problem Relation Age of Onset  . Heart disease Neg Hx     Prior to Admission medications   Medication Sig Start Date End Date Taking? Authorizing Provider  fosinopril (MONOPRIL) 40 MG tablet Take 40 mg by mouth daily.   Yes [provider]  metFORMIN (GLUCOPHAGE) 500 MG tablet Take 1 tablet (500 mg total) by mouth 2 (two) times daily with a meal. 06/19/20 07/19/20 Yes Pahwani, Daleen Boavi, MD  metoprolol succinate (TOPROL-XL) 25 MG 24 hr tablet Take 25 mg by mouth daily. 12/21/19  Yes [provider]  atorvastatin (LIPITOR) 80 MG tablet Take 1 tablet (80 mg total) by mouth daily at 6 PM. 06/19/20 07/19/20  Hughie ClossPahwani, Ravi, MD  FLUoxetine (PROZAC) 10 MG capsule Take 1 capsule (10 mg total) by mouth daily. 06/19/20 07/19/20  Hughie ClossPahwani, Ravi, MD    Physical Exam: Constitutional: Moderately built and nourished. Vitals:   07/30/20 0200 07/30/20 0215 07/30/20 0230 07/30/20 0245  BP: 104/70 108/67 108/61 101/64  Pulse: (!) 32   90  Resp: 16 18 14 18   Temp:      TempSrc:      SpO2: (!) 69%  97% 100%  Weight:      Height:       Eyes: Anicteric no pallor. ENMT: No  discharge from the ears eyes nose or mouth. Neck: No mass felt.  No neck rigidity. Respiratory: No rhonchi or crepitations. Cardiovascular: S1-S2 heard. Abdomen: Soft nontender bowel sounds present. Musculoskeletal: No edema. Skin: No rash. Neurologic: Alert awake but confused does not follow commands moving all extremities pupils are reacting to light. Psychiatric: Appears confused.   Labs on Admission: I have personally reviewed following labs and imaging studies  CBC: Recent Labs  Lab 07/30/20 0059  WBC 19.8*  NEUTROABS 17.1*  HGB 15.1*  HCT 48.2*  MCV 90.9  PLT 247   Basic Metabolic Panel: Recent Labs  Lab  07/30/20 0059  NA 151*  K 5.5*  CL 121*  CO2 13*  GLUCOSE 172*  BUN 150*  CREATININE 3.90*  CALCIUM 9.8   GFR: Estimated Creatinine Clearance: 17.1 mL/min (A) (by C-G formula based on SCr of 3.9 mg/dL (H)). Liver Function Tests: Recent Labs  Lab 07/30/20 0059  AST 15  ALT 10  ALKPHOS 71  BILITOT 0.9  PROT 6.7  ALBUMIN 3.3*   No results for input(s): LIPASE, AMYLASE in the last 168 hours. No results for input(s): AMMONIA in the last 168 hours. Coagulation Profile: Recent Labs  Lab 07/30/20 0059  INR 1.4*   Cardiac Enzymes: No results for input(s): CKTOTAL, CKMB, CKMBINDEX, TROPONINI in the last 168 hours. BNP (last 3 results) No results for input(s): PROBNP in the last 8760 hours. HbA1C: No results for input(s): HGBA1C in the last 72 hours. CBG: No results for input(s): GLUCAP in the last 168 hours. Lipid Profile: No results for input(s): CHOL, HDL, LDLCALC, TRIG, CHOLHDL, LDLDIRECT in the last 72 hours. Thyroid Function Tests: No results for input(s): TSH, T4TOTAL, FREET4, T3FREE, THYROIDAB in the last 72 hours. Anemia Panel: No results for input(s): VITAMINB12, FOLATE, FERRITIN, TIBC, IRON, RETICCTPCT in the last 72 hours. Urine analysis:    Component Value Date/Time   COLORURINE AMBER (A) 07/30/2020 0214   APPEARANCEUR TURBID (A) 07/30/2020 0214   LABSPEC 1.017 07/30/2020 0214   PHURINE 5.0 07/30/2020 0214   GLUCOSEU 50 (A) 07/30/2020 0214   HGBUR MODERATE (A) 07/30/2020 0214   BILIRUBINUR NEGATIVE 07/30/2020 0214   KETONESUR 5 (A) 07/30/2020 0214   PROTEINUR 100 (A) 07/30/2020 0214   NITRITE NEGATIVE 07/30/2020 0214   LEUKOCYTESUR MODERATE (A) 07/30/2020 0214   Sepsis Labs: @LABRCNTIP (procalcitonin:4,lacticidven:4) ) Recent Results (from the past 240 hour(s))  Resp Panel by RT-PCR (Flu A&B, Covid) Nasopharyngeal Swab     Status: None   Collection Time: 07/30/20  1:45 AM   Specimen: Nasopharyngeal Swab; Nasopharyngeal(NP) swabs in vial transport  medium  Result Value Ref Range Status   SARS Coronavirus 2 by RT PCR NEGATIVE NEGATIVE Final    Comment: (NOTE) SARS-CoV-2 target nucleic acids are NOT DETECTED.  The SARS-CoV-2 RNA is generally detectable in upper respiratory specimens during the acute phase of infection. The lowest concentration of SARS-CoV-2 viral copies this assay can detect is 138 copies/mL. A negative result does not preclude SARS-Cov-2 infection and should not be used as the sole basis for treatment or other patient management decisions. A negative result may occur with  improper specimen collection/handling, submission of specimen other than nasopharyngeal swab, presence of viral mutation(s) within the areas targeted by this assay, and inadequate number of viral copies(<138 copies/mL). A negative result must be combined with clinical observations, patient history, and epidemiological information. The expected result is Negative.  Fact Sheet for Patients:  08/01/20  Fact Sheet for Healthcare Providers:  BloggerCourse.com  This test is no t yet approved or cleared by the [place] and  has been authorized for detection and/or diagnosis of SARS-CoV-2 by FDA under an Emergency Use Authorization (EUA). This EUA will remain  in effect (meaning this test can be used) for the duration of the COVID-19 declaration under Section 564(b)(1) of the Act, 21 U.S.C.section 360bbb-3(b)(1), unless the authorization is terminated  or revoked sooner.       Influenza A by PCR NEGATIVE NEGATIVE Final   Influenza B by PCR NEGATIVE NEGATIVE Final    Comment: (NOTE) The Xpert Xpress SARS-CoV-2/FLU/RSV plus assay is intended as an aid in the diagnosis of influenza from Nasopharyngeal swab specimens and should not be used as a sole basis for treatment. Nasal washings and aspirates are unacceptable for Xpert Xpress SARS-CoV-2/FLU/RSV testing.  Fact Sheet for  Patients: BloggerCourse.com  Fact Sheet for Healthcare Providers: SeriousBroker.it  This test is not yet approved or cleared by the [place] FDA and has been authorized for detection and/or diagnosis of SARS-CoV-2 by FDA under an Emergency Use Authorization (EUA). This EUA will remain in effect (meaning this test can be used) for the duration of the COVID-19 declaration under Section 564(b)(1) of the Act, 21 U.S.C. section 360bbb-3(b)(1), unless the authorization is terminated or revoked.  Performed at [facility], [address], [place], [place] [address]      Radiological Exams on Admission: CT Head Wo Contrast  Result Date: 07/30/2020 CLINICAL DATA:  Delirium EXAM: CT HEAD WITHOUT CONTRAST TECHNIQUE: Contiguous axial images were obtained from the base of the skull through the vertex without intravenous contrast. COMPARISON:  06/13/2020 FINDINGS: Brain: There is an old posterior left MCA territory infarct. No acute hemorrhage. Mild generalized atrophy with old bilateral small vessel infarcts. Vascular: Atherosclerotic calcification of the internal carotid arteries at the skull base. No abnormal hyperdensity of the major intracranial arteries or dural venous sinuses. Skull: The visualized skull base, calvarium and extracranial soft tissues are normal. Sinuses/Orbits: No fluid levels or advanced mucosal thickening of the visualized paranasal sinuses. No mastoid or middle ear effusion. The orbits are normal. IMPRESSION: 1. No acute intracranial abnormality. 2. Old posterior left MCA territory infarct. Electronically Signed   By: Deatra Robinson M.D.   On: 07/30/2020 01:34   DG Chest Port 1 View  Result Date: 07/30/2020 CLINICAL DATA:  Sepsis EXAM: PORTABLE CHEST 1 VIEW COMPARISON:  06/13/2020 FINDINGS: Lung volumes are small, but are symmetric. Bibasilar fibrotic changes are again identified. No superimposed focal pulmonary  infiltrate. No pneumothorax or pleural effusion. Cardiac size is within normal limits. The pulmonary vascularity is normal. No acute bone abnormality. IMPRESSION: Stable bibasilar pulmonary fibrotic changes. No radiographic evidence of acute cardiopulmonary disease. Electronically Signed   By: Helyn Numbers MD   On: 07/30/2020 01:14    EKG: Independently reviewed.  A. fib with RVR.  Assessment/Plan Principal Problem:   Sepsis (HCC) Active Problems:   Metabolic encephalopathy   CVA (cerebral vascular accident) (HCC)   Acute lower UTI   ARF (acute renal failure) (HCC)    1. Sepsis likely source could be urinary tract infection.  We will continue with hydration follow cultures follow lactic acid levels. 2. Acute metabolic encephalopathy could be likely from sepsis also from acute renal failure hypernatremia.  We will keep patient n.p.o. for now get swallow evaluation will check MRI brain.  Continue with hydration follow metabolic panel. 3. Tachycardia likely from A. fib with RVR -since blood pressure has improved  with fluids I have ordered 1 dose of IV metoprolol.  Will consult cardiology.  Check TSH cardiac markers will start patient on heparin and await further evaluation and recommendations from cardiology. 4. Recent CVA presently encephalopathic.  We will keep patient n.p.o. presently on heparin patient.  Check MRI brain. 5. Acute renal failure with hyper natremia -not sure if patient has been eating well.  Will need to get further history when patient's son is available.  I try to contact patient's son but was not able to be reached.  Presently receiving fluids.  Will hold off lisinopril.  Follow intake output metabolic panel will check CT renal study to make sure there is no obstruction. 6. Hypertension initially was hypotensive at presentation.  Closely follow blood pressure trends. 7. Hyperglycemia with recent hemoglobin A1c last month was around 6.  Presently on sliding scale coverage.   Hold Metformin.  Since patient has septic picture on presentation will need close monitoring for any further worsening in inpatient status.   DVT prophylaxis: Heparin infusion. Code Status: Full code. Family Communication: Try to reach patient's son unable to reach. Disposition Plan: Back to facility when stable. Consults called: Cardiology. Admission status: Inpatient.   Eduard Clos MD [facility] Pager [phone].  If 7PM-7AM, please contact night-coverage [email or web address] Password [email or web address]  07/30/2020, 4:09 AM

## 2020-07-30 NOTE — ED Triage Notes (Signed)
Patient to ED via EMS for complaints of AMS from [facility]. Per staff patient is normally able to converse, last seen normal at 1700. States patient is drowsy, unable to follow commands, and unable to speak. Hx of previous CVA, unsure of residual deficits.

## 2020-07-31 ENCOUNTER — Encounter (HOSPITAL_COMMUNITY): Payer: Self-pay | Admitting: Critical Care Medicine

## 2020-07-31 ENCOUNTER — Inpatient Hospital Stay: Payer: Medicare HMO | Admitting: Adult Health

## 2020-07-31 DIAGNOSIS — N170 Acute kidney failure with tubular necrosis: Secondary | ICD-10-CM

## 2020-07-31 DIAGNOSIS — I4891 Unspecified atrial fibrillation: Secondary | ICD-10-CM

## 2020-07-31 DIAGNOSIS — N390 Urinary tract infection, site not specified: Secondary | ICD-10-CM | POA: Diagnosis not present

## 2020-07-31 DIAGNOSIS — G9341 Metabolic encephalopathy: Secondary | ICD-10-CM

## 2020-07-31 DIAGNOSIS — N179 Acute kidney failure, unspecified: Secondary | ICD-10-CM | POA: Diagnosis not present

## 2020-07-31 DIAGNOSIS — R579 Shock, unspecified: Secondary | ICD-10-CM | POA: Diagnosis not present

## 2020-07-31 DIAGNOSIS — I6300 Cerebral infarction due to thrombosis of unspecified precerebral artery: Secondary | ICD-10-CM | POA: Diagnosis not present

## 2020-07-31 DIAGNOSIS — A419 Sepsis, unspecified organism: Secondary | ICD-10-CM | POA: Diagnosis not present

## 2020-07-31 LAB — RAPID URINE DRUG SCREEN, HOSP PERFORMED
Amphetamines: NOT DETECTED
Barbiturates: NOT DETECTED
Benzodiazepines: NOT DETECTED
Cocaine: NOT DETECTED
Opiates: NOT DETECTED
Tetrahydrocannabinol: NOT DETECTED

## 2020-07-31 LAB — BASIC METABOLIC PANEL
Anion gap: 8 (ref 5–15)
Anion gap: 11 (ref 5–15)
BUN: 85 mg/dL — ABNORMAL HIGH (ref 8–23)
BUN: 71 mg/dL — ABNORMAL HIGH (ref 8–23)
CO2: 26 mmol/L (ref 22–32)
CO2: 23 mmol/L (ref 22–32)
Calcium: 8.1 mg/dL — ABNORMAL LOW (ref 8.9–10.3)
Calcium: 8.2 mg/dL — ABNORMAL LOW (ref 8.9–10.3)
Chloride: 118 mmol/L — ABNORMAL HIGH (ref 98–111)
Chloride: 118 mmol/L — ABNORMAL HIGH (ref 98–111)
Creatinine, Ser: 1.64 mg/dL — ABNORMAL HIGH (ref 0.44–1.00)
Creatinine, Ser: 1.49 mg/dL — ABNORMAL HIGH (ref 0.44–1.00)
GFR, Estimated: 34 mL/min — ABNORMAL LOW (ref >60)
GFR, Estimated: 38 mL/min — ABNORMAL LOW (ref >60)
Glucose, Bld: 153 mg/dL — ABNORMAL HIGH (ref 70–99)
Glucose, Bld: 154 mg/dL — ABNORMAL HIGH (ref 70–99)
Potassium: 3.5 mmol/L (ref 3.5–5.1)
Potassium: 3.8 mmol/L (ref 3.5–5.1)
Sodium: 152 mmol/L — ABNORMAL HIGH (ref 135–145)
Sodium: 152 mmol/L — ABNORMAL HIGH (ref 135–145)

## 2020-07-31 LAB — GLUCOSE, CAPILLARY
Glucose-Capillary: 118 mg/dL — ABNORMAL HIGH (ref 70–99)
Glucose-Capillary: 120 mg/dL — ABNORMAL HIGH (ref 70–99)
Glucose-Capillary: 115 mg/dL — ABNORMAL HIGH (ref 70–99)
Glucose-Capillary: 138 mg/dL — ABNORMAL HIGH (ref 70–99)
Glucose-Capillary: 126 mg/dL — ABNORMAL HIGH (ref 70–99)
Glucose-Capillary: 118 mg/dL — ABNORMAL HIGH (ref 70–99)

## 2020-07-31 LAB — TROPONIN I (HIGH SENSITIVITY): Troponin I (High Sensitivity): 149 ng/L (ref <18)

## 2020-07-31 LAB — CBC
HCT: 36.2 % (ref 36.0–46.0)
Hemoglobin: 11.4 g/dL — ABNORMAL LOW (ref 12.0–15.0)
MCH: 28.4 pg (ref 26.0–34.0)
MCHC: 31.5 g/dL (ref 30.0–36.0)
MCV: 90 fL (ref 80.0–100.0)
Platelets: 159 10*3/uL (ref 150–400)
RBC: 4.02 MIL/uL (ref 3.87–5.11)
RDW: 15.1 % (ref 11.5–15.5)
WBC: 9.6 10*3/uL (ref 4.0–10.5)
nRBC: 0 % (ref 0.0–0.2)

## 2020-07-31 LAB — URINE CULTURE: Culture: <10000 — AB

## 2020-07-31 LAB — MAGNESIUM
Magnesium: 2 mg/dL (ref 1.7–2.4)
Magnesium: 1.9 mg/dL (ref 1.7–2.4)

## 2020-07-31 LAB — PHOSPHORUS
Phosphorus: 2.2 mg/dL — ABNORMAL LOW (ref 2.5–4.6)
Phosphorus: 2.9 mg/dL (ref 2.5–4.6)

## 2020-07-31 LAB — LIPASE, BLOOD: Lipase: 69 U/L — ABNORMAL HIGH (ref 11–51)

## 2020-07-31 LAB — LACTIC ACID, PLASMA: Lactic Acid, Venous: 2.1 mmol/L (ref 0.5–1.9)

## 2020-07-31 LAB — HEPARIN LEVEL (UNFRACTIONATED): Heparin Unfractionated: 0.65 IU/mL (ref 0.30–0.70)

## 2020-07-31 MED ORDER — OSMOLITE 1.2 CAL PO LIQD
1000.0000 mL | ORAL | Status: DC
  Administered 2020-08-01 – 2020-08-03 (×4): 1000 mL
  Filled 2020-07-31 (×8): qty 1000

## 2020-07-31 MED ORDER — DEXTROSE 5 % IV SOLN: INTRAVENOUS | Status: DC

## 2020-07-31 MED ORDER — POTASSIUM PHOSPHATES 15 MMOLE/5ML IV SOLN
15.0000 mmol | Freq: Once | INTRAVENOUS | Status: AC
  Administered 2020-07-31: 15 mmol via INTRAVENOUS
  Filled 2020-07-31: qty 5

## 2020-07-31 MED ORDER — CHLORHEXIDINE GLUCONATE 0.12 % MT SOLN
15.0000 mL | Freq: Two times a day (BID) | OROMUCOSAL | Status: DC
  Administered 2020-07-31 – 2020-08-06 (×12): 15 mL via OROMUCOSAL
  Filled 2020-07-31 (×8): qty 15

## 2020-07-31 MED ORDER — MAGNESIUM SULFATE IN D5W 1-5 GM/100ML-% IV SOLN
1.0000 g | Freq: Once | INTRAVENOUS | Status: AC
  Administered 2020-08-01: 1 g via INTRAVENOUS
  Filled 2020-07-31: qty 100

## 2020-07-31 MED ORDER — VANCOMYCIN HCL 750 MG/150ML IV SOLN
750.0000 mg | INTRAVENOUS | Status: DC
  Filled 2020-07-31: qty 150

## 2020-07-31 MED ORDER — ORAL CARE MOUTH RINSE
15.0000 mL | Freq: Two times a day (BID) | OROMUCOSAL | Status: DC
  Administered 2020-07-31 – 2020-08-06 (×13): 15 mL via OROMUCOSAL

## 2020-07-31 MED ORDER — POTASSIUM CHLORIDE 10 MEQ/100ML IV SOLN
10.0000 meq | INTRAVENOUS | Status: AC
  Administered 2020-07-31 (×2): 10 meq via INTRAVENOUS
  Filled 2020-07-31 (×2): qty 100

## 2020-07-31 NOTE — Progress Notes (Signed)
Progress Note  Patient Name: Kristy GandyVirginia Jensen Date of Encounter: 07/31/2020  [facility] Cardiologist: Meriam SpragueHeather E Pemberton, MD   Subjective   Very lethargic and cannot carry on a conversation.  Remains in NSR with PACs  Inpatient Medications    Scheduled Meds: . Chlorhexidine Gluconate Cloth  6 each Topical Daily  . insulin aspart  0-6 Units Subcutaneous Q4H   Continuous Infusions: . sodium chloride 10 mL/hr at 07/31/20 0800  . aztreonam Stopped (07/31/20 0626)  . dextrose 125 mL/hr at 07/31/20 0833  . heparin 1,200 Units/hr (07/31/20 0800)  . potassium chloride 10 mEq (07/31/20 0832)  . potassium PHOSPHATE IVPB (in mmol)     PRN Meds: sodium chloride, acetaminophen **OR** acetaminophen, docusate sodium, polyethylene glycol   Vital Signs    Vitals:   07/31/20 0400 07/31/20 0500 07/31/20 0600 07/31/20 0745  BP: 122/60 (!) 134/58 124/61   Pulse: 75 75 75   Resp: (!) 9 (!) 9 11   Temp:    98.3 F (36.8 C)  TempSrc:    Axillary  SpO2: 100% 100% 100%   Weight:  96 kg    Height:        Intake/Output Summary (Last 24 hours) at 07/31/2020 0848 Last data filed at 07/31/2020 0800 Gross per 24 hour  Intake 4937.96 ml  Output 640 ml  Net 4297.96 ml   Last 3 Weights 07/31/2020 07/30/2020 06/17/2020  Weight (lbs) 211 lb 10.3 oz 220 lb 251 lb 12.3 oz  Weight (kg) 96 kg 99.791 kg 114.2 kg      Telemetry    NSR with PACs - Personally Reviewed  ECG    No new EKG to review - Personally Reviewed  Physical Exam   GEN: Frail and ill appearing Neck: No JVD Cardiac: RRR, no murmurs, rubs, or gallops.  Respiratory: Clear to auscultation bilaterally. GI: Soft, nontender, non-distended  MS: No edema; No deformity. Neuro: cannot assess Psych: cannot assess  Labs    High Sensitivity Troponin:   Recent Labs  Lab 07/30/20 0517 07/30/20 0735 07/30/20 2151  TROPONINIHS 77* 115* 202*      Chemistry Recent Labs  Lab 07/30/20 0059 07/30/20 0517 07/30/20 1119  07/30/20 1124 07/30/20 2151 07/31/20 0354  NA 151* 152* 152* 152* 150* 152*  K 5.5* 5.4* 4.4 4.4 3.3* 3.5  CL 121* 123* 119*  --  116* 118*  CO2 13* 11* 18*  --  26 26  GLUCOSE 172* 140* 118*  --  169* 153*  BUN 150* 142* 115*  --  96* 85*  CREATININE 3.90* 3.43* 2.64*  --  1.88* 1.64*  CALCIUM 9.8 8.9 8.3*  --  7.8* 8.1*  PROT 6.7 5.8*  --   --   --   --   ALBUMIN 3.3* 2.7*  --   --   --   --   AST 15 17  --   --   --   --   ALT 10 6  --   --   --   --   ALKPHOS 71 65  --   --   --   --   BILITOT 0.9 0.8  --   --   --   --   GFRNONAA 12* 14* 19*  --  29* 34*  ANIONGAP 17* 18* 15  --  8 8     Hematology Recent Labs  Lab 07/30/20 0059 07/30/20 0517 07/30/20 1124 07/31/20 0354  WBC 19.8* 14.4*  --  9.6  RBC 5.30* 4.71  --  4.02  HGB 15.1* 13.3 11.2* 11.4*  HCT 48.2* 45.7 33.0* 36.2  MCV 90.9 97.0  --  90.0  MCH 28.5 28.2  --  28.4  MCHC 31.3 29.1*  --  31.5  RDW 15.2 15.4  --  15.1  PLT 247 185  --  159    BNPNo results for input(s): BNP, PROBNP in the last 168 hours.   DDimer No results for input(s): DDIMER in the last 168 hours.   CHA2DS2-VASc Score = 6  This indicates a 9.7% annual risk of stroke. The patient's score is based upon: CHF History: No HTN History: Yes Diabetes History: Yes Stroke History: Yes Vascular Disease History: No Age Score: 1 Gender Score: 1     Radiology    CT Head Wo Contrast  Result Date: 07/30/2020 CLINICAL DATA:  Delirium EXAM: CT HEAD WITHOUT CONTRAST TECHNIQUE: Contiguous axial images were obtained from the base of the skull through the vertex without intravenous contrast. COMPARISON:  06/13/2020 FINDINGS: Brain: There is an old posterior left MCA territory infarct. No acute hemorrhage. Mild generalized atrophy with old bilateral small vessel infarcts. Vascular: Atherosclerotic calcification of the internal carotid arteries at the skull base. No abnormal hyperdensity of the major intracranial arteries or dural venous sinuses.  Skull: The visualized skull base, calvarium and extracranial soft tissues are normal. Sinuses/Orbits: No fluid levels or advanced mucosal thickening of the visualized paranasal sinuses. No mastoid or middle ear effusion. The orbits are normal. IMPRESSION: 1. No acute intracranial abnormality. 2. Old posterior left MCA territory infarct. Electronically Signed   By: Deatra Robinson M.D.   On: 07/30/2020 01:34   MR BRAIN WO CONTRAST  Result Date: 07/30/2020 CLINICAL DATA:  Encephalopathy EXAM: MRI HEAD WITHOUT CONTRAST TECHNIQUE: Multiplanar, multiecho pulse sequences of the brain and surrounding structures were obtained without intravenous contrast. COMPARISON:  Brain MRI 06/17/2020 FINDINGS: Brain: Areas of abnormal diffusion restriction in the right basal ganglia are now more confluent. Foci in the infarcted region of the left parietal/temporal lobe are unchanged. Left parietal/temporal encephalomalacia. No acute or chronic hemorrhage. Normal white matter signal, parenchymal volume and CSF spaces. The midline structures are normal. Vascular: Major flow voids are preserved. Skull and upper cervical spine: Normal calvarium and skull base. Visualized upper cervical spine and soft tissues are normal. Sinuses/Orbits:No paranasal sinus fluid levels or advanced mucosal thickening. No mastoid or middle ear effusion. Normal orbits. IMPRESSION: 1. Increased size of acute to subacute infarcts of the right basal ganglia. No hemorrhage or mass effect. 2. Unchanged old left posterior MCA infarct. Electronically Signed   By: Deatra Robinson M.D.   On: 07/30/2020 19:01   DG Chest Port 1 View  Result Date: 07/30/2020 CLINICAL DATA:  Sepsis EXAM: PORTABLE CHEST 1 VIEW COMPARISON:  06/13/2020 FINDINGS: Lung volumes are small, but are symmetric. Bibasilar fibrotic changes are again identified. No superimposed focal pulmonary infiltrate. No pneumothorax or pleural effusion. Cardiac size is within normal limits. The pulmonary  vascularity is normal. No acute bone abnormality. IMPRESSION: Stable bibasilar pulmonary fibrotic changes. No radiographic evidence of acute cardiopulmonary disease. Electronically Signed   By: Helyn Numbers MD   On: 07/30/2020 01:14   CT RENAL STONE STUDY  Result Date: 07/30/2020 CLINICAL DATA:  Flank pain, sepsis, urinary tract infection EXAM: CT ABDOMEN AND PELVIS WITHOUT CONTRAST TECHNIQUE: Multidetector CT imaging of the abdomen and pelvis was performed following the standard protocol without IV contrast. COMPARISON:  None. FINDINGS: Lower chest: Bibasilar pulmonary infiltrates  are present, not well assessed on this examination. Mild coronary artery calcification. Global cardiac size within normal limits. No pericardial effusion. Hepatobiliary: No focal liver abnormality is seen. Status post cholecystectomy. No biliary dilatation. Pancreas: Unremarkable Spleen: Unremarkable Adrenals/Urinary Tract: Adrenal glands are unremarkable. Kidneys are normal, without renal calculi, focal lesion, or hydronephrosis. Bladder is unremarkable. Stomach/Bowel: Moderate descending and sigmoid colonic diverticulosis. The stomach, small bowel, and large bowel are otherwise unremarkable. Appendix normal. No free intraperitoneal gas or fluid. Vascular/Lymphatic: Moderate aortoiliac atherosclerotic calcification. No aortic aneurysm. No pathologic adenopathy within the abdomen and pelvis. Reproductive: Uterus and bilateral adnexa are unremarkable. Other: Tiny fat containing umbilical hernia.  Rectum unremarkable. Musculoskeletal: No acute bone abnormality. Degenerative changes are seen within the lumbar spine. No lytic or blastic bone lesions. IMPRESSION: Bibasilar pulmonary infiltrates, not well assessed on this examination. While associated with volume loss, these do not have the typical appearance of pulmonary fibrosis and additional considerations should include interstitial lung disease, as can be seen with connective tissue  disorders or drug reaction. No acute intra-abdominal pathology. No definite radiographic explanation for the patient's reported symptoms. Moderate distal colonic diverticulosis. Aortic Atherosclerosis (ICD10-I70.0). Electronically Signed   By: Helyn Numbers MD   On: 07/30/2020 06:14    Cardiac Studies   2D echo 06/15/2020 IMPRESSIONS    1. Left ventricular ejection fraction, by estimation, is 55 to 60%. The  left ventricle has normal function. The left ventricle has no regional  wall motion abnormalities. There is mild concentric left ventricular  hypertrophy. Left ventricular diastolic  parameters are consistent with Grade I diastolic dysfunction (impaired  relaxation).  2. Right ventricular systolic function is low normal. The right  ventricular size is normal. There is normal pulmonary artery systolic  pressure.  3. The mitral valve is grossly normal. No evidence of mitral valve  regurgitation.  4. The aortic valve is tricuspid. There is mild thickening of the aortic  valve. Aortic valve regurgitation is not visualized. No aortic stenosis is  present.   Comparison(s): No prior Echocardiogram.   Patient Profile     70 y.o. female with a hx of CVA '2019, HLD, HTN and SVT (possible Afib) who is being seen today for the evaluation of Atrial fibrillation with RVR in the setting of sepsis at the request of Dr. Toniann Fail.  Assessment & Plan    1.  Atrial fibrillation with RVR: Not clear during previous admission. Plan was for outpatient monitor. Initially in NSR on admission, but then developed atrial bigeminy with conversion to Afib RVR then back to SR with bigeminal PACs. Treated with IV metoprolol x2.  -- TSH WNL -- remains on IV heparin, IV metoprolol PRN q3hr>>will add on low dose BB once taking PO -- currently in SR -- CHA2DS2-VASc Score of at least 7, currently NPO therefore continue with IV heparin for now -- ultimately will transition to Eliquis 5mg  BID -- need to keep  K+>5 and Mag>2  2.  Septic shock: Likely secondary to UTI but exacerbated by atrial fibrillation with RVR.  Initially admitted to internal medicine, but care transferred to PCCM with persistent hypotension and tachycardia. --Noted to be in acute renal failure with hyperkalemia on admission --Elevated lactic acid, procalcitonin 0.18 --Antibiotics per primary  3.  Altered mental status: History of CVA --CT head shows no acute abnormality with old left MCA infarct  4.  Acute renal failure: On lisinopril prior to admission -- Cr 3.9>> 3.4>>2.64>>1.64 in the setting of acute sepsis/shock -- Initiated on sodium  bicarb drip on admission -- Aggressive IV fluids, avoiding nephrotoxic agents  5. Hyperkalemia/Hypokalemia: -- initially hyperkalemic and now borderline hypokalemia -- K+ 5.5>>3.5 -- try to keep K+>4      For questions or updates, please contact [facility] Please consult [email or web address] for contact info under        Signed, Armanda Magic, MD  07/31/2020, 8:48 AM

## 2020-07-31 NOTE — Progress Notes (Signed)
Initial Nutrition Assessment  DOCUMENTATION CODES:   Non-severe (moderate) malnutrition in context of chronic illness  INTERVENTION:   After Cortrak placement on 08/01/20, initiate tube feeding: - Start Osmolite 1.2 @ 25 ml/hr and advance by 10 ml q 4 hours to goal rate of 65 ml/hr (1560 ml/day)  Tube feeding regimen at goal rate provides 1872 kcal, 87 grams of protein, and 1279 ml of H2O.   NUTRITION DIAGNOSIS:   Moderate Malnutrition related to chronic illness as evidenced by mild fat depletion,moderate muscle depletion,percent weight loss (15.9% weight loss since 06/17/20).  GOAL:   Patient will meet greater than or equal to 90% of their needs  MONITOR:   Diet advancement,Labs,Weight trends,TF tolerance,Skin,I & O's  REASON FOR ASSESSMENT:   Consult Enteral/tube feeding initiation and management  ASSESSMENT:   70 year old female who presented on 3/30 with AMS. PMH of CVA, HTN, atrial fibrillation. Pt with sepsis secondary to UTI, AKI.   Discussed pt with RN and during ICU rounds. Pt seen by SLP today who recommends NPO. Plan is for Cortrak placement tomorrow. Consult received for tube feeding intiation and management.  Reviewed weight history in chart. Pt with an 18.2 kg weight loss since 06/17/20. This is a 15.9% weight loss in less than 2 months which is severe and significant for timeframe. Pt meets criteria for malnutrition.  RD was unable to obtain diet or weight history from pt at this time. No family available at time of RD visit.  Medications reviewed and include: SSI q 4 hours, IV abx, heparin, IV potassium phosphate 15 mmol once IVF: D5 @ 125 ml/hr  Labs reviewed: sodium 152, BUN 85, creatinine 1.64, phosphorus 2.2, lipase 69, ionized calcium 1.06 CBG's: 64-134 x 24 hours  I/O's: +9.4 L since admit  NUTRITION - FOCUSED PHYSICAL EXAM:  Flowsheet Row Most Recent Value  Orbital Region Mild depletion  Upper Arm Region Mild depletion  Thoracic and Lumbar  Region No depletion  Buccal Region Mild depletion  Temple Region No depletion  Clavicle Bone Region Mild depletion  Clavicle and Acromion Bone Region Moderate depletion  Scapular Bone Region Unable to assess  Dorsal Hand Moderate depletion  Patellar Region Mild depletion  Anterior Thigh Region Moderate depletion  Posterior Calf Region Moderate depletion  Edema (RD Assessment) None  Hair Reviewed  Eyes Reviewed  Mouth Reviewed  Skin Reviewed  Nails Reviewed       Diet Order:   Diet Order            Diet NPO time specified  Diet effective now                 EDUCATION NEEDS:   No education needs have been identified at this time  Skin:  Skin Assessment: Skin Integrity Issues: Stage II: sacrum, left buttocks  Last BM:  07/31/20  Height:   Ht Readings from Last 1 Encounters:  07/30/20 5\' 9"  (1.753 m)    Weight:   Wt Readings from Last 1 Encounters:  07/31/20 96 kg    BMI:  Body mass index is 31.25 kg/m.  Estimated Nutritional Needs:   Kcal:  1800-2000  Protein:  85-100 grams  Fluid:  1.8-2.0 L    08/02/20, MS, RD, LDN Inpatient Clinical Dietitian Please see AMiON for contact information.

## 2020-07-31 NOTE — Progress Notes (Signed)
eLink Physician-Brief Progress Note Patient Name: Lilymae Swiech DOB: January 04, 1951 MRN: [id number]   Date of Service  07/31/2020  HPI/Events of Note  Hypomagnesemia - Mg++ = 1.9 and Creatinine = 1.54.   eICU Interventions  Will replace Mg++.     Intervention Category Major Interventions: Electrolyte abnormality - evaluation and management  Marcos Peloso Eugene 07/31/2020, 11:42 PM

## 2020-07-31 NOTE — Progress Notes (Signed)
ANTICOAGULATION CONSULT NOTE - Follow Up Consult  Pharmacy Consult for IV Heparin Indication: atrial fibrillation  Allergies  Allergen Reactions  . Penicillins Swelling and Other (See Comments)    Throat swelling/ turned red  . Sulfa Antibiotics Swelling and Other (See Comments)    Throat swelling/turned red    Patient Measurements: Height: 5\' 9"  (175.3 cm) Weight: 96 kg (211 lb 10.3 oz) IBW/kg (Calculated) : 66.2 Heparin Dosing Weight: 87 kg   Vital Signs: Temp: 97.1 F (36.2 C) (03/31 1144) Temp Source: Oral (03/31 1144) BP: 124/61 (03/31 0600) Pulse Rate: 75 (03/31 0600)  Labs: Recent Labs    07/30/20 0059 07/30/20 0059 07/30/20 0517 07/30/20 0735 07/30/20 1119 07/30/20 1124 07/30/20 1642 07/30/20 2151 07/31/20 0354 07/31/20 0924  HGB 15.1*  --  13.3  --   --  11.2*  --   --  11.4*  --   HCT 48.2*  --  45.7  --   --  33.0*  --   --  36.2  --   PLT 247  --  185  --   --   --   --   --  159  --   APTT 22*  --   --   --   --   --   --   --   --   --   LABPROT 16.3*  --   --   --   --   --   --   --   --   --   INR 1.4*  --   --   --   --   --   --   --   --   --   HEPARINUNFRC  --   --   --   --   --   --  0.49  --  0.65  --   CREATININE 3.90*  --  3.43*  --  2.64*  --   --  1.88* 1.64*  --   TROPONINIHS  --    < > 77* 115*  --   --   --  202*  --  149*   < > = values in this interval not displayed.    Estimated Creatinine Clearance: 39.9 mL/min (A) (by C-G formula based on SCr of 1.64 mg/dL (H)).   Medical History: Past Medical History:  Diagnosis Date  . Hyperlipidemia   . Prediabetes   . Stroke [facility])     Assessment: 70 yr old female presented with severe sepsis and was found to be in atrial fibrillation with RVR. Pharmacy was consulted to dose IV heparin (pt was on no anticoagulants PTA).   Heparin level 3/31 within goal, approaching upper limit of normal at 0.65, with 1200 units/hr. Hgb 11.4, remains stable, plt 159. Per RN, no issues with IV or  bleeding observed and heparin level drawn appropriately.  Goal of Therapy:  Heparin level 0.3-0.7 units/ml Monitor platelets by anticoagulation protocol: Yes   Plan:  Decrease heparin to 1150 units/hr Monitor daily heparin levels  Monitor signs of bleeding, Hgb, Plts F/U transition to oral anticoagulant  4/31  PharmD Candidate, Class of 2022

## 2020-07-31 NOTE — Progress Notes (Signed)
Stroke Neurology Consultation Note  Consult Requested by: Dr. Everardo All Reason for Consult: Stroke  Consult Date:  07/31/20  The history was obtained from the patient and review of electronic medical records and I personally reviewed pertinent imaging films in PACS.  History of Present Illness:    Kristy Jensen is a 70 y.o. female with history of HTN, tachycardia, hyperglycemia and recent admission 06/27/20 for right basal ganglia infarct secondary to likely small vessel disease and prior history of left parietal embolic stroke leading to suspicion for underlying atrial fibrillation/flutter which was also mentioned in Ed but never documented on record.   Partial homonymous hemianopsia with impairment in the right lower > upper field in both eyes was found. At time of discharge a 30 day event monitor was ordered.  2D echo showed normal LVF with G1DD.  Unfortunately she never completed the event monitor.  She was encephalopathic post stroke and also found to have a UTI which may have contributed to her encephalopathy.   She presented this admission with EMS on arrival found patient to be encephalopathic, hypotensive, tachycardic in septic shock due to urosepsis with multiple metabolic derangements including renal failure with Scr 3.9, hypernatremia, hyperkalemia. Atrial fibrillation with RVR was also identified. Troponin was elevated. Cardiology consulted. Heparin drip was initiated. CHADS2VASC score is high at 7 per cardiology.  MRI scan of the brain done yesterday for increased confusion still shows slight increase size of her acute to subacute right basal ganglia infarct and unchanged over left posterior MCA infarct.  Date last known well: Unknown Time last known well: Unknown tPA Given: No:      Past Medical History:  Diagnosis Date  . Hyperlipidemia   . Prediabetes   . Stroke [facility])      History reviewed. No pertinent surgical history.  Family History  Problem Relation Age of Onset   . Heart disease Neg Hx      Social History:  reports that she has never smoked. She has never used smokeless tobacco. She reports current alcohol use. She reports that she does not use drugs.  Review of Systems: A full ROS was attempted today and was unable able to be performed.  Systems assessed include - Constitutional, Eyes, HENT, Respiratory, Cardiovascular, Gastrointestinal, Genitourinary, Integument/breast, Hematologic/lymphatic, Musculoskeletal, Neurological, Behavioral/Psych, Endocrine, Allergic/Immunologic - with pertinent responses as per HPI.  Allergies:  Allergies  Allergen Reactions  . Penicillins Swelling and Other (See Comments)    Throat swelling/ turned red  . Sulfa Antibiotics Swelling and Other (See Comments)    Throat swelling/turned red     Medications: I have reviewed the patient's current medications.  Test Results: CBC:  Recent Labs  Lab 07/30/20 0059 07/30/20 0517 07/30/20 1124 07/31/20 0354  WBC 19.8* 14.4*  --  9.6  NEUTROABS 17.1* 11.9*  --   --   HGB 15.1* 13.3 11.2* 11.4*  HCT 48.2* 45.7 33.0* 36.2  MCV 90.9 97.0  --  90.0  PLT 247 185  --  159   Basic Metabolic Panel:  Recent Labs  Lab 07/30/20 2151 07/31/20 0354  NA 150* 152*  K 3.3* 3.5  CL 116* 118*  CO2 26 26  GLUCOSE 169* 153*  BUN 96* 85*  CREATININE 1.88* 1.64*  CALCIUM 7.8* 8.1*  MG 1.6* 2.0  PHOS  --  2.2*   Liver Function Tests: Recent Labs  Lab 07/30/20 0059 07/30/20 0517  AST 15 17  ALT 10 6  ALKPHOS 71 65  BILITOT 0.9 0.8  PROT  6.7 5.8*  ALBUMIN 3.3* 2.7*   Recent Labs  Lab 07/30/20 0837  LIPASE 145*  AMYLASE 280*   No results for input(s): AMMONIA in the last 168 hours. Coagulation Studies:  Recent Labs    07/30/20 0059  LABPROT 16.3*  INR 1.4*   Cardiac Enzymes: No results for input(s): CKTOTAL, CKMB, CKMBINDEX, TROPONINI in the last 168 hours. BNP: Invalid input(s): POCBNP CBG:  Recent Labs  Lab 07/30/20 1921 07/30/20 1939 07/30/20 2321  07/31/20 0301 07/31/20 0705  GLUCAP 104* 119* 134* 118* 120*   Urinalysis:  Recent Labs  Lab 07/30/20 0214  COLORURINE AMBER*  LABSPEC 1.017  PHURINE 5.0  GLUCOSEU 50*  HGBUR MODERATE*  BILIRUBINUR NEGATIVE  KETONESUR 5*  PROTEINUR 100*  NITRITE NEGATIVE  LEUKOCYTESUR MODERATE*   Microbiology:  Results for orders placed or performed during the hospital encounter of 07/30/20  Blood Culture (routine x 2)     Status: None (Preliminary result)   Collection Time: 07/30/20  1:27 AM   Specimen: BLOOD LEFT WRIST  Result Value Ref Range Status   Specimen Description BLOOD LEFT WRIST  Final   Special Requests   Final    BOTTLES DRAWN AEROBIC AND ANAEROBIC Blood Culture adequate volume   Culture   Final    NO GROWTH 1 DAY Performed at [facility], [address] [place], [place] [address]    Report Status PENDING  Incomplete  Resp Panel by RT-PCR (Flu A&B, Covid) Nasopharyngeal Swab     Status: None   Collection Time: 07/30/20  1:45 AM   Specimen: Nasopharyngeal Swab; Nasopharyngeal(NP) swabs in vial transport medium  Result Value Ref Range Status   SARS Coronavirus 2 by RT PCR NEGATIVE NEGATIVE Final    Comment: (NOTE) SARS-CoV-2 target nucleic acids are NOT DETECTED.  The SARS-CoV-2 RNA is generally detectable in upper respiratory specimens during the acute phase of infection. The lowest concentration of SARS-CoV-2 viral copies this assay can detect is 138 copies/mL. A negative result does not preclude SARS-Cov-2 infection and should not be used as the sole basis for treatment or other patient management decisions. A negative result may occur with  improper specimen collection/handling, submission of specimen other than nasopharyngeal swab, presence of viral mutation(s) within the areas targeted by this assay, and inadequate number of viral copies(<138 copies/mL). A negative result must be combined with clinical observations, patient history, and  epidemiological information. The expected result is Negative.  Fact Sheet for Patients:  BloggerCourse.com  Fact Sheet for Healthcare Providers:  SeriousBroker.it  This test is no t yet approved or cleared by the [place] FDA and  has been authorized for detection and/or diagnosis of SARS-CoV-2 by FDA under an Emergency Use Authorization (EUA). This EUA will remain  in effect (meaning this test can be used) for the duration of the COVID-19 declaration under Section 564(b)(1) of the Act, 21 U.S.C.section 360bbb-3(b)(1), unless the authorization is terminated  or revoked sooner.       Influenza A by PCR NEGATIVE NEGATIVE Final   Influenza B by PCR NEGATIVE NEGATIVE Final    Comment: (NOTE) The Xpert Xpress SARS-CoV-2/FLU/RSV plus assay is intended as an aid in the diagnosis of influenza from Nasopharyngeal swab specimens and should not be used as a sole basis for treatment. Nasal washings and aspirates are unacceptable for Xpert Xpress SARS-CoV-2/FLU/RSV testing.  Fact Sheet for Patients: BloggerCourse.com  Fact Sheet for Healthcare Providers: SeriousBroker.it  This test is not yet approved or cleared by the [place] FDA and has been authorized for detection and/or diagnosis of SARS-CoV-2 by FDA under an Emergency Use Authorization (EUA). This EUA will remain in effect (meaning this test can be used) for the duration of the COVID-19 declaration under Section 564(b)(1) of the Act, 21 U.S.C. section 360bbb-3(b)(1), unless the authorization is terminated or revoked.  Performed at [facility], [address] [place], [place] [address]   Blood Culture (routine x 2)     Status: None (Preliminary result)   Collection Time: 07/30/20  1:53 AM   Specimen: BLOOD  Result Value Ref Range Status   Specimen Description BLOOD SITE NOT SPECIFIED  Final   Special Requests    Final    BOTTLES DRAWN AEROBIC AND ANAEROBIC Blood Culture adequate volume   Culture   Final    NO GROWTH 1 DAY Performed at [facility], [address] [place], [place] [address]    Report Status PENDING  Incomplete  MRSA PCR Screening     Status: None   Collection Time: 07/30/20  3:31 PM   Specimen: Nasopharyngeal  Result Value Ref Range Status   MRSA by PCR NEGATIVE NEGATIVE Final    Comment:        The GeneXpert MRSA Assay (FDA approved for NASAL specimens only), is one component of a comprehensive MRSA colonization surveillance program. It is not intended to diagnose MRSA infection nor to guide or monitor treatment for MRSA infections. Performed at [facility], [address] [place], [place] [address]    Lipid Panel:     Component Value Date/Time   CHOL 162 06/17/2020 0104   TRIG 118 06/17/2020 0104   HDL 33 (L) 06/17/2020 0104   CHOLHDL 4.9 06/17/2020 0104   VLDL 24 06/17/2020 0104   LDLCALC 105 (H) 06/17/2020 0104   HgbA1c:  Lab Results  Component Value Date   HGBA1C 6.0 (H) 06/14/2020   Urine Drug Screen:     Component Value Date/Time   LABOPIA NONE DETECTED 06/13/2020 1618   COCAINSCRNUR NONE DETECTED 06/13/2020 1618   LABBENZ NONE DETECTED 06/13/2020 1618   AMPHETMU NONE DETECTED 06/13/2020 1618   THCU NONE DETECTED 06/13/2020 1618   LABBARB NONE DETECTED 06/13/2020 1618    Alcohol Level: No results for input(s): ETH in the last 168 hours.  CT Head Wo Contrast  Result Date: 07/30/2020 CLINICAL DATA:  Delirium EXAM: CT HEAD WITHOUT CONTRAST TECHNIQUE: Contiguous axial images were obtained from the base of the skull through the vertex without intravenous contrast. COMPARISON:  06/13/2020 FINDINGS: Brain: There is an old posterior left MCA territory infarct. No acute hemorrhage. Mild generalized atrophy with old bilateral small vessel infarcts. Vascular: Atherosclerotic calcification of the internal carotid arteries at the skull base. No  abnormal hyperdensity of the major intracranial arteries or dural venous sinuses. Skull: The visualized skull base, calvarium and extracranial soft tissues are normal. Sinuses/Orbits: No fluid levels or advanced mucosal thickening of the visualized paranasal sinuses. No mastoid or middle ear effusion. The orbits are normal. IMPRESSION: 1. No acute intracranial abnormality. 2. Old posterior left MCA territory infarct. Electronically Signed   By: Deatra RobinsonKevin  Herman M.D.   On: 07/30/2020 01:34   MR BRAIN WO CONTRAST  Result Date: 07/30/2020 CLINICAL DATA:  Encephalopathy EXAM: MRI HEAD WITHOUT CONTRAST TECHNIQUE: Multiplanar, multiecho pulse sequences of the brain and surrounding structures were obtained without intravenous contrast. COMPARISON:  Brain MRI 06/17/2020 FINDINGS: Brain: Areas of abnormal diffusion restriction in the right basal ganglia  are now more confluent. Foci in the infarcted region of the left parietal/temporal lobe are unchanged. Left parietal/temporal encephalomalacia. No acute or chronic hemorrhage. Normal white matter signal, parenchymal volume and CSF spaces. The midline structures are normal. Vascular: Major flow voids are preserved. Skull and upper cervical spine: Normal calvarium and skull base. Visualized upper cervical spine and soft tissues are normal. Sinuses/Orbits:No paranasal sinus fluid levels or advanced mucosal thickening. No mastoid or middle ear effusion. Normal orbits. IMPRESSION: 1. Increased size of acute to subacute infarcts of the right basal ganglia. No hemorrhage or mass effect. 2. Unchanged old left posterior MCA infarct. Electronically Signed   By: Deatra Robinson M.D.   On: 07/30/2020 19:01   DG Chest Port 1 View  Result Date: 07/30/2020 CLINICAL DATA:  Sepsis EXAM: PORTABLE CHEST 1 VIEW COMPARISON:  06/13/2020 FINDINGS: Lung volumes are small, but are symmetric. Bibasilar fibrotic changes are again identified. No superimposed focal pulmonary infiltrate. No  pneumothorax or pleural effusion. Cardiac size is within normal limits. The pulmonary vascularity is normal. No acute bone abnormality. IMPRESSION: Stable bibasilar pulmonary fibrotic changes. No radiographic evidence of acute cardiopulmonary disease. Electronically Signed   By: Helyn Numbers MD   On: 07/30/2020 01:14   CT RENAL STONE STUDY  Result Date: 07/30/2020 CLINICAL DATA:  Flank pain, sepsis, urinary tract infection EXAM: CT ABDOMEN AND PELVIS WITHOUT CONTRAST TECHNIQUE: Multidetector CT imaging of the abdomen and pelvis was performed following the standard protocol without IV contrast. COMPARISON:  None. FINDINGS: Lower chest: Bibasilar pulmonary infiltrates are present, not well assessed on this examination. Mild coronary artery calcification. Global cardiac size within normal limits. No pericardial effusion. Hepatobiliary: No focal liver abnormality is seen. Status post cholecystectomy. No biliary dilatation. Pancreas: Unremarkable Spleen: Unremarkable Adrenals/Urinary Tract: Adrenal glands are unremarkable. Kidneys are normal, without renal calculi, focal lesion, or hydronephrosis. Bladder is unremarkable. Stomach/Bowel: Moderate descending and sigmoid colonic diverticulosis. The stomach, small bowel, and large bowel are otherwise unremarkable. Appendix normal. No free intraperitoneal gas or fluid. Vascular/Lymphatic: Moderate aortoiliac atherosclerotic calcification. No aortic aneurysm. No pathologic adenopathy within the abdomen and pelvis. Reproductive: Uterus and bilateral adnexa are unremarkable. Other: Tiny fat containing umbilical hernia.  Rectum unremarkable. Musculoskeletal: No acute bone abnormality. Degenerative changes are seen within the lumbar spine. No lytic or blastic bone lesions. IMPRESSION: Bibasilar pulmonary infiltrates, not well assessed on this examination. While associated with volume loss, these do not have the typical appearance of pulmonary fibrosis and additional  considerations should include interstitial lung disease, as can be seen with connective tissue disorders or drug reaction. No acute intra-abdominal pathology. No definite radiographic explanation for the patient's reported symptoms. Moderate distal colonic diverticulosis. Aortic Atherosclerosis (ICD10-I70.0). Electronically Signed   By: Helyn Numbers MD   On: 07/30/2020 06:14         Physical Examination: Temp:  [97.2 F (36.2 C)-98.3 F (36.8 C)] 98.3 F (36.8 C) (03/31 0745) Pulse Rate:  [50-99] 75 (03/31 0600) Resp:  [8-25] 11 (03/31 0600) BP: (102-141)/(51-117) 124/61 (03/31 0600) SpO2:  [98 %-100 %] 100 % (03/31 0600) Weight:  [96 kg] 96 kg (03/31 0500)  General - Well nourished, well developed , middle-aged lady in no apparent distress   Ophthalmologic - fundi not visualized due to noncooperation.  Cardiovascular - Regular rate and rhythm  Mental Status -patient is awake alert she is disoriented to time place and person.  She has diminished attention registration and recall.  There is easy distractibility.  She is able to speak  short sentences and words correctly without slurred speech.  She is able to name and repeat.  She can follow only simple one-step and some two-step commands.  She does have intermittent tremor of both upper and lower extremities which increases with position holding and has a flapping character.  There is no cogwheel rigidity.   Gait and Station - deferred.   Assessment:  Ms. Dorann Davidson is a 70 y.o. female with recent admission in February 2022 due to small basal ganglia infarct in the setting of sepsis and confusion with recent MRI scan showing slight increase in basal ganglia infarct.  70 year old African-American lady with   right basal ganglia infarct in February 2022 secondary to likely small vessel disease given prior history of left parietal embolic stroke suspicion for underlying atrial fibrillation/flutter which was also mentioned in Ed but  never documented on record.    She has persistent altered mental status and confusion likely multifactorial due to encephalopathy from sepsis and renal failure and repeat MRI shows slight extension of basal ganglia infarct which is not enough to explain her persistent confusion which is likely multifactorial  CTA head & neck:  few small hypodense foci are seen within the right caudate nucleus corresponding to acute infarct   MRI: Multiple small infarcts involving the right basal ganglia  2D Echo: EF: 55-60%, grade 1 diastolic dysfunction    LDL 105  HgbA1c 6.0  Recommend anticoagulation with Eliquis given new diagnosis of atrial fibrillation.  Continue aggressive risk factor modification.  Treatment of sepsis and renal failure and medical parameter as per critical care team.  No family available at the bedside for discussion.  Discussed with Dr. Everardo All. Greater than 50% time during this 60-minute consultation visit was spent on counseling and coordination of care about her confusion and altered mental status and abnormal MRI and answering questions.  Stroke team will sign off.  Kindly call for questions.  Hospital day # 1   Thank you for this consultation and allowing [place] to participate in the care of this patient. Delia Heady MD To contact Stroke Continuity provider, please refer to [email or web address]. After hours, contact General Neurology

## 2020-07-31 NOTE — Consult Note (Signed)
NAME:  Kristy Jensen, MRN:  [id number], DOB:  1950-12-18, LOS: 1 ADMISSION DATE:  07/30/2020, CONSULTATION DATE: 07/30/2020 REFERRING MD: [facility]ad, CHIEF COMPLAINT: Altered mental status hypotension  History of Present Illness:  Frail-appearing 70 year old female with a history of CVA, urinary tract infections, atrial fibrillation ventricular response and SVT.  She was discharged in February 2022 urinary tract infection and altered mental status.  Now presents with altered mental status ventricular heart rate with atrial fibrillation .  Further noted to have acute renal failure, hyper kalemia and hyper natremia.  Pulmonary critical care was asked to come to the bedside due to hypotension and rapid ventricular rate in the setting of atrial fibrillation.  She is poorly responsive.  She is in acute renal failure with creatinine 3.43 with a BUN of 142 normal creatinine is 0.9.  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  She appears to be under hydrated at this time we will institute another 1000 cc of lactated Ringer's and repeat 5 mg of Metroprolol.  Will admit to the intensive care unit for further evaluation and treatment.  Cardiology has been consulted have not evaluated patient at this time.  Her last 2D echo in February 2022 shows EF 60% and grade 1 diastolic dysfunction.  She appears much older than her stated age and appears to be failure to thrive.  Pertinent  Medical History   Past Medical History:  Diagnosis Date  . Hyperlipidemia   . Prediabetes   . Stroke [facility])      Significant Hospital Events: Including procedures, antibiotic start and stop dates in addition to other pertinent events   . 07/30/2020 admit to the intensive care unit  Interim History / Subjective:  Remains lethargic. No pressor requirement overnight. Ok UOP  Objective   Blood pressure 124/61, pulse 75, temperature 98.3 F (36.8 C), temperature source Axillary, resp. rate 11, height 5\' 9"  (1.753 m), weight  96 kg, SpO2 100 %.        Intake/Output Summary (Last 24 hours) at 07/31/2020 0753 Last data filed at 07/31/2020 0600 Gross per 24 hour  Intake 4560.94 ml  Output 640 ml  Net 3920.94 ml   Filed Weights   07/30/20 0056 07/31/20 0500  Weight: 99.8 kg 96 kg   Physical Exam: General: Appears older than stated age, lethargic no acute distress HENT: [place], AT, OP clear, MMM Eyes: EOMI, no scleral icterus Respiratory: Clear to auscultation bilaterally.  No crackles, wheezing or rales Cardiovascular: RRR, -M/R/G, no JVD GI: BS+, soft, nontender Extremities:-Edema,-tenderness Neuro: Lethargic, opens eyes to voice, does not follow commands, withdraws extremities x 4 to noxious stimuli  Labs/imaging that I havepersonally reviewed  (right click and "Reselect all SmartList Selections" daily)  BCx 3/30 > UCX 3/30 >  Na 150>152 K 3.5 BUN 96>85 Cr 1.88>1.64  Resolved Hospital Problem list   Shock - resolved with fluid resuscitation, no pressors  Assessment & Plan:   Severe sepsis secondary to presumed UTI - improving BP -S/p 6L since admission  -Continue maintenance fluids. Change to D5 for elevated Na -Continue aztreonam. DC Vanc and flagyl -Follow-up cultures -Trend LA  Acute toxic metabolic encephalopathy  Hx CVA - increased right basal ganglia infarct compared to 2/15 MRI -Consult Neurology -Correct underlying electrolyte abnormalities  Hypernatremia -Trend BMET -D5 -Obtain enteral access and start FWF  Atrial fibrillation - RVR resolved. Currently NSR Demand ischemia -Cardiology -Heparin gtt -Goal K >4 and Mg >2  Hypokalemia Hypophos -Trend -Repleted today  Acute renal  failure -IVF resuscitation -Monitor UOP/Cr -Avoid nephrotoxins -Trend BMET  Goiter cannot rule out some type of cancerous process. TSH wnl -CT of the neck when stable  Best practice (right click and "Reselect all SmartList Selections" daily)  Diet:  NPO Pain/Anxiety/Delirium protocol (if  indicated): No VAP protocol (if indicated): Not indicated DVT prophylaxis: Systemic AC GI prophylaxis: N/A and PPI Glucose control:  SSI No Central venous access:  N/A Arterial line:  N/A Foley:  N/A Mobility:  bed rest  PT consulted: N/A Last date of multidisciplinary goals of care discussion [not as of yet] Code Status:  full code, needs to be addressed. Disposition: ICU  Labs   CBC: Recent Labs  Lab 07/30/20 0059 07/30/20 0517 07/30/20 1124 07/31/20 0354  WBC 19.8* 14.4*  --  9.6  NEUTROABS 17.1* 11.9*  --   --   HGB 15.1* 13.3 11.2* 11.4*  HCT 48.2* 45.7 33.0* 36.2  MCV 90.9 97.0  --  90.0  PLT 247 185  --  159    Basic Metabolic Panel: Recent Labs  Lab 07/30/20 0059 07/30/20 0517 07/30/20 1119 07/30/20 1124 07/30/20 2151 07/31/20 0354  NA 151* 152* 152* 152* 150* 152*  K 5.5* 5.4* 4.4 4.4 3.3* 3.5  CL 121* 123* 119*  --  116* 118*  CO2 13* 11* 18*  --  26 26  GLUCOSE 172* 140* 118*  --  169* 153*  BUN 150* 142* 115*  --  96* 85*  CREATININE 3.90* 3.43* 2.64*  --  1.88* 1.64*  CALCIUM 9.8 8.9 8.3*  --  7.8* 8.1*  MG  --   --   --   --  1.6* 2.0  PHOS  --   --   --   --   --  2.2*   GFR: Estimated Creatinine Clearance: 39.9 mL/min (A) (by C-G formula based on SCr of 1.64 mg/dL (H)). Recent Labs  Lab 07/30/20 0059 07/30/20 0127 07/30/20 0517 07/30/20 0522 07/30/20 0735 07/30/20 0837 07/30/20 1119 07/30/20 2151 07/31/20 0354  PROCALCITON  --   --  <0.10  --   --  0.18  --   --   --   WBC 19.8*  --  14.4*  --   --   --   --   --  9.6  LATICACIDVEN  --    < >  --  2.0* 4.1*  --  3.0* 2.2*  --    < > = values in this interval not displayed.    Liver Function Tests: Recent Labs  Lab 07/30/20 0059 07/30/20 0517  AST 15 17  ALT 10 6  ALKPHOS 71 65  BILITOT 0.9 0.8  PROT 6.7 5.8*  ALBUMIN 3.3* 2.7*   Recent Labs  Lab 07/30/20 0837  LIPASE 145*  AMYLASE 280*   No results for input(s): AMMONIA in the last 168 hours.  ABG    Component  Value Date/Time   HCO3 17.8 (L) 07/30/2020 1124   TCO2 19 (L) 07/30/2020 1124   ACIDBASEDEF 8.0 (H) 07/30/2020 1124   O2SAT 51.0 07/30/2020 1124     Coagulation Profile: Recent Labs  Lab 07/30/20 0059  INR 1.4*    Cardiac Enzymes: No results for input(s): CKTOTAL, CKMB, CKMBINDEX, TROPONINI in the last 168 hours.  HbA1C: Hgb A1c MFr Bld  Date/Time Value Ref Range Status  06/14/2020 03:37 AM 6.0 (H) 4.8 - 5.6 % Final    Comment:    (NOTE) Pre diabetes:  5.7%-6.4%  Diabetes:              >6.4%  Glycemic control for   <7.0% adults with diabetes     CBG: Recent Labs  Lab 07/30/20 1921 07/30/20 1939 07/30/20 2321 07/31/20 0301 07/31/20 0705  GLUCAP 104* 119* 134* 118* 120*    Critical care time: 45 min     The patient is critically ill with multiple organ systems failure and requires high complexity decision making for assessment and support, frequent evaluation and titration of therapies, application of advanced monitoring technologies and extensive interpretation of multiple databases.  Independent Critical Care Time: 45 Minutes.   Mechele Collin, M.D. [facility] Pulmonary/Critical Care Medicine 07/31/2020 7:53 AM   Please see Amion for pager number to reach on-call Pulmonary and Critical Care Team.

## 2020-07-31 NOTE — Evaluation (Signed)
Clinical/Bedside Swallow Evaluation Patient Details  Name: Kristy Jensen MRN: [id number] Date of Birth: 1950/12/06  Today's Date: 07/31/2020 Time: SLP Start Time (ACUTE ONLY): 0940 SLP Stop Time (ACUTE ONLY): 0959 SLP Time Calculation (min) (ACUTE ONLY): 19.7 min  Past Medical History:  Past Medical History:  Diagnosis Date  . Hyperlipidemia   . Prediabetes   . Stroke [facility])    Past Surgical History: History reviewed. No pertinent surgical history. HPI:  Pt is a 70 year old female with a history of CVA, urinary tract infections, atrial fibrillation ventricular response and SVT.  She was discharged in February 2022 following urinary tract infection and altered mental status. She presented to the ED on 3/30 with altered mental status  and ventricular heart rate with atrial fibrillation. 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 MRI brain completed due to encephalopathy on 3/30: Increased size of acute to subacute infarcts of the right basal ganglia. CXR 3/30: Stable bibasilar pulmonary fibrotic changes.   Assessment / Plan / Recommendation Clinical Impression  Pt was seen for bedside swallow evaluation. She was unable to provide any history regarding her swallow function and exhibited difficulty following commands. Oral mechanism exam was limited due to pt's difficulty following commands; however, oral motor strength and ROM appeared grossly WFL; lingual ROM was limited, but the impact of her understanding of the command on this is questioned. She presented with adequate natural dentition. She demonstrated reduced bolus awareness with limited, and sometimes absent, bolus manipulation. Pt was unable to demonstrate a volitional swallow and swallowing of trials was inconsistent despite frequent cueing. Pt does not present as a candidate for safe oral intake at this time. It is therefore recommended that her NPO status be maintained and SLP will follow to assess  improvement in swallow function. SLP Visit Diagnosis: Dysphagia, unspecified (R13.10)    Aspiration Risk  Moderate aspiration risk    Diet Recommendation NPO   Medication Administration: Via alternative means    Other  Recommendations Oral Care Recommendations: Oral care QID   Follow up Recommendations  (TBD)      Frequency and Duration min 2x/week  2 weeks       Prognosis Prognosis for Safe Diet Advancement: Good Barriers to Reach Goals: Cognitive deficits      Swallow Study   General Date of Onset: 07/30/20 HPI: Pt is a 70 year old female with a history of CVA, urinary tract infections, atrial fibrillation ventricular response and SVT.  She was discharged in February 2022 following urinary tract infection and altered mental status. She presented to the ED on 3/30 with altered mental status  and ventricular heart rate with atrial fibrillation. 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 MRI brain completed due to encephalopathy on 3/30: Increased size of acute to subacute infarcts of the right basal ganglia. CXR 3/30: Stable bibasilar pulmonary fibrotic changes. Type of Study: Bedside Swallow Evaluation Previous Swallow Assessment: None Diet Prior to this Study: NPO Temperature Spikes Noted: No Respiratory Status: Room air History of Recent Intubation: No Behavior/Cognition: Alert;Cooperative;Pleasant mood;Confused Oral Cavity Assessment: Within Functional Limits Oral Care Completed by SLP: No Oral Cavity - Dentition: Adequate natural dentition Vision: Functional for self-feeding Self-Feeding Abilities: Needs assist Patient Positioning: Upright in bed;Postural control adequate for testing Baseline Vocal Quality: Low vocal intensity Volitional Cough: Cognitively unable to elicit Volitional Swallow: Unable to elicit    Oral/Motor/Sensory Function Overall Oral Motor/Sensory Function: Within  functional limits (though limited)   Ice Chips  Ice chips: Impaired Presentation: Spoon Oral Phase Impairments: Poor awareness of bolus   Thin Liquid Thin Liquid: Impaired Presentation: Spoon Oral Phase Impairments: Poor awareness of bolus Pharyngeal  Phase Impairments: Throat Clearing - Delayed    Nectar Thick Nectar Thick Liquid: Not tested   Honey Thick Honey Thick Liquid: Not tested   Puree Puree: Impaired Presentation: Spoon Oral Phase Impairments: Poor awareness of bolus Oral Phase Functional Implications: Oral holding;Oral residue   Solid     Solid: Not tested     Navy Belay I. Vear Clock, MS, CCC-SLP Acute Rehabilitation Services Office number [phone] Pager [phone]   Scheryl Marten 07/31/2020,10:45 AM

## 2020-08-01 ENCOUNTER — Ambulatory Visit: Payer: Medicare HMO | Admitting: Cardiology

## 2020-08-01 DIAGNOSIS — E440 Moderate protein-calorie malnutrition: Secondary | ICD-10-CM | POA: Diagnosis present

## 2020-08-01 DIAGNOSIS — R579 Shock, unspecified: Secondary | ICD-10-CM | POA: Diagnosis not present

## 2020-08-01 DIAGNOSIS — N170 Acute kidney failure with tubular necrosis: Secondary | ICD-10-CM | POA: Diagnosis not present

## 2020-08-01 DIAGNOSIS — A419 Sepsis, unspecified organism: Secondary | ICD-10-CM

## 2020-08-01 DIAGNOSIS — I4891 Unspecified atrial fibrillation: Secondary | ICD-10-CM | POA: Diagnosis not present

## 2020-08-01 LAB — BLOOD CULTURE ID PANEL (REFLEXED) - BCID2

## 2020-08-01 LAB — CBC
HCT: 36 % (ref 36.0–46.0)
Hemoglobin: 10.8 g/dL — ABNORMAL LOW (ref 12.0–15.0)
MCH: 27.8 pg (ref 26.0–34.0)
MCHC: 30 g/dL (ref 30.0–36.0)
MCV: 92.5 fL (ref 80.0–100.0)
Platelets: 151 10*3/uL (ref 150–400)
RBC: 3.89 MIL/uL (ref 3.87–5.11)
RDW: 15.4 % (ref 11.5–15.5)
WBC: 6.1 10*3/uL (ref 4.0–10.5)
nRBC: 0 % (ref 0.0–0.2)

## 2020-08-01 LAB — PHOSPHORUS: Phosphorus: 1.9 mg/dL — ABNORMAL LOW (ref 2.5–4.6)

## 2020-08-01 LAB — GLUCOSE, CAPILLARY
Glucose-Capillary: 114 mg/dL — ABNORMAL HIGH (ref 70–99)
Glucose-Capillary: 115 mg/dL — ABNORMAL HIGH (ref 70–99)
Glucose-Capillary: 123 mg/dL — ABNORMAL HIGH (ref 70–99)
Glucose-Capillary: 125 mg/dL — ABNORMAL HIGH (ref 70–99)
Glucose-Capillary: 89 mg/dL (ref 70–99)
Glucose-Capillary: 91 mg/dL (ref 70–99)

## 2020-08-01 LAB — BASIC METABOLIC PANEL
Anion gap: 11 (ref 5–15)
BUN: 51 mg/dL — ABNORMAL HIGH (ref 8–23)
CO2: 22 mmol/L (ref 22–32)
Calcium: 8 mg/dL — ABNORMAL LOW (ref 8.9–10.3)
Chloride: 115 mmol/L — ABNORMAL HIGH (ref 98–111)
Creatinine, Ser: 1.11 mg/dL — ABNORMAL HIGH (ref 0.44–1.00)
GFR, Estimated: 54 mL/min — ABNORMAL LOW (ref >60)
Glucose, Bld: 124 mg/dL — ABNORMAL HIGH (ref 70–99)
Potassium: 3.6 mmol/L (ref 3.5–5.1)
Sodium: 148 mmol/L — ABNORMAL HIGH (ref 135–145)

## 2020-08-01 LAB — HEPARIN LEVEL (UNFRACTIONATED): Heparin Unfractionated: 0.58 IU/mL (ref 0.30–0.70)

## 2020-08-01 LAB — LEGIONELLA PNEUMOPHILA SEROGP 1 UR AG: L. pneumophila Serogp 1 Ur Ag: NEGATIVE

## 2020-08-01 LAB — MAGNESIUM: Magnesium: 1.8 mg/dL (ref 1.7–2.4)

## 2020-08-01 MED ORDER — PHENYLEPHRINE HCL-NACL 10-0.9 MG/250ML-% IV SOLN: 25.0000 ug/min | INTRAVENOUS | Status: DC

## 2020-08-01 MED ORDER — POTASSIUM PHOSPHATES 15 MMOLE/5ML IV SOLN
30.0000 mmol | Freq: Once | INTRAVENOUS | Status: AC
  Administered 2020-08-01: 30 mmol via INTRAVENOUS
  Filled 2020-08-01: qty 10

## 2020-08-01 MED ORDER — SODIUM CHLORIDE 0.9 % IV SOLN: 250.0000 mL | INTRAVENOUS | Status: DC

## 2020-08-01 MED ORDER — ALBUMIN HUMAN 25 % IV SOLN
25.0000 g | Freq: Once | INTRAVENOUS | Status: AC
  Administered 2020-08-01: 25 g via INTRAVENOUS
  Filled 2020-08-01: qty 100

## 2020-08-01 NOTE — Progress Notes (Signed)
Progress Note  Patient Name: Kristy Jensen Date of Encounter: 08/01/2020  [facility] Cardiologist: Meriam Sprague, MD   Subjective   Remains very lethargic.  Became hypotensive last night and now on phenylephrine and getting albumin.  Tele with NSR and PACs  Inpatient Medications    Scheduled Meds: . chlorhexidine  15 mL Mouth Rinse BID  . Chlorhexidine Gluconate Cloth  6 each Topical Daily  . insulin aspart  0-6 Units Subcutaneous Q4H  . mouth rinse  15 mL Mouth Rinse q12n4p   Continuous Infusions: . sodium chloride Stopped (08/01/20 0210)  . sodium chloride    . aztreonam 1 g (08/01/20 0612)  . dextrose 125 mL/hr at 08/01/20 0600  . feeding supplement (OSMOLITE 1.2 CAL)    . heparin 1,100 Units/hr (08/01/20 0745)  . phenylephrine (NEO-SYNEPHRINE) Adult infusion     PRN Meds: sodium chloride, acetaminophen **OR** acetaminophen, docusate sodium, polyethylene glycol   Vital Signs    Vitals:   08/01/20 0325 08/01/20 0400 08/01/20 0500 08/01/20 0600  BP:  120/84 (!) 116/56 92/71  Pulse:  65 69 71  Resp:  11 11 (!) 21  Temp: 97.7 F (36.5 C)     TempSrc: Axillary     SpO2:  100% 100% 100%  Weight:      Height:        Intake/Output Summary (Last 24 hours) at 08/01/2020 0821 Last data filed at 08/01/2020 0600 Gross per 24 hour  Intake 3837.06 ml  Output 1250 ml  Net 2587.06 ml   Last 3 Weights 07/31/2020 07/30/2020 06/17/2020  Weight (lbs) 211 lb 10.3 oz 220 lb 251 lb 12.3 oz  Weight (kg) 96 kg 99.791 kg 114.2 kg      Telemetry    NSR with PACs - Personally Reviewed  ECG    No new EKG to review - Personally Reviewed  Physical Exam   GEN: thin frail in NAD HEENT: Normal NECK: No JVD; No carotid bruits LYMPHATICS: No lymphadenopathy CARDIAC:RRR, no murmurs, rubs, gallops RESPIRATORY:  Clear to auscultation without rales, wheezing or rhonchi  ABDOMEN: Soft, non-tender, non-distended MUSCULOSKELETAL:  No edema; No deformity  SKIN: Warm and  dry NEUROLOGIC:  Cannot assess PSYCHIATRIC:  Cannot assess   Labs    High Sensitivity Troponin:   Recent Labs  Lab 07/30/20 0517 07/30/20 0735 07/30/20 2151 07/31/20 0924  TROPONINIHS 77* 115* 202* 149*      Chemistry Recent Labs  Lab 07/30/20 0059 07/30/20 0517 07/30/20 1119 07/31/20 0354 07/31/20 1509 08/01/20 0501  NA 151* 152*   < > 152* 152* 148*  K 5.5* 5.4*   < > 3.5 3.8 3.6  CL 121* 123*   < > 118* 118* 115*  CO2 13* 11*   < > 26 23 22   GLUCOSE 172* 140*   < > 153* 154* 124*  BUN 150* 142*   < > 85* 71* 51*  CREATININE 3.90* 3.43*   < > 1.64* 1.49* 1.11*  CALCIUM 9.8 8.9   < > 8.1* 8.2* 8.0*  PROT 6.7 5.8*  --   --   --   --   ALBUMIN 3.3* 2.7*  --   --   --   --   AST 15 17  --   --   --   --   ALT 10 6  --   --   --   --   ALKPHOS 71 65  --   --   --   --  BILITOT 0.9 0.8  --   --   --   --   GFRNONAA 12* 14*   < > 34* 38* 54*  ANIONGAP 17* 18*   < > 8 11 11    < > = values in this interval not displayed.     Hematology Recent Labs  Lab 07/30/20 0517 07/30/20 1124 07/31/20 0354 08/01/20 0501  WBC 14.4*  --  9.6 6.1  RBC 4.71  --  4.02 3.89  HGB 13.3 11.2* 11.4* 10.8*  HCT 45.7 33.0* 36.2 36.0  MCV 97.0  --  90.0 92.5  MCH 28.2  --  28.4 27.8  MCHC 29.1*  --  31.5 30.0  RDW 15.4  --  15.1 15.4  PLT 185  --  159 151    BNPNo results for input(s): BNP, PROBNP in the last 168 hours.   DDimer No results for input(s): DDIMER in the last 168 hours.   Radiology    MR BRAIN WO CONTRAST  Result Date: 07/30/2020 CLINICAL DATA:  Encephalopathy EXAM: MRI HEAD WITHOUT CONTRAST TECHNIQUE: Multiplanar, multiecho pulse sequences of the brain and surrounding structures were obtained without intravenous contrast. COMPARISON:  Brain MRI 06/17/2020 FINDINGS: Brain: Areas of abnormal diffusion restriction in the right basal ganglia are now more confluent. Foci in the infarcted region of the left parietal/temporal lobe are unchanged. Left parietal/temporal  encephalomalacia. No acute or chronic hemorrhage. Normal white matter signal, parenchymal volume and CSF spaces. The midline structures are normal. Vascular: Major flow voids are preserved. Skull and upper cervical spine: Normal calvarium and skull base. Visualized upper cervical spine and soft tissues are normal. Sinuses/Orbits:No paranasal sinus fluid levels or advanced mucosal thickening. No mastoid or middle ear effusion. Normal orbits. IMPRESSION: 1. Increased size of acute to subacute infarcts of the right basal ganglia. No hemorrhage or mass effect. 2. Unchanged old left posterior MCA infarct. Electronically Signed   By: 06/19/2020 M.D.   On: 07/30/2020 19:01    Cardiac Studies   2D echo 06/15/2020 IMPRESSIONS   1. Left ventricular ejection fraction, by estimation, is 55 to 60%. The  left ventricle has normal function. The left ventricle has no regional  wall motion abnormalities. There is mild concentric left ventricular  hypertrophy. Left ventricular diastolic  parameters are consistent with Grade I diastolic dysfunction (impaired  relaxation).  2. Right ventricular systolic function is low normal. The right  ventricular size is normal. There is normal pulmonary artery systolic  pressure.  3. The mitral valve is grossly normal. No evidence of mitral valve  regurgitation.  4. The aortic valve is tricuspid. There is mild thickening of the aortic  valve. Aortic valve regurgitation is not visualized. No aortic stenosis is  present.   Comparison(s): No prior Echocardiogram.   Patient Profile     70 y.o. female with a hx of CVA '2019, HLD, HTN and SVT (possible Afib) who is being seen today for the evaluation of Atrial fibrillation with RVR in the setting of sepsis at the request of Dr. 02-12-1983.  Assessment & Plan    1.  Atrial fibrillation with RVR: Not clear during previous admission. Plan was for outpatient monitor. Initially in NSR on admission, but then developed  atrial bigeminy with conversion to Afib RVR then back to SR with bigeminal PACs. Treated with IV metoprolol x2.  -- TSH WNL -- remains on IV heparin, IV metoprolol PRN q3hr>>will add on low dose BB once taking PO -- currently in SR with PACs --  CHA2DS2-VASc Score of at least 7, currently NPO therefore continue with IV heparin for now -- ultimately will transition to Eliquis 5mg  BID -- need to keep K+>5 and Mag>2>>will replete K+ and Mag today  2.  Septic shock: Likely secondary to UTI but exacerbated by atrial fibrillation with RVR.  Initially admitted to internal medicine, but care transferred to PCCM with persistent hypotension and tachycardia. --Noted to be in acute renal failure with hyperkalemia on admission --Elevated lactic acid, procalcitonin 0.18 --Antibiotics per primary --hypotensive this am and started on Phenylephrine and albumin  3.  Altered mental status: History of CVA --CT head shows no acute abnormality with old left MCA infarct  4.  Acute renal failure: On lisinopril prior to admission -- Cr 3.9>> 3.4>>2.64>>1.64>>1.49>>1.11 in the setting of acute sepsis/shock -- Initiated on sodium bicarb drip on admission -- Aggressive IV fluids, avoiding nephrotoxic agents  5. Hyperkalemia/Hypokalemia: -- initially hyperkalemic and now borderline hypokalemia -- K+ 5.5>>3.5>>3.6 -- try to keep K+>4      For questions or updates, please contact [facility] Please consult [email or web address] for contact info under        Signed, , MD  08/01/2020, 8:21 AM

## 2020-08-01 NOTE — Procedures (Signed)
Cortrak  Tube Type:  Cortrak - 43 inches Tube Location:  Right nare Initial Placement:  Stomach Secured by: Bridle Technique Used to Measure Tube Placement:  Documented cm marking at nare/ corner of mouth Cortrak Secured At:  70 cm    Cortrak Tube Team Note:  Consult received to place a Cortrak feeding tube.   No x-ray is required. RN may begin using tube.   If the tube becomes dislodged please keep the tube and contact the Cortrak team at [email or web address] (password [email or web address]) for replacement.  If after hours and replacement cannot be delayed, place a NG tube and confirm placement with an abdominal x-ray.    Ninoska Goswick MS, RD, LDN Please refer to AMION for RD and/or RD on-call/weekend/after hours pager   

## 2020-08-01 NOTE — Plan of Care (Signed)
  Problem: Clinical Measurements: Goal: Ability to maintain clinical measurements within normal limits will improve Outcome: Progressing Goal: Will remain free from infection Outcome: Progressing Goal: Diagnostic test results will improve Outcome: Progressing Goal: Respiratory complications will improve Outcome: Progressing Goal: Cardiovascular complication will be avoided Outcome: Progressing   Problem: Coping: Goal: Level of anxiety will decrease Outcome: Progressing   Problem: Elimination: Goal: Will not experience complications related to bowel motility Outcome: Progressing Goal: Will not experience complications related to urinary retention Outcome: Progressing   Problem: Pain Managment: Goal: General experience of comfort will improve Outcome: Progressing   Problem: Safety: Goal: Ability to remain free from injury will improve Outcome: Progressing   Problem: Skin Integrity: Goal: Risk for impaired skin integrity will decrease Outcome: Progressing   

## 2020-08-01 NOTE — Progress Notes (Signed)
PHARMACY - PHYSICIAN COMMUNICATION CRITICAL VALUE ALERT - BLOOD CULTURE IDENTIFICATION (BCID)  Kristy Jensen is an 70 y.o. female who presented to [facility] on 07/30/2020 with a chief complaint of UTI/AMS.   Assessment:  Bcx 1/4 Staph spp (NOT S. Aureus) - likely contamination  Name of physician (or Provider) Contacted: Phillips Climes, PharmD  Current antibiotics: Aztreonam with stop date on 4/3  Changes to prescribed antibiotics recommended:  No changes - likely contamination and does not require further coverage  Results for orders placed or performed during the hospital encounter of 07/30/20  Blood Culture ID Panel (Reflexed) (Collected: 07/30/2020  1:27 AM)  Result Value Ref Range   Enterococcus faecalis NOT DETECTED NOT DETECTED   Enterococcus Faecium NOT DETECTED NOT DETECTED   Listeria monocytogenes NOT DETECTED NOT DETECTED   Staphylococcus species DETECTED (A) NOT DETECTED   Staphylococcus aureus (BCID) NOT DETECTED NOT DETECTED   Staphylococcus epidermidis NOT DETECTED NOT DETECTED   Staphylococcus lugdunensis NOT DETECTED NOT DETECTED   Streptococcus species NOT DETECTED NOT DETECTED   Streptococcus agalactiae NOT DETECTED NOT DETECTED   Streptococcus pneumoniae NOT DETECTED NOT DETECTED   Streptococcus pyogenes NOT DETECTED NOT DETECTED   A.calcoaceticus-baumannii NOT DETECTED NOT DETECTED   Bacteroides fragilis NOT DETECTED NOT DETECTED   Enterobacterales NOT DETECTED NOT DETECTED   Enterobacter cloacae complex NOT DETECTED NOT DETECTED   Escherichia coli NOT DETECTED NOT DETECTED   Klebsiella aerogenes NOT DETECTED NOT DETECTED   Klebsiella oxytoca NOT DETECTED NOT DETECTED   Klebsiella pneumoniae NOT DETECTED NOT DETECTED   Proteus species NOT DETECTED NOT DETECTED   Salmonella species NOT DETECTED NOT DETECTED   Serratia marcescens NOT DETECTED NOT DETECTED   Haemophilus influenzae NOT DETECTED NOT DETECTED   Neisseria meningitidis NOT DETECTED NOT DETECTED    Pseudomonas aeruginosa NOT DETECTED NOT DETECTED   Stenotrophomonas maltophilia NOT DETECTED NOT DETECTED   Candida albicans NOT DETECTED NOT DETECTED   Candida auris NOT DETECTED NOT DETECTED   Candida glabrata NOT DETECTED NOT DETECTED   Candida krusei NOT DETECTED NOT DETECTED   Candida parapsilosis NOT DETECTED NOT DETECTED   Candida tropicalis NOT DETECTED NOT DETECTED   Cryptococcus neoformans/gattii NOT DETECTED NOT DETECTED    Jennette Kettle 08/01/2020  2:20 PM

## 2020-08-01 NOTE — Progress Notes (Signed)
eLink Physician-Brief Progress Note Patient Name: Kristy Jensen DOB: [birth year] MRN: [id number]   Date of Service  08/01/2020  HPI/Events of Note  Hypotension - BP = 84/50 with MAP = 62. LVEF = 55-60%. No CVL or CVP.  eICU Interventions  Plan: 1. 25% Albumin 25 gm IV now. 2. Phenylephrine IV infusion via PIV. Titrate to MAP >= 65     Intervention Category Major Interventions: Hypotension - evaluation and management  [name] 08/01/2020, 2:34 AM

## 2020-08-01 NOTE — Progress Notes (Signed)
ANTICOAGULATION CONSULT NOTE  Pharmacy Consult for IV Heparin Indication: atrial fibrillation  Allergies  Allergen Reactions  . Penicillins Swelling and Other (See Comments)    Throat swelling/ turned red  . Sulfa Antibiotics Swelling and Other (See Comments)    Throat swelling/turned red    Patient Measurements: Height: 5\' 9"  (175.3 cm) Weight: 96 kg (211 lb 10.3 oz) IBW/kg (Calculated) : 66.2 Heparin Dosing Weight: 87 kg   Vital Signs: Temp: 97.7 F (36.5 C) (04/01 0325) Temp Source: Axillary (04/01 0325) BP: 92/71 (04/01 0600) Pulse Rate: 71 (04/01 0600)  Labs: Recent Labs    07/30/20 0059 07/30/20 0059 07/30/20 0517 07/30/20 0735 07/30/20 1119 07/30/20 1124 07/30/20 1642 07/30/20 2151 07/31/20 0354 07/31/20 0924 07/31/20 1509 08/01/20 0501  HGB 15.1*  --  13.3  --   --  11.2*  --   --  11.4*  --   --  10.8*  HCT 48.2*  --  45.7  --   --  33.0*  --   --  36.2  --   --  36.0  PLT 247  --  185  --   --   --   --   --  159  --   --  151  APTT 22*  --   --   --   --   --   --   --   --   --   --   --   LABPROT 16.3*  --   --   --   --   --   --   --   --   --   --   --   INR 1.4*  --   --   --   --   --   --   --   --   --   --   --   HEPARINUNFRC  --   --   --   --   --   --  0.49  --  0.65  --   --  0.58  CREATININE 3.90*  --  3.43*  --    < >  --   --  1.88* 1.64*  --  1.49* 1.11*  TROPONINIHS  --    < > 77* 115*  --   --   --  202*  --  149*  --   --    < > = values in this interval not displayed.    Estimated Creatinine Clearance: 59 mL/min (A) (by C-G formula based on SCr of 1.11 mg/dL (H)).  Assessment: 44 YOF presented with severe sepsis and was found to be in atrial fibrillation with RVR.  Recently admission for CVA and discharged on ASA and Plavix as there was no finding of Afib.  Pharmacy was consulted to dose IV heparin.  Heparin level therapeutic; no bleeding reported.  Goal of Therapy:  Heparin level 0.3-0.7 units/ml Monitor platelets by  anticoagulation protocol: Yes   Plan:  Reduce heparin slightly to 1100 units/hr Daily heparin level and CBC F/u with switching to DOAC  Jeremaine Maraj D. 06-09-1982, PharmD, BCPS, BCCCP 08/01/2020, 7:18 AM

## 2020-08-01 NOTE — Progress Notes (Addendum)
NAME:  Kristy Jensen, MRN:  [id number], DOB:  06/25/1950, LOS: 2 ADMISSION DATE:  07/30/2020, CONSULTATION DATE: 07/30/2020 REFERRING MD: [facility], CHIEF COMPLAINT: Altered mental status hypotension  History of Present Illness:  Frail-appearing 70 year old female with a history of CVA, urinary tract infections, atrial fibrillation ventricular response and SVT.  She was discharged in February 2022 urinary tract infection and altered mental status.  Now presents with altered mental status atrial fibrillation + RVR.  Further noted to have acute renal failure, hyper kalemia and hyper natremia.  Pulmonary critical care was asked to come to the bedside due to hypotension and rapid ventricular rate in the setting of atrial fibrillation.  She is poorly responsive.  She is in acute renal failure with creatinine 3.43   Pertinent  Medical History   Past Medical History:  Diagnosis Date  . Hyperlipidemia   . Prediabetes   . Stroke [facility])      Significant Hospital Events: Including procedures, antibiotic start and stop dates in addition to other pertinent events   . 07/30/2020 admit to the intensive care unit  Interim History / Subjective:   Hypotensive overnight, started on pressors  Objective   Blood pressure (!) 114/54, pulse 65, temperature 97.7 F (36.5 C), temperature source Axillary, resp. rate 12, height 5\' 9"  (1.753 m), weight 96 kg, SpO2 100 %.        Intake/Output Summary (Last 24 hours) at 08/01/2020 0859 Last data filed at 08/01/2020 0800 Gross per 24 hour  Intake 4220.34 ml  Output 1250 ml  Net 2970.34 ml   Filed Weights   07/30/20 0056 07/31/20 0500  Weight: 99.8 kg 96 kg   Physical Exam: General: Appears older than stated age, lethargic no acute distress HENT: Crusted blood around roof of mouth, no active bleeding Eyes: EOMI, no scleral icterus Respiratory: No accessory muscle use, clear breath sounds bilateral Cardiovascular: RRR, -M/R/G, no JVD GI: BS+, soft,  nontender Extremities:-Edema,-tenderness Neuro: Somnolent but opens eyes and follows one-step commands, moves left upper extremity less  Labs/imaging that I havepersonally reviewed  (right click and "Reselect all SmartList Selections" daily)  BCx 3/30 > ng UCX 3/30 > insig growth  Na 152 >> 148 K 3.6 BUN 96>51 Cr 3.1 >> 1.1 Phos 1.9  Resolved Hospital Problem list   Shock - resolved with fluid resuscitation, no pressors  Assessment & Plan:   Septic shock secondary to presumed UTI - improving BP, lactate cleared -S/p 11L since admission  -Continue aztreonam x 5 to 7 days. DC Vanc and flagyl -Cultures negative so far   Acute toxic metabolic encephalopathy  Hx CVA ,recent admission 06/27/20 for right basal ganglia infarct secondary to likely small vessel disease and prior history of left parietal embolic stroke  - increased right basal ganglia infarct compared to 2/15 MRI , not enough to explain persistent confusion -Seen by stroke team  -Attributed to multifactorial, hyponatremia sepsis etc.  Dysphagia -now worse with poor mental status but longstanding. Place core track   Hypernatremia , improving --Decrease D5W to 50/hour  Atrial fibrillation - RVR resolved. Currently NSR Demand ischemia -Cardiology -Heparin gtt >> ultimately transition to Eliquis 5 twice daily -Goal K >4 and Mg >2  Hypokalemia Hypophos -Replete K-Phos  Acute renal failure -improving -Monitor UOP/Cr -Avoid nephrotoxins -Trend BMET  Goiter cannot rule out some type of cancerous process. TSH wnl -CT of the neck when stable   GOC - D/w 2 daughters & son 3/15 on the phone, ct full code status for now,  remains to be seen whether she will rebound from this illness in terms of swallowing & PT Will involve palliative care for goals of care discussion  Best practice (right click and "Reselect all SmartList Selections" daily)  Diet:  NPO Pain/Anxiety/Delirium protocol (if indicated): No VAP  protocol (if indicated): Not indicated DVT prophylaxis: Systemic AC GI prophylaxis: N/A and PPI Glucose control:  SSI No Central venous access:  N/A Arterial line:  N/A Foley:  N/A Mobility:  bed rest  PT consulted: N/A Last date of multidisciplinary goals of care discussion [not as of yet] Code Status:  full code, needs to be addressed. Disposition: ICU , can transfer to stepdown unit and to [facility] 4/2  Labs   CBC: Recent Labs  Lab 07/30/20 0059 07/30/20 0517 07/30/20 1124 07/31/20 0354 08/01/20 0501  WBC 19.8* 14.4*  --  9.6 6.1  NEUTROABS 17.1* 11.9*  --   --   --   HGB 15.1* 13.3 11.2* 11.4* 10.8*  HCT 48.2* 45.7 33.0* 36.2 36.0  MCV 90.9 97.0  --  90.0 92.5  PLT 247 185  --  159 151    Basic Metabolic Panel: Recent Labs  Lab 07/30/20 1119 07/30/20 1124 07/30/20 2151 07/31/20 0354 07/31/20 1509 07/31/20 1945 08/01/20 0501  NA 152* 152* 150* 152* 152*  --  148*  K 4.4 4.4 3.3* 3.5 3.8  --  3.6  CL 119*  --  116* 118* 118*  --  115*  CO2 18*  --  26 26 23   --  22  GLUCOSE 118*  --  169* 153* 154*  --  124*  BUN 115*  --  96* 85* 71*  --  51*  CREATININE 2.64*  --  1.88* 1.64* 1.49*  --  1.11*  CALCIUM 8.3*  --  7.8* 8.1* 8.2*  --  8.0*  MG  --   --  1.6* 2.0  --  1.9 1.8  PHOS  --   --   --  2.2*  --  2.9 1.9*   GFR: Estimated Creatinine Clearance: 59 mL/min (A) (by C-G formula based on SCr of 1.11 mg/dL (H)). Recent Labs  Lab 07/30/20 0059 07/30/20 0127 07/30/20 0517 07/30/20 0522 07/30/20 0735 07/30/20 08/01/20 07/30/20 1119 07/30/20 2151 07/31/20 0354 07/31/20 0924 08/01/20 0501  PROCALCITON  --   --  <0.10  --   --  0.18  --   --   --   --   --   WBC 19.8*  --  14.4*  --   --   --   --   --  9.6  --  6.1  LATICACIDVEN  --    < >  --    < > 4.1*  --  3.0* 2.2*  --  2.1*  --    < > = values in this interval not displayed.    Liver Function Tests: Recent Labs  Lab 07/30/20 0059 07/30/20 0517  AST 15 17  ALT 10 6  ALKPHOS 71 65  BILITOT 0.9  0.8  PROT 6.7 5.8*  ALBUMIN 3.3* 2.7*   Recent Labs  Lab 07/30/20 0837 07/31/20 0924  LIPASE 145* 69*  AMYLASE 280*  --    No results for input(s): AMMONIA in the last 168 hours.  ABG    Component Value Date/Time   HCO3 17.8 (L) 07/30/2020 1124   TCO2 19 (L) 07/30/2020 1124   ACIDBASEDEF 8.0 (H) 07/30/2020 1124   O2SAT 51.0 07/30/2020 1124  Coagulation Profile: Recent Labs  Lab 07/30/20 0059  INR 1.4*    Cardiac Enzymes: No results for input(s): CKTOTAL, CKMB, CKMBINDEX, TROPONINI in the last 168 hours.  HbA1C: Hgb A1c MFr Bld  Date/Time Value Ref Range Status  06/14/2020 03:37 AM 6.0 (H) 4.8 - 5.6 % Final    Comment:    (NOTE) Pre diabetes:          5.7%-6.4%  Diabetes:              >6.4%  Glycemic control for   <7.0% adults with diabetes     CBG: Recent Labs  Lab 07/31/20 1508 07/31/20 1905 07/31/20 2323 08/01/20 0325 08/01/20 0712  GLUCAP 138* 126* 118* 114* 91    Critical care time: 32 min     The patient is critically ill with multiple organ systems failure and requires high complexity decision making for assessment and support, frequent evaluation and titration of therapies, application of advanced monitoring technologies and extensive interpretation of multiple databases.  Independent Critical Care Time: 45 Minutes.    08/01/2020 8:59 AM   Please see Amion for pager number to reach on-call Pulmonary and Critical Care Team.

## 2020-08-01 NOTE — Progress Notes (Addendum)
  Speech Language Pathology Treatment: Dysphagia  Patient Details Name: Kristy Jensen MRN: [id number] DOB: 06/10/1950 Today's Date: 08/01/2020 Time: 1015-1030 SLP Time Calculation (min) (ACUTE ONLY): 15.13 min  Assessment / Plan / Recommendation Clinical Impression  Pt was seen for dysphagia treatment. She received oral care immediately prior to the session. Pt was alert and cooperative during the session, but soon fell asleep once auditory/tactile stimuli were removed. Pt demonstrated some improvement in bolus awareness with reduced need for cueing for mastication/bolus manipulation. However, this remains impaired. Swallowing was inconsistent and SLP suspects reduced hyolaryngeal movement as well as a pharyngeal delay. A Cortrak was placed this morning and SLP is in agreement in plan for short-term nonoral alimentation. Pt may have individual ice chips following oral care. Pt's daughter arrived towards the end of the session and was educated regarding the pt's deficits, her progress, and recommendations. She verbalized understanding. SLP will continue to follow pt.    HPI HPI: Pt is a 70 year old female with a history of CVA, urinary tract infections, atrial fibrillation ventricular response and SVT.  She was discharged in February 2022 following urinary tract infection and altered mental status. She presented to the ED on 3/30 with altered mental status  and ventricular heart rate with atrial fibrillation. 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 MRI brain completed due to encephalopathy on 3/30: Increased size of acute to subacute infarcts of the right basal ganglia. CXR 3/30: Stable bibasilar pulmonary fibrotic changes.      SLP Plan  Continue with current plan of care       Recommendations  Diet recommendations: NPO (pt may have ice chips after oral care if adequately alert) Medication Administration: Via alternative means                Oral  Care Recommendations: Oral care QID;Oral care prior to ice chip/H20 Follow up Recommendations:  (TBD) SLP Visit Diagnosis: Dysphagia, unspecified (R13.10) Plan: Continue with current plan of care       Robben Jagiello I. Vear Clock, MS, CCC-SLP Acute Rehabilitation Services Office number [phone] Pager [phone]                Scheryl Marten 08/01/2020, 10:39 AM

## 2020-08-02 DIAGNOSIS — I6300 Cerebral infarction due to thrombosis of unspecified precerebral artery: Secondary | ICD-10-CM | POA: Diagnosis not present

## 2020-08-02 DIAGNOSIS — E440 Moderate protein-calorie malnutrition: Secondary | ICD-10-CM | POA: Diagnosis not present

## 2020-08-02 DIAGNOSIS — N170 Acute kidney failure with tubular necrosis: Secondary | ICD-10-CM | POA: Diagnosis not present

## 2020-08-02 DIAGNOSIS — A021 Salmonella sepsis: Secondary | ICD-10-CM | POA: Diagnosis not present

## 2020-08-02 DIAGNOSIS — N179 Acute kidney failure, unspecified: Secondary | ICD-10-CM | POA: Diagnosis not present

## 2020-08-02 DIAGNOSIS — I471 Supraventricular tachycardia: Secondary | ICD-10-CM | POA: Diagnosis not present

## 2020-08-02 DIAGNOSIS — G6281 Critical illness polyneuropathy: Secondary | ICD-10-CM | POA: Diagnosis not present

## 2020-08-02 DIAGNOSIS — R6520 Severe sepsis without septic shock: Secondary | ICD-10-CM

## 2020-08-02 DIAGNOSIS — N390 Urinary tract infection, site not specified: Secondary | ICD-10-CM | POA: Diagnosis not present

## 2020-08-02 DIAGNOSIS — E878 Other disorders of electrolyte and fluid balance, not elsewhere classified: Secondary | ICD-10-CM

## 2020-08-02 DIAGNOSIS — R1310 Dysphagia, unspecified: Secondary | ICD-10-CM

## 2020-08-02 DIAGNOSIS — Z7189 Other specified counseling: Secondary | ICD-10-CM

## 2020-08-02 LAB — BASIC METABOLIC PANEL
Anion gap: 7 (ref 5–15)
BUN: 30 mg/dL — ABNORMAL HIGH (ref 8–23)
CO2: 22 mmol/L (ref 22–32)
Calcium: 7.7 mg/dL — ABNORMAL LOW (ref 8.9–10.3)
Chloride: 113 mmol/L — ABNORMAL HIGH (ref 98–111)
Creatinine, Ser: 0.93 mg/dL (ref 0.44–1.00)
GFR, Estimated: >60 mL/min (ref >60)
Glucose, Bld: 161 mg/dL — ABNORMAL HIGH (ref 70–99)
Potassium: 3.3 mmol/L — ABNORMAL LOW (ref 3.5–5.1)
Sodium: 142 mmol/L (ref 135–145)

## 2020-08-02 LAB — GLUCOSE, CAPILLARY
Glucose-Capillary: 160 mg/dL — ABNORMAL HIGH (ref 70–99)
Glucose-Capillary: 200 mg/dL — ABNORMAL HIGH (ref 70–99)
Glucose-Capillary: 173 mg/dL — ABNORMAL HIGH (ref 70–99)
Glucose-Capillary: 133 mg/dL — ABNORMAL HIGH (ref 70–99)
Glucose-Capillary: 184 mg/dL — ABNORMAL HIGH (ref 70–99)

## 2020-08-02 LAB — CBC
HCT: 32.6 % — ABNORMAL LOW (ref 36.0–46.0)
Hemoglobin: 10.2 g/dL — ABNORMAL LOW (ref 12.0–15.0)
MCH: 28.2 pg (ref 26.0–34.0)
MCHC: 31.3 g/dL (ref 30.0–36.0)
MCV: 90.1 fL (ref 80.0–100.0)
Platelets: 145 10*3/uL — ABNORMAL LOW (ref 150–400)
RBC: 3.62 MIL/uL — ABNORMAL LOW (ref 3.87–5.11)
RDW: 15 % (ref 11.5–15.5)
WBC: 5.3 10*3/uL (ref 4.0–10.5)
nRBC: 0 % (ref 0.0–0.2)

## 2020-08-02 LAB — PHOSPHORUS: Phosphorus: 2.2 mg/dL — ABNORMAL LOW (ref 2.5–4.6)

## 2020-08-02 LAB — MAGNESIUM: Magnesium: 1.5 mg/dL — ABNORMAL LOW (ref 1.7–2.4)

## 2020-08-02 LAB — HEPARIN LEVEL (UNFRACTIONATED): Heparin Unfractionated: 0.32 IU/mL (ref 0.30–0.70)

## 2020-08-02 MED ORDER — SODIUM CHLORIDE 0.9 % IV SOLN
1.0000 g | Freq: Three times a day (TID) | INTRAVENOUS | Status: AC
  Administered 2020-08-02 – 2020-08-03 (×5): 1 g via INTRAVENOUS
  Filled 2020-08-02 (×5): qty 1

## 2020-08-02 MED ORDER — MAGNESIUM SULFATE 2 GM/50ML IV SOLN
2.0000 g | Freq: Once | INTRAVENOUS | Status: AC
  Administered 2020-08-03: 2 g via INTRAVENOUS
  Filled 2020-08-02: qty 50

## 2020-08-02 MED ORDER — FREE WATER
100.0000 mL | Freq: Three times a day (TID) | Status: DC
  Administered 2020-08-02 – 2020-08-06 (×13): 100 mL

## 2020-08-02 MED ORDER — METOPROLOL TARTRATE 5 MG/5ML IV SOLN: 5.0000 mg | Freq: Once | INTRAVENOUS | Status: AC

## 2020-08-02 MED ORDER — POTASSIUM PHOSPHATES 15 MMOLE/5ML IV SOLN
20.0000 mmol | Freq: Once | INTRAVENOUS | Status: AC
  Administered 2020-08-02: 20 mmol via INTRAVENOUS
  Filled 2020-08-02: qty 6.67

## 2020-08-02 MED ORDER — METOPROLOL TARTRATE 5 MG/5ML IV SOLN
INTRAVENOUS | Status: AC
  Administered 2020-08-02: 5 mg via INTRAVENOUS
  Filled 2020-08-02: qty 5

## 2020-08-02 MED ORDER — MAGNESIUM SULFATE 2 GM/50ML IV SOLN
2.0000 g | Freq: Once | INTRAVENOUS | Status: AC
  Administered 2020-08-02: 2 g via INTRAVENOUS
  Filled 2020-08-02: qty 50

## 2020-08-02 MED ORDER — INSULIN ASPART 100 UNIT/ML ~~LOC~~ SOLN
0.0000 [IU] | SUBCUTANEOUS | Status: DC
  Administered 2020-08-02 – 2020-08-05 (×15): 1 [IU] via SUBCUTANEOUS
  Administered 2020-08-05: 2 [IU] via SUBCUTANEOUS
  Administered 2020-08-05: 1 [IU] via SUBCUTANEOUS

## 2020-08-02 MED ORDER — SODIUM CHLORIDE 0.9 % IV SOLN
1.0000 g | Freq: Three times a day (TID) | INTRAVENOUS | Status: DC
  Filled 2020-08-02: qty 1

## 2020-08-02 NOTE — Consult Note (Signed)
WOC Nurse Consult Note: Reason for Consult: Two partial thickness open areas on sacrum and left buttock at gluteal cleft (POA) Wound type: Pressure Injury POA: Yes/No/NA Measurement:per nursing notation on Nursing Flow Sheet on 07/30/20 Wound ITG:PQDI, moist Drainage (amount, consistency, odor) scant serous Periwound: intact, moist Dressing procedure/placement/frequency: Will add topical care guidance for the two open areas using xeroform gauze and covering with a silicone foam. Patient to be turned and repositioned off of the supine position as able/tolerated. Note that patient's family may decide on comfort care status when some returns from out of country trip on Wednesday (see note from Palliative Care this afternoon).  WOC Nursing has added Network engineer.  WOC nursing team will not follow, but will remain available to this patient, the nursing and medical teams.  Please re-consult if needed. Thanks, Ladona Mow, MSN, RN, GNP, [place]  Pager# [phone]

## 2020-08-02 NOTE — Progress Notes (Signed)
ANTICOAGULATION CONSULT NOTE  Pharmacy Consult for IV Heparin Indication: atrial fibrillation  Allergies  Allergen Reactions  . Penicillins Swelling and Other (See Comments)    Throat swelling/ turned red  . Sulfa Antibiotics Swelling and Other (See Comments)    Throat swelling/turned red    Patient Measurements: Height: 5\' 9"  (175.3 cm) Weight: 98.1 kg (216 lb 4.3 oz) IBW/kg (Calculated) : 66.2 Heparin Dosing Weight: 87 kg   Vital Signs: Temp: 98.5 F (36.9 C) (04/01 2156) Temp Source: Oral (04/01 2156) BP: 126/60 (04/01 2156) Pulse Rate: 68 (04/01 2156)  Labs: Recent Labs    07/30/20 2151 07/31/20 0354 07/31/20 0924 07/31/20 1509 08/01/20 0501 08/02/20 0048  HGB  --  11.4*  --   --  10.8* 10.2*  HCT  --  36.2  --   --  36.0 32.6*  PLT  --  159  --   --  151 145*  HEPARINUNFRC  --  0.65  --   --  0.58 0.32  CREATININE 1.88* 1.64*  --  1.49* 1.11* 0.93  TROPONINIHS 202*  --  149*  --   --   --     Estimated Creatinine Clearance: 71.2 mL/min (by C-G formula based on SCr of 0.93 mg/dL).  Assessment: 58 YOF presented with severe sepsis and was found to be in atrial fibrillation with RVR.  Recently admission for CVA and discharged on ASA and Plavix as there was no finding of Afib.  Pharmacy was consulted to dose IV heparin.  Spoke with RN who reported no issues with the infusion or IV line and no signs of bleeding observed from patient.   Heparin level therapeutic 0.32 ; no bleeding reported.  Goal of Therapy:  Heparin level 0.3-0.7 units/ml Monitor platelets by anticoagulation protocol: Yes   Plan:  Increase heparin slightly to 1150 units/hr Daily heparin level and CBC F/u long term anticoagulation plans  78, PharmD PGY1 Pharmacy Resident 08/02/2020 7:46 AM

## 2020-08-02 NOTE — Plan of Care (Signed)

## 2020-08-02 NOTE — Progress Notes (Signed)
Tele called stated HR went to 196. Doing an EKG b/p 120/95 HR 202. No c/o pain.

## 2020-08-02 NOTE — Progress Notes (Addendum)
[facility] night shift.  The nursing staff informed me that the patient was having tachycardia with her heart rate over 200 bpm.  She was mildly anxious, but denied chest pain, dyspnea, diaphoresis, dizziness or nausea.  Vital signs were HR-98, SBP-140s, RR-16, SpO2-99 on RA  General: In NAD.  Mild to moderate generalized weakness. HEENT: NGT in place. Neck: Supple, no JVD. Lungs: CTA bilaterally. Cardiovascular: S1-S2, tachycardic in the 200s, no murmurs. Abdomen: Soft, no distention. Extremities: No edema. Neuro: Grossly nonfocal.  Assessment/plan: SVT Responded to adenosine 6 mg IVP. Metoprolol 5 mg IVP x1. Magnesium sulfate 2 g IVPB. Check troponin and potassium level. Replace potassium as needed. Discussed with cardiology on-call (Dr. Liane Comber). Cardiology recommended resuming metoprolol and optimization of electrolytes.  Sanda Klein, MD.  I have 45 minutes of critical care time were spent during the process of these emergent event.  This document was prepared using Dragon voice recognition software and may contain some unintended transcription errors.

## 2020-08-02 NOTE — Progress Notes (Signed)
[facility] night shift.  The nursing staff reports that the patient's potassium, calcium, magnesium and phosphorus were low on today's lab results.  Potassium is 3.4 mmol/L.  Calcium was 7.7, magnesium 1.5 and phosphorus 2.2 mg/dL.  There is no albumin level available at this time.  I will add it on since the patient was hypoalbuminemic at 2.7 g/dL with a calcium level of 8.9 mg/dL 3 days ago.  The patient is n.p.o. so we will order parental replacement.  Magnesium sulfate 2 g IVPB and K-Phos 20 mmol IVPB x1 dose for hypokalemia/hypophosphatemia ordered.  Sanda Klein, MD.

## 2020-08-02 NOTE — Progress Notes (Signed)
Pharmacy Antibiotic Note  Kristy Jensen is a 70 y.o. female admitted on 07/30/2020 with sepsis.  Pharmacy has been consulted for Aztreonam dosing. Pt with allergy (tongue swelling) to PCN and no h/o receiving cephalosporins in the past. This is now day 4 of therapy with aztreonam. Pt remains afebrile with WBC WNL. Stop date is entered for 08/03/2020.  Scr 0.93, CrCl 71.23ml/min   Plan: Continue aztreonam 1gm IV q8h Continue to monitor pt clinical progression, WBC, renal function   Height: 5\' 9"  (175.3 cm) Weight: 98.1 kg (216 lb 4.3 oz) IBW/kg (Calculated) : 66.2  Temp (24hrs), Avg:98.5 F (36.9 C), Min:97.9 F (36.6 C), Max:98.9 F (37.2 C)  Recent Labs  Lab 07/30/20 0059 07/30/20 0127 07/30/20 0517 07/30/20 0522 07/30/20 0735 07/30/20 1119 07/30/20 2151 07/31/20 0354 07/31/20 0924 07/31/20 1509 08/01/20 0501 08/02/20 0048  WBC 19.8*  --  14.4*  --   --   --   --  9.6  --   --  6.1 5.3  CREATININE 3.90*  --  3.43*  --   --  2.64* 1.88* 1.64*  --  1.49* 1.11* 0.93  LATICACIDVEN  --    < >  --  2.0* 4.1* 3.0* 2.2*  --  2.1*  --   --   --    < > = values in this interval not displayed.    Estimated Creatinine Clearance: 71.2 mL/min (by C-G formula based on SCr of 0.93 mg/dL).    Allergies  Allergen Reactions  . Penicillins Swelling and Other (See Comments)    Throat swelling/ turned red  . Sulfa Antibiotics Swelling and Other (See Comments)    Throat swelling/turned red    Antimicrobials this admission: 3/30 Vanc >>3/31 3/30 flagyl >> 3/31  3/30 Aztreonam >> (4/3 stop date entered)  Microbiology results: 3/30 BCx:  1/4 staph aureus -- likely contaminant 3/30 UCx:   <10K CFU  3/30 MRSA PCR: negative  Thank you for allowing pharmacy to be a part of this patient's care.  4/30, PharmD PGY1 Pharmacy Resident 08/02/2020 7:42 AM

## 2020-08-02 NOTE — Progress Notes (Signed)
   Plan in place per Dr. Norris Cross note, reviewed. When able to take p.o., Eliquis, p.o. metoprolol  No new recs. Will sign off. Please call if ?  Donato Schultz, MD

## 2020-08-02 NOTE — Evaluation (Addendum)
Occupational Therapy Evaluation Patient Details Name: Kristy Jensen MRN: [id number] DOB: 1950-06-14 Today's Date: 08/02/2020    History of Present Illness 70 y.o. female with history of CVA, PAF, HLD, HTN presenting to the hospital 07/30/20 with altered mental status, A. fib with RVR, hyperkalemia and septic shock. 3/30  MRI brain: Increased size of acute to subacute infarcts of the right basal ganglia.   Clinical Impression   PT admitted with AMS and septic shock. Pt currently with functional limitiations due to the deficits listed below (see OT problem list). Pt currently total +2 (A) for all adls and mobility. Pt high risk for skin break down and recommend air mattress overlay for skin integrity.  Pt will benefit from skilled OT to increase their independence and safety with adls and balance to allow discharge SNF.     Follow Up Recommendations  SNF    Equipment Recommendations  Wheelchair (measurements OT);Wheelchair cushion (measurements OT);Hospital bed;Other (comment) (hoyer)    Recommendations for Other Services       Precautions / Restrictions Precautions Precautions: Fall Precaution Comments: cortrak, flexseal, purewick Required Braces or Orthoses: Other Brace (bil mittens)      Mobility Bed Mobility Overal bed mobility: Needs Assistance Bed Mobility: Rolling;Supine to Sit;Sit to Supine Rolling: +2 for physical assistance;Total assist   Supine to sit: +2 for physical assistance;Total assist Sit to supine: +2 for physical assistance;Total assist   General bed mobility comments: pt exiting the bed on R side with HOB elevated to help with pivot on pad helicopter sitting. total (A) for bil LE. pt static sitting minguard to total (A) when pushing posteriorly. pt with flexed posture and posterior pelvic tilt. Pt total +2 total to return to supine with total (A) for bil LE. Pt dependent on staff for all mobility. pt reports comfort L side lying with pillow between knees hob  elevated 30 degrees due to feeding tube    Transfers                 General transfer comment: NT due to poor static sitting with total +2 (A). Recommendation hoyer lift only at this time. Unknown prior to admission transfers    Balance                                           ADL either performed or assessed with clinical judgement   ADL Overall ADL's : Needs assistance/impaired Eating/Feeding: NPO     Grooming Details (indicate cue type and reason): noted to have blood on corner of mout and asking RN staff for vaseline as a chapstick to mouth. RN reports oral care multiple times today to attempt to help the sore areas around mouth and tongue Upper Body Bathing: Total assistance   Lower Body Bathing: Total assistance                         General ADL Comments: pt sitting eob this session with (A) to sustain sitting balance. pt noted to have wound on sacrum and RN present to observe     Vision   Additional Comments: pt occluding R eye and looking with L eye during session supine and sitting. pt denies diplopia. pt with R gaze preference. pt only looking to midline and not past midline. pt not looking at inferior angle ( lower quadrants )     Perception  Praxis      Pertinent Vitals/Pain Pain Assessment: Faces Faces Pain Scale: Hurts a little bit Pain Location: response to painful stimuli in R foot to generate activation Pain Intervention(s): Monitored during session;Repositioned     Hand Dominance Right   Extremity/Trunk Assessment Upper Extremity Assessment Upper Extremity Assessment: RUE deficits/detail;LUE deficits/detail RUE Deficits / Details: reaching for face with R UE and able to sustain shoulder elevation 80 degrees, RUE Coordination: decreased gross motor;decreased fine motor LUE Deficits / Details: pt keeping arm adducted to body and elbow flexed but is able to extend elbow with AAROM and shoulder flexion to 70  degrees. pt reaching for face with L UE at times with neck flexion to meet hand. pt asked to "pull" bicep and instead pt pushing therapist away Triceps LUE Coordination: decreased fine motor;decreased gross motor   Lower Extremity Assessment Lower Extremity Assessment: Defer to PT evaluation;RLE deficits/detail RLE Deficits / Details: not following commands to move R LE. pt with nail bed pressure and states "ouch" and moves Foot back   Cervical / Trunk Assessment Cervical / Trunk Assessment: Kyphotic;Other exceptions (R neck rotation preference, provided PROM L neck rotation and tolerates)   Communication Communication Communication: Expressive difficulties   Cognition Arousal/Alertness: Awake/alert Behavior During Therapy: Flat affect Overall Cognitive Status: Impaired/Different from baseline Area of Impairment: Attention;Memory;Following commands;Safety/judgement;Awareness                   Current Attention Level: Focused Memory: Decreased short-term memory Following Commands: Follows one step commands inconsistently;Follows one step commands with increased time Safety/Judgement: Decreased awareness of safety;Decreased awareness of deficits Awareness: Intellectual   General Comments: Pt able to report last name. pt at times does not response to questioned ask. pt other times responds with delayed response. pt inconsistent in response. pt followed commands for squeeze, release grasp, thumbs up and down supine. pt reports closing R eye because its tired. pt minimal responses   General Comments  noted to have wound on buttock / sacrum area stage 2 or greater. RN present to observe wound. Pt with barrier cream and hygiene performed in side lying. Pt with pressure relief by L side lying. Recommendation for airmattress overlay for decreased risk for skin breakdown due to decr mobility    Exercises     Shoulder Instructions      Home Living Family/patient expects to be  discharged to:: Skilled nursing facility                                        Prior Functioning/Environment          Comments: unknown previous admission 06/2020 noted to be poor historian by staff. reports 06/2020 walking prior to that admission and fall from bed pt was living indep with recent move from [place] due to cognitive decline and oriented x2 at that time baseline per chart. Pt d/c [place] now readmitted 3/30        OT Problem List: Decreased activity tolerance;Impaired balance (sitting and/or standing);Decreased strength;Impaired vision/perception;Decreased coordination;Decreased cognition;Decreased safety awareness;Decreased knowledge of use of DME or AE;Decreased knowledge of precautions;Cardiopulmonary status limiting activity;Obesity;Pain      OT Treatment/Interventions: Self-care/ADL training;Therapeutic exercise;Neuromuscular education;Energy conservation;DME and/or AE instruction;Manual therapy;Modalities;Therapeutic activities;Splinting;Cognitive remediation/compensation;Visual/perceptual remediation/compensation;Patient/family education;Balance training    OT Goals(Current goals can be found in the care plan section) Acute Rehab OT Goals Patient Stated Goal: none stated OT Goal Formulation: Patient unable  to participate in goal setting Time For Goal Achievement: 08/16/20 Potential to Achieve Goals: Good  OT Frequency: Min 2X/week   Barriers to D/C:            Co-evaluation PT/OT/SLP Co-Evaluation/Treatment: Yes Reason for Co-Treatment: Complexity of the patient's impairments (multi-system involvement);Necessary to address cognition/behavior during functional activity;For patient/therapist safety;To address functional/ADL transfers   OT goals addressed during session: ADL's and self-care;Proper use of Adaptive equipment and DME;Strengthening/ROM      AM-PAC OT "6 Clicks" Daily Activity     Outcome Measure Help from another person eating  meals?: Total Help from another person taking care of personal grooming?: Total Help from another person toileting, which includes using toliet, bedpan, or urinal?: Total Help from another person bathing (including washing, rinsing, drying)?: Total Help from another person to put on and taking off regular upper body clothing?: Total Help from another person to put on and taking off regular lower body clothing?: Total 6 Click Score: 6   End of Session Nurse Communication: Mobility status;Precautions;Need for lift equipment  Activity Tolerance: Patient tolerated treatment well Patient left: in bed;with call bell/phone within reach;with bed alarm set;with nursing/sitter in room;with restraints reapplied  OT Visit Diagnosis: Unsteadiness on feet (R26.81);Muscle weakness (generalized) (M62.81);Pain                Time: 8921-1941 OT Time Calculation (min): 28 min Charges:  OT General Charges $OT Visit: 1 Visit   Timmothy Euler, OTR/L  Acute Rehabilitation Services Pager: [phone] Office: [phone] .   Mateo Flow 08/02/2020, 4:02 PM

## 2020-08-02 NOTE — Significant Event (Signed)
Rapid Response Event Note   Reason for Call :  SVT-200s  Pt was given 6mg  adenosine by , RN PTA RRT. Pt HR was NSR-98 on my arrival.  Initial Focused Assessment:  Pt lying in bed, alert and oriented, in no distress. Pt denies chest pain/SOB. Lungs clear, diminished in the bases. Skin warm and dry.  HR-98, SBP-140s, RR-16, SpO2-99 on RA  Interventions:  Done PTA RRT:  EKG 6mg  adenosine given IV   Ordered after my arrival: 5mg  metoprolol IV x 1 2g Mg IVPB x 1 BMP/Mg/CK/CKMB/Trop/Albumin  Plan of Care:  Pt on Toprol XL 25mg  at home however is not getting inpatient. One time dose metoprolol ordered as well as magnesium repletion. Give metoprolol and monitor response. Await lab results.  Continue to monitor pt closely. Call RRT if further assistance needed.   Event Summary:   MD Notified: Dr. 04-02-1993 at bedside on my arrival Call Time:2331 Arrival End Time:0005  , RN

## 2020-08-02 NOTE — Consult Note (Signed)
Consultation Note Date: 08/02/2020   Patient Name: Kristy Jensen  DOB: 16-Jan-1951  MRN: [id number]  Age / Sex: 70 y.o., female  PCP: Patient, No Pcp Per (Inactive) Referring Physician: Maretta Bees, MD  Reason for Consultation: Establishing goals of care  HPI/Patient Profile: 69 y.o. female  with past medical history of CVA, UTIs, Afib with SVT recently admitted and discharge February with UTI and AMS-  admitted on 07/30/2020 with alterend mental status, a fib and RVR. Additionally with acute kidney failure, hyperkalemia, hypernatremia. Admission complicated by sepsis from UTI and MRI on 3/30 indicating increased size in acute CVA. SLP eval indicates difficulty following commands, unable to demonstrate volitional swallow. Palliative medicine consulted GOC.  Clinical Assessment and Goals of Care:  I have reviewed medical records including EPIC notes, labs and imaging, examined the patient and spoke via phone with daughter- Kristy Jensen to discuss diagnosis prognosis, GOC, EOL wishes, disposition and options.  We discussed a brief life review of the patient. 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 She previously had the ability to speak many many languages- was fluent in [place] at 70 years old. She is known for her big personality.   As far as functional and nutritional status- she has declined significantly. She is bed bound.    We discussed her current illness and what it means in the larger context of her on-going co-morbidities.  Natural disease trajectory and expectations at EOL were discussed.   Patient has very clear advanced directives that state the following: if patient due to "illness, disease, injury or mental deterioration" becomes "(a) unable to take part in decisions for my own future and medical care; and (b) In the opinion of my attending physician, my condition becomes  such that: ....... 4. There is no reasonable expectation of my recovering or regaining a meaningful quality of life...." then she prefers that treatment be limited to "comfort measures only".  Discussed with Kristy Jensen what patient's definition of meaningful recovery to quality of life would be- Kristy Jensen states that patient would want to be able to communicate fully, eat on her own, and not be bed bound.   I discussed with her concerns that patient may not be able to return to that stated definition of quality of life.   The difference between ongoing life sustaining medical care and comfort care was discussed and considered in light of the patient's goals of care.   Advanced directives, concepts specific to code status, artifical feeding and hydration, and rehospitalization were considered and discussed.  Hospice and Palliative Care services outpatient were explained and offered.  Questions and concerns were addressed.  The family was encouraged to call with questions or concerns.   Primary Decision Maker NEXT OF KIN- patient's children    SUMMARY OF RECOMMENDATIONS -Patient would now want permanent feeding tube -Kristy Jensen is going to discuss transition to full comfort measures only with her brother and sister- she feels that all are leaning in that direction -Patient would be appropriate for residential Hospice if  decision made for transition to comfort measures only -Code status - Kristy Jensen agreeable to DNR- but needs to discuss with siblings -PMT contact information given- will followup if hear from Kristy Jensen today- otherwise PMT member will followup with Kristy Jensen tomorrow    Addendum - conference call held with all patient's children and sister Plan to continue current interventions until Kristy Jensen can return from [place] on Tuesday evening for further GOC discussion -Everyone is in agreement wit DNR   -Plan made to meet with Kristy Jensen on Wednesday at 4pm- PMT will follow peripherally and assist as needed    Code Status/Advance Care Planning:  Full code  Prognosis:    Unable to determine- if transitioned to full comfort measures only would be less than two weeks  Discharge Planning: To Be Determined  Primary Diagnoses: Present on Admission: . Sepsis (HCC) . ARF (acute renal failure) (HCC) . Metabolic encephalopathy . Acute lower UTI . CVA (cerebral vascular accident) (HCC) . Shock (HCC)   I have reviewed the medical record, interviewed the patient and family, and examined the patient. The following aspects are pertinent.  Past Medical History:  Diagnosis Date  . Hyperlipidemia   . Prediabetes   . Stroke [facility])    Social History   Socioeconomic History  . Marital status: Divorced    Spouse name: Not on file  . Number of children: Not on file  . Years of education: Not on file  . Highest education level: Not on file  Occupational History  . Not on file  Tobacco Use  . Smoking status: Never Smoker  . Smokeless tobacco: Never Used  Substance and Sexual Activity  . Alcohol use: Yes    Comment: socially  . Drug use: Never  . Sexual activity: Not on file  Other Topics Concern  . Not on file  Social History Narrative  . Not on file   Social Determinants of Health   Financial Resource Strain: Not on file  Food Insecurity: Not on file  Transportation Needs: Not on file  Physical Activity: Not on file  Stress: Not on file  Social Connections: Not on file   Scheduled Meds: . chlorhexidine  15 mL Mouth Rinse BID  . Chlorhexidine Gluconate Cloth  6 each Topical Daily  . free water  100 mL Per Tube Q8H  . insulin aspart  0-6 Units Subcutaneous Q4H  . mouth rinse  15 mL Mouth Rinse q12n4p   Continuous Infusions: . sodium chloride 10 mL/hr at 08/02/20 0308  . sodium chloride    . aztreonam 1 g (08/02/20 1028)  . feeding supplement (OSMOLITE 1.2 CAL) 1,000 mL (08/02/20 1153)  . heparin 1,150 Units/hr (08/02/20 0929)   PRN Meds:.sodium chloride, acetaminophen  **OR** acetaminophen, docusate sodium, polyethylene glycol Medications Prior to Admission:  Prior to Admission medications   Medication Sig Start Date End Date Taking? Authorizing Provider  fosinopril (MONOPRIL) 40 MG tablet Take 40 mg by mouth daily.   Yes [provider]  metFORMIN (GLUCOPHAGE) 500 MG tablet Take 1 tablet (500 mg total) by mouth 2 (two) times daily with a meal. 06/19/20 07/19/20 Yes Pahwani, Daleen Bo, MD  metoprolol succinate (TOPROL-XL) 25 MG 24 hr tablet Take 25 mg by mouth daily. 12/21/19  Yes [provider]  atorvastatin (LIPITOR) 80 MG tablet Take 1 tablet (80 mg total) by mouth daily at 6 PM. 06/19/20 07/19/20  Hughie Closs, MD  FLUoxetine (PROZAC) 10 MG capsule Take 1 capsule (10 mg total) by mouth daily. 06/19/20 07/19/20  Pahwani,  Daleen Bo, MD   Allergies  Allergen Reactions  . Penicillins Swelling and Other (See Comments)    Throat swelling/ turned red  . Sulfa Antibiotics Swelling and Other (See Comments)    Throat swelling/turned red   Review of Systems  Physical Exam  Vital Signs: BP (!) 125/56 (BP Location: Left Arm)   Pulse 86   Temp 98.9 F (37.2 C) (Axillary)   Resp 15   Ht 5\' 9"  (1.753 m)   Wt 98.1 kg   SpO2 98%   BMI 31.94 kg/m  Pain Scale: 0-10 POSS *See Group Information*: 1-Acceptable,Awake and alert Pain Score: 0-No pain   SpO2: SpO2: 98 % O2 Device:SpO2: 98 % O2 Flow Rate: .O2 Flow Rate (L/min): 2 L/min  IO: Intake/output summary:   Intake/Output Summary (Last 24 hours) at 08/02/2020 1438 Last data filed at 08/02/2020 1024 Gross per 24 hour  Intake 974.49 ml  Output 1750 ml  Net -775.51 ml    LBM: Last BM Date: 08/01/20 Baseline Weight: Weight: 99.8 kg Most recent weight: Weight: 98.1 kg     Palliative Assessment/Data: PPS: 10%     Thank you for this consult. Palliative medicine will continue to follow and assist as needed.   Time In: 1438 Time Out: 1703 Time Total: 122 minutes Greater than 50%  of this time  was spent counseling and coordinating care related to the above assessment and plan.  Signed by: [id number], AGNP-C Palliative Medicine    Please contact Palliative Medicine Team phone at [phone] for questions and concerns.  For individual provider: See [phone]

## 2020-08-02 NOTE — Progress Notes (Signed)
PROGRESS NOTE        PATIENT DETAILS Name: Kristy Jensen Age: 70 y.o. Sex: female Date of Birth: Jun 28, 1950 Admit Date: 07/30/2020 Admitting Physician Steffanie Dunn, DO [id number], No Pcp Per (Inactive)  Brief Narrative: Patient is a 70 y.o. female with history of CVA, PAF, HLD, HTN presenting to the hospital with altered mental status, A. fib with RVR, hyperkalemia and septic shock.  Significant events: 3/30>> admit to ICU 4/2>> transferred to [facility]  Significant studies: 2/13>> Echo: EF 55-60%, grade 1 diastolic dysfunction. 3/30>> CT head: No acute intracranial abnormality. 3/30>> CT renal stone study: Bibasilar pulmonary infiltrates, no acute [place] abdominal pathology. 3/30>> chest x-ray: Stable bibasilar pulmonary fibrotic changes. 3/30>> MRI brain: Increased size of acute to subacute infarcts of the right basal ganglia.  Antimicrobial therapy: Aztreonam: 3/29>> Flagyl: 3/29>> 3/30 Vancomycin: 3/29 x 1  Microbiology data: 3/30>> urine culture:<10,000 colonies/mL 3/30>> 1/2 blood culture: Gram-positive cocci in clusters-likely contaminant  Procedures : None  Consults: CCM, neurology  DVT Prophylaxis : SCDs Start: 07/30/20 0837  IV heparin infusion  Subjective: Lying comfortably in bed no major issues overnight.  Minimally confused.  Assessment/Plan: Septic shock due to complicated UTI: Sepsis physiology has improved-culture data as above-although urine cultures essentially negative-being treated as complicated UTI.  We will plan on 7 days of aztreonam given severity of presentation.  Acute metabolic encephalopathy: Felt to be due to AKI/sepsis-minimally confused today-answering simple questions.  Continue supportive care.  PAF with RVR: Remains in sinus rhythm-on IV heparin-cardiology following.  AKI: Likely hemodynamically mediated-resolved.  Hypokalemia/hypomagnesemia/hypophosphatemia: Being repleted-recheck in  a.m.  Hypernatremia: Resolved-stop D5W.  Recheck electrolytes in a.m.  Normocytic anemia: Due to acute illness-no evidence of blood loss-monitor  Known right thyroid goiter with 1.7 cm nodule: TSH within normal limit-we will require further work-up including an FNA in the outpatient setting.  Dysphagia: Probably chronic-worsened by encephalopathy/acute illness-NG tube in place-await further recommendations from SLP.  Goals of care: Frail-recurrent hospitalizations-severe dysphagia-debilitated-full code for now-await further recommendations from palliative care.  Nutrition Problem: Nutrition Problem: Moderate Malnutrition Etiology: chronic illness Signs/Symptoms: mild fat depletion,moderate muscle depletion,percent weight loss (15.9% weight loss since 06/17/20) Percent weight loss: 15.9 % Interventions: Tube feeding  Obesity: Estimated body mass index is 31.94 kg/m as calculated from the following:   Height as of this encounter: 5\' 9"  (1.753 m).   Weight as of this encounter: 98.1 kg.     Diet: Diet Order            Diet NPO time specified  Diet effective now                  Code Status: Full code   Family Communication: Son-[name] Valladolid-[phone]-left a voicemail on 4/2.  Disposition Plan: Status is: Inpatient  Remains inpatient appropriate because:IV treatments appropriate due to intensity of illness or inability to take PO and Inpatient level of care appropriate due to severity of illness   Dispo: The patient is from: Home              Anticipated d/c is to: SNF              Patient currently is not medically stable to d/c.   Difficult to place patient No    Barriers to Discharge: Dysphagia-NG tube feeding in place-remains n.p.o.  Antimicrobial agents: Anti-infectives (From admission, onward)   Start  Dose/Rate Route Frequency Ordered Stop   08/02/20 1800  aztreonam (AZACTAM) 1 g in sodium chloride 0.9 % 100 mL IVPB  Status:  Discontinued        1  g 200 mL/hr over 30 Minutes Intravenous Every 8 hours 08/02/20 0941 08/02/20 0947   08/02/20 1045  aztreonam (AZACTAM) 1 g in sodium chloride 0.9 % 100 mL IVPB        1 g 200 mL/hr over 30 Minutes Intravenous Every 8 hours 08/02/20 0947 08/04/20 0159   07/31/20 0800  vancomycin (VANCOREADY) IVPB 750 mg/150 mL  Status:  Discontinued        750 mg 150 mL/hr over 60 Minutes Intravenous Every 24 hours 07/31/20 0718 07/31/20 0816   07/30/20 1100  aztreonam (AZACTAM) 1 g in sodium chloride 0.9 % 100 mL IVPB  Status:  Discontinued        1 g 200 mL/hr over 30 Minutes Intravenous Every 8 hours 07/30/20 0329 08/02/20 0941   07/30/20 1000  metroNIDAZOLE (FLAGYL) IVPB 500 mg  Status:  Discontinued        500 mg 100 mL/hr over 60 Minutes Intravenous Every 8 hours 07/30/20 0408 07/31/20 0816   07/30/20 0415  vancomycin (VANCOREADY) IVPB 1000 mg/200 mL  Status:  Discontinued        1,000 mg 200 mL/hr over 60 Minutes Intravenous  Once 07/30/20 0408 07/30/20 0410   07/30/20 0329  vancomycin variable dose per unstable renal function (pharmacist dosing)  Status:  Discontinued         Does not apply See admin instructions 07/30/20 0329 07/31/20 0757   07/30/20 0115  vancomycin (VANCOREADY) IVPB 2000 mg/400 mL        2,000 mg 200 mL/hr over 120 Minutes Intravenous  Once 07/30/20 0103 07/30/20 0534   07/30/20 0100  aztreonam (AZACTAM) 2 g in sodium chloride 0.9 % 100 mL IVPB        2 g 200 mL/hr over 30 Minutes Intravenous  Once 07/30/20 0059 07/30/20 0214   07/30/20 0100  metroNIDAZOLE (FLAGYL) IVPB 500 mg        500 mg 100 mL/hr over 60 Minutes Intravenous  Once 07/30/20 0059 07/30/20 0341   07/30/20 0100  vancomycin (VANCOREADY) IVPB 1000 mg/200 mL  Status:  Discontinued        1,000 mg 200 mL/hr over 60 Minutes Intravenous  Once 07/30/20 0059 07/30/20 0103       Time spent: 25- minutes-Greater than 50% of this time was spent in counseling, explanation of diagnosis, planning of further management,  and coordination of care.  MEDICATIONS: Scheduled Meds: . chlorhexidine  15 mL Mouth Rinse BID  . Chlorhexidine Gluconate Cloth  6 each Topical Daily  . insulin aspart  0-6 Units Subcutaneous Q4H  . mouth rinse  15 mL Mouth Rinse q12n4p   Continuous Infusions: . sodium chloride 10 mL/hr at 08/02/20 0308  . sodium chloride    . aztreonam 1 g (08/02/20 1028)  . dextrose 50 mL/hr at 08/02/20 0303  . feeding supplement (OSMOLITE 1.2 CAL) 65 mL/hr at 08/02/20 1014  . heparin 1,150 Units/hr (08/02/20 0929)  . potassium PHOSPHATE IVPB (in mmol) 20 mmol (08/02/20 0457)   PRN Meds:.sodium chloride, acetaminophen **OR** acetaminophen, docusate sodium, polyethylene glycol   PHYSICAL EXAM: Vital signs: Vitals:   08/01/20 1900 08/01/20 2156 08/02/20 0500 08/02/20 0809  BP: (!) 101/51 126/60  (!) 125/56  Pulse: 70 68  86  Resp: Temp: 98.9  F (37.2 C) 98.5 F (36.9 C)  98.9 F (37.2 C)  TempSrc: Oral Oral  Axillary  SpO2: 99% 99%  98%  Weight:   98.1 kg   Height:       Filed Weights   07/30/20 0056 07/31/20 0500 08/02/20 0500  Weight: 99.8 kg 96 kg 98.1 kg   Body mass index is 31.94 kg/m.   Gen Exam:Alert awake-not in any distress-mildly confused but answering simple questions appropriately HEENT:atraumatic, normocephalic Chest: B/L clear to auscultation anteriorly CVS:S1S2 regular Abdomen:soft non tender, non distended Extremities:no edema Neurology: Non focal-appears to have generalized weakness Skin: no rash  I have personally reviewed following labs and imaging studies  LABORATORY DATA: CBC: Recent Labs  Lab 07/30/20 0059 07/30/20 0517 07/30/20 1124 07/31/20 0354 08/01/20 0501 08/02/20 0048  WBC 19.8* 14.4*  --  9.6 6.1 5.3  NEUTROABS 17.1* 11.9*  --   --   --   --   HGB 15.1* 13.3 11.2* 11.4* 10.8* 10.2*  HCT 48.2* 45.7 33.0* 36.2 36.0 32.6*  MCV 90.9 97.0  --  90.0 92.5 90.1  PLT 247 185  --  159 151 145*    Basic Metabolic Panel: Recent  Labs  Lab 07/30/20 2151 07/31/20 0354 07/31/20 1509 07/31/20 1945 08/01/20 0501 08/02/20 0048  NA 150* 152* 152*  --  148* 142  K 3.3* 3.5 3.8  --  3.6 3.3*  CL 116* 118* 118*  --  115* 113*  CO2 26 26 23   --  22 22  GLUCOSE 169* 153* 154*  --  124* 161*  BUN 96* 85* 71*  --  51* 30*  CREATININE 1.88* 1.64* 1.49*  --  1.11* 0.93  CALCIUM 7.8* 8.1* 8.2*  --  8.0* 7.7*  MG 1.6* 2.0  --  1.9 1.8 1.5*  PHOS  --  2.2*  --  2.9 1.9* 2.2*    GFR: Estimated Creatinine Clearance: 71.2 mL/min (by C-G formula based on SCr of 0.93 mg/dL).  Liver Function Tests: Recent Labs  Lab 07/30/20 0059 07/30/20 0517  AST 15 17  ALT 10 6  ALKPHOS 71 65  BILITOT 0.9 0.8  PROT 6.7 5.8*  ALBUMIN 3.3* 2.7*   Recent Labs  Lab 07/30/20 0837 07/31/20 0924  LIPASE 145* 69*  AMYLASE 280*  --    No results for input(s): AMMONIA in the last 168 hours.  Coagulation Profile: Recent Labs  Lab 07/30/20 0059  INR 1.4*    Cardiac Enzymes: No results for input(s): CKTOTAL, CKMB, CKMBINDEX, TROPONINI in the last 168 hours.  BNP (last 3 results) No results for input(s): PROBNP in the last 8760 hours.  Lipid Profile: No results for input(s): CHOL, HDL, LDLCALC, TRIG, CHOLHDL, LDLDIRECT in the last 72 hours.  Thyroid Function Tests: No results for input(s): TSH, T4TOTAL, FREET4, T3FREE, THYROIDAB in the last 72 hours.  Anemia Panel: No results for input(s): VITAMINB12, FOLATE, FERRITIN, TIBC, IRON, RETICCTPCT in the last 72 hours.  Urine analysis:    Component Value Date/Time   COLORURINE AMBER (A) 07/30/2020 0214   APPEARANCEUR TURBID (A) 07/30/2020 0214   LABSPEC 1.017 07/30/2020 0214   PHURINE 5.0 07/30/2020 0214   GLUCOSEU 50 (A) 07/30/2020 0214   HGBUR MODERATE (A) 07/30/2020 0214   BILIRUBINUR NEGATIVE 07/30/2020 0214   KETONESUR 5 (A) 07/30/2020 0214   PROTEINUR 100 (A) 07/30/2020 0214   NITRITE NEGATIVE 07/30/2020 0214   LEUKOCYTESUR MODERATE (A) 07/30/2020 0214    Sepsis  Labs: Lactic Acid, Venous  Component Value Date/Time   LATICACIDVEN 2.1 (HH) 07/31/2020 0924    MICROBIOLOGY: Recent Results (from the past 240 hour(s))  Urine culture     Status: Abnormal   Collection Time: 07/30/20 12:59 AM   Specimen: In/Out Cath Urine  Result Value Ref Range Status   Specimen Description IN/OUT CATH URINE  Final   Special Requests NONE  Final   Culture (A)  Final    <10,000 COLONIES/mL INSIGNIFICANT GROWTH Performed at [facility], [address] [place], [place] [address]    Report Status 07/31/2020 FINAL  Final  Blood Culture (routine x 2)     Status: None (Preliminary result)   Collection Time: 07/30/20  1:27 AM   Specimen: BLOOD LEFT WRIST  Result Value Ref Range Status   Specimen Description BLOOD LEFT WRIST  Final   Special Requests   Final    BOTTLES DRAWN AEROBIC AND ANAEROBIC Blood Culture adequate volume   Culture  Setup Time   Final    GRAM POSITIVE COCCI IN CLUSTERS AEROBIC BOTTLE ONLY CRITICAL RESULT CALLED TO, READ BACK BY AND VERIFIED WITH: A [facility] PHARMD 1417 08/01/20 A BROWNING Performed at [facility], [address] [place], [place] [address]    Culture GRAM POSITIVE COCCI  Final   Report Status PENDING  Incomplete  Blood Culture ID Panel (Reflexed)     Status: Abnormal   Collection Time: 07/30/20  1:27 AM  Result Value Ref Range Status   Enterococcus faecalis NOT DETECTED NOT DETECTED Final   Enterococcus Faecium NOT DETECTED NOT DETECTED Final   Listeria monocytogenes NOT DETECTED NOT DETECTED Final   Staphylococcus species DETECTED (A) NOT DETECTED Final    Comment: CRITICAL RESULT CALLED TO, READ BACK BY AND VERIFIED WITH: A WOLFE PHARMD 1417 08/01/20 A BROWNING    Staphylococcus aureus (BCID) NOT DETECTED NOT DETECTED Final   Staphylococcus epidermidis NOT DETECTED NOT DETECTED Final   Staphylococcus lugdunensis NOT DETECTED NOT DETECTED Final   Streptococcus species NOT DETECTED NOT DETECTED Final    Streptococcus agalactiae NOT DETECTED NOT DETECTED Final   Streptococcus pneumoniae NOT DETECTED NOT DETECTED Final   Streptococcus pyogenes NOT DETECTED NOT DETECTED Final   A.calcoaceticus-baumannii NOT DETECTED NOT DETECTED Final   Bacteroides fragilis NOT DETECTED NOT DETECTED Final   Enterobacterales NOT DETECTED NOT DETECTED Final   Enterobacter cloacae complex NOT DETECTED NOT DETECTED Final   Escherichia coli NOT DETECTED NOT DETECTED Final   Klebsiella aerogenes NOT DETECTED NOT DETECTED Final   Klebsiella oxytoca NOT DETECTED NOT DETECTED Final   Klebsiella pneumoniae NOT DETECTED NOT DETECTED Final   Proteus species NOT DETECTED NOT DETECTED Final   Salmonella species NOT DETECTED NOT DETECTED Final   Serratia marcescens NOT DETECTED NOT DETECTED Final   Haemophilus influenzae NOT DETECTED NOT DETECTED Final   Neisseria meningitidis NOT DETECTED NOT DETECTED Final   Pseudomonas aeruginosa NOT DETECTED NOT DETECTED Final   Stenotrophomonas maltophilia NOT DETECTED NOT DETECTED Final   Candida albicans NOT DETECTED NOT DETECTED Final   Candida auris NOT DETECTED NOT DETECTED Final   Candida glabrata NOT DETECTED NOT DETECTED Final   Candida krusei NOT DETECTED NOT DETECTED Final   Candida parapsilosis NOT DETECTED NOT DETECTED Final   Candida tropicalis NOT DETECTED NOT DETECTED Final   Cryptococcus neoformans/gattii NOT DETECTED NOT DETECTED Final    Comment: Performed at [facility], [address] [place], [place] [address]  Resp Panel by RT-PCR (Flu A&B, Covid) Nasopharyngeal  Swab     Status: None   Collection Time: 07/30/20  1:45 AM   Specimen: Nasopharyngeal Swab; Nasopharyngeal(NP) swabs in vial transport medium  Result Value Ref Range Status   SARS Coronavirus 2 by RT PCR NEGATIVE NEGATIVE Final    Comment: (NOTE) SARS-CoV-2 target nucleic acids are NOT DETECTED.  The SARS-CoV-2 RNA is generally detectable in upper respiratory specimens during the acute  phase of infection. The lowest concentration of SARS-CoV-2 viral copies this assay can detect is 138 copies/mL. A negative result does not preclude SARS-Cov-2 infection and should not be used as the sole basis for treatment or other patient management decisions. A negative result may occur with  improper specimen collection/handling, submission of specimen other than nasopharyngeal swab, presence of viral mutation(s) within the areas targeted by this assay, and inadequate number of viral copies(<138 copies/mL). A negative result must be combined with clinical observations, patient history, and epidemiological information. The expected result is Negative.  Fact Sheet for Patients:  BloggerCourse.com  Fact Sheet for Healthcare Providers:  SeriousBroker.it  This test is no t yet approved or cleared by the [place] FDA and  has been authorized for detection and/or diagnosis of SARS-CoV-2 by FDA under an Emergency Use Authorization (EUA). This EUA will remain  in effect (meaning this test can be used) for the duration of the COVID-19 declaration under Section 564(b)(1) of the Act, 21 U.S.C.section 360bbb-3(b)(1), unless the authorization is terminated  or revoked sooner.       Influenza A by PCR NEGATIVE NEGATIVE Final   Influenza B by PCR NEGATIVE NEGATIVE Final    Comment: (NOTE) The Xpert Xpress SARS-CoV-2/FLU/RSV plus assay is intended as an aid in the diagnosis of influenza from Nasopharyngeal swab specimens and should not be used as a sole basis for treatment. Nasal washings and aspirates are unacceptable for Xpert Xpress SARS-CoV-2/FLU/RSV testing.  Fact Sheet for Patients: BloggerCourse.com  Fact Sheet for Healthcare Providers: SeriousBroker.it  This test is not yet approved or cleared by the [place] FDA and has been authorized for detection and/or diagnosis of  SARS-CoV-2 by FDA under an Emergency Use Authorization (EUA). This EUA will remain in effect (meaning this test can be used) for the duration of the COVID-19 declaration under Section 564(b)(1) of the Act, 21 U.S.C. section 360bbb-3(b)(1), unless the authorization is terminated or revoked.  Performed at [facility], [address], [place], [place] [address]   Blood Culture (routine x 2)     Status: None (Preliminary result)   Collection Time: 07/30/20  1:53 AM   Specimen: BLOOD  Result Value Ref Range Status   Specimen Description BLOOD SITE NOT SPECIFIED  Final   Special Requests   Final    BOTTLES DRAWN AEROBIC AND ANAEROBIC Blood Culture adequate volume   Culture   Final    NO GROWTH 2 DAYS Performed at [facility], [address], [place], [place] [address]    Report Status PENDING  Incomplete  MRSA PCR Screening     Status: None   Collection Time: 07/30/20  3:31 PM   Specimen: Nasopharyngeal  Result Value Ref Range Status   MRSA by PCR NEGATIVE NEGATIVE Final    Comment:        The GeneXpert MRSA Assay (FDA approved for NASAL specimens only), is one component of a comprehensive MRSA colonization surveillance program. It is not intended to diagnose MRSA infection nor to guide or monitor treatment for MRSA infections. Performed at [facility], [address] [place], [place] [address]     RADIOLOGY STUDIES/RESULTS: No results found.   LOS: 3 days   Jeoffrey Massed, MD  [facility]    To contact the attending provider between 7A-7P or the covering provider during after hours 7P-7A, please log into the web site [email or web address] and access using universal [place] password for that web site. If you do not have the password, please call the hospital operator.  08/02/2020, 10:45 AM

## 2020-08-02 NOTE — Significant Event (Incomplete)
Rapid Response Event Note   Reason for Call :  SVT-200s  Pt was given 6mg  adenosine by , RN PTA RRT. Pt HR was NSR-98 on my arrival.  Initial Focused Assessment:  Pt lying in bed, alert and oriented, in no distress. Pt denies chest pain/SOB. Lungs clear, diminished in the bases. Skin warm and dry.  HR-98, SBP-140s, RR-16, SpO2-99 on RA  Interventions:  Done PTA RRT: EKG 6mg  adenosine given IV  5mg  metoprolol IV x 1 2g Mg IVPB x 1 BMP/Mg/CK/CKMB/Trop/Albumin  Plan of Care:     Event Summary:   MD Notified: Dr. 04-02-1993 at bedside on my arrival Call Time:2331 Arrival End Time:  , RN

## 2020-08-02 NOTE — Progress Notes (Signed)
MD at bedside. Adenosine 6mg  ordered and given Iv by Mindy/ 10 ml Flush.

## 2020-08-02 NOTE — Evaluation (Signed)
Physical Therapy Evaluation Patient Details Name: Kristy Jensen MRN: [id number] DOB: [birth year] Today's Date: 08/02/2020   History of Present Illness  70 y.o. female with history of CVA, PAF, HLD, HTN presenting to the hospital 07/30/20 with altered mental status, A. fib with RVR, hyperkalemia and septic shock. 3/30  MRI brain: Increased size of acute to subacute infarcts of the right basal ganglia.  Clinical Impression   Pt admitted secondary to problem above with deficits below. PTA patient was at [facility] since Feb 2022 admission with unknown mobility status (was ambulatory per chart prior to feb 2022).  Pt currently requires +2 total assist for all mobility. Intermittently follows commands or verbally responds to questions.  Will continue to follow acutely to maximize functional mobility, independence, and safety.       Follow Up Recommendations SNF;Supervision/Assistance - 24 hour    Equipment Recommendations  Wheelchair (measurements PT);Wheelchair cushion (measurements PT);Hospital bed (lift; medical transport)    Recommendations for Other Services       Precautions / Restrictions Precautions Precautions: Fall Precaution Comments: cortrak, flexseal, purewick Required Braces or Orthoses: Other Brace (bil mittens)      Mobility  Bed Mobility Overal bed mobility: Needs Assistance Bed Mobility: Rolling;Supine to Sit;Sit to Supine Rolling: +2 for physical assistance;Total assist   Supine to sit: +2 for physical assistance;Total assist Sit to supine: +2 for physical assistance;Total assist   General bed mobility comments: pt exiting the bed on R side with HOB elevated to help with pivot on pad helicopter sitting. total (A) for bil LE. pt static sitting minguard to total (A) when pushing posteriorly. pt with flexed posture and posterior pelvic tilt. Pt total +2 total to return to supine with total (A) for bil LE. Pt dependent on staff for all mobility. pt reports comfort L side lying  with pillow between knees hob elevated 30 degrees due to feeding tube    Transfers                 General transfer comment: NT due to poor static sitting with total +2 (A). Recommendation hoyer lift only at this time. Unknown prior to admission transfers  Ambulation/Gait                Stairs            Wheelchair Mobility    Modified Rankin (Stroke Patients Only)       Balance Overall balance assessment: Needs assistance Sitting-balance support: No upper extremity supported;Feet supported Sitting balance-Leahy Scale: Poor Sitting balance - Comments: posterior bias and does not try to correct; no weightbearing through UEs; when attempting to move torso anteriorly over BOS, pt begins to push backwards. When not actively pushing she is min assist to maintain balance with posterior lean                                     Pertinent Vitals/Pain Pain Assessment: Faces Faces Pain Scale: Hurts a little bit Pain Location: response to painful stimuli in R foot to generate activation Pain Intervention(s): Monitored during session    Home Living Family/patient expects to be discharged to:: Skilled nursing facility                 Additional Comments: pt with cognitive limitations and is poor historian, no family avaialbe to provide prior level and home information    Prior Function Level of Independence: Needs assistance  Comments: unknown previous admission 06/2020 noted to be poor historian by staff. reports 06/2020 walking prior to that admission and fall from bed pt was living indep with recent move from [place] due to cognitive decline and oriented x2 at that time baseline per chart. Pt d/c [facility] now readmitted 3/30     Hand Dominance   Dominant Hand: Right    Extremity/Trunk Assessment   Upper Extremity Assessment Upper Extremity Assessment: Defer to OT evaluation RUE Deficits / Details: reaching for face with R UE  and able to sustain shoulder elevation 80 degrees, RUE Coordination: decreased gross motor;decreased fine motor LUE Deficits / Details: pt keeping arm adducted to body and elbow flexed but is able to extend elbow with AAROM and shoulder flexion to 70 degrees. pt reaching for face with L UE at times with neck flexion to meet hand. pt asked to "pull" bicep and instead pt pushing therapist away Triceps LUE Coordination: decreased fine motor;decreased gross motor    Lower Extremity Assessment Lower Extremity Assessment: Generalized weakness RLE Deficits / Details: weakly moved LLE on command when seated EOB; not following commands to move R LE. pt with nail bed pressure and states "ouch" and moves Foot back    Cervical / Trunk Assessment Cervical / Trunk Assessment: Kyphotic;Other exceptions (R neck rotation preference, provided PROM L neck rotation and tolerates)  Communication   Communication: Expressive difficulties  Cognition Arousal/Alertness: Awake/alert Behavior During Therapy: Flat affect Overall Cognitive Status: Impaired/Different from baseline Area of Impairment: Attention;Memory;Following commands;Safety/judgement;Awareness                   Current Attention Level: Focused Memory: Decreased short-term memory Following Commands: Follows one step commands inconsistently;Follows one step commands with increased time Safety/Judgement: Decreased awareness of safety;Decreased awareness of deficits Awareness: Intellectual   General Comments: Pt able to report last name. pt at times does not response to questioned ask. pt other times responds with delayed response. pt inconsistent in response. pt followed commands for squeeze, release grasp, thumbs up and down supine. pt reports closing R eye because its tired. pt minimal responses      General Comments General comments (skin integrity, edema, etc.): noted to have wound on buttock / sacrum area stage 2 or greater. RN present to  observe wound. Pt with barrier cream and hygiene performed in side lying. Pt with pressure relief by L side lying. Recommendation for airmattress overlay for decreased risk for skin breakdown due to decr mobility    Exercises     Assessment/Plan    PT Assessment Patient needs continued PT services  PT Problem List Decreased strength;Decreased activity tolerance;Decreased balance;Decreased mobility;Decreased cognition;Decreased knowledge of use of DME;Decreased safety awareness;Decreased knowledge of precautions;Obesity;Decreased skin integrity       PT Treatment Interventions DME instruction;Gait training;Functional mobility training;Therapeutic activities;Therapeutic exercise;Balance training;Neuromuscular re-education;Cognitive remediation;Patient/family education    PT Goals (Current goals can be found in the Care Plan section)  Acute Rehab PT Goals Patient Stated Goal: none stated PT Goal Formulation: Patient unable to participate in goal setting Time For Goal Achievement: 08/16/20 Potential to Achieve Goals: Fair    Frequency Min 2X/week   Barriers to discharge        Co-evaluation PT/OT/SLP Co-Evaluation/Treatment: Yes Reason for Co-Treatment: Complexity of the patient's impairments (multi-system involvement);For patient/therapist safety PT goals addressed during session: Balance;Mobility/safety with mobility OT goals addressed during session: ADL's and self-care;Proper use of Adaptive equipment and DME;Strengthening/ROM       AM-PAC PT "6 Clicks" Mobility  Outcome Measure Help needed turning from your back to your side while in a flat bed without using bedrails?: Total Help needed moving from lying on your back to sitting on the side of a flat bed without using bedrails?: Total Help needed moving to and from a bed to a chair (including a wheelchair)?: Total Help needed standing up from a chair using your arms (e.g., wheelchair or bedside chair)?: Total Help needed to  walk in hospital room?: Total Help needed climbing 3-5 steps with a railing? : Total 6 Click Score: 6    End of Session   Activity Tolerance: Patient limited by lethargy Patient left: in bed;with call bell/phone within reach;with bed alarm set Nurse Communication: Mobility status;Other (comment) (called in to see pressure sore on sacrum) PT Visit Diagnosis: Muscle weakness (generalized) (M62.81);Other symptoms and signs involving the nervous system (M19.622)    Time: 2979-8921 PT Time Calculation (min) (ACUTE ONLY): 28 min   Charges:   PT Evaluation $PT Eval Moderate Complexity: 1 Mod           Jerolyn Center, PT Pager [phone]   Zena Amos 08/02/2020, 4:28 PM

## 2020-08-02 NOTE — Progress Notes (Signed)
PT Cancellation Note  Patient Details Name: Kristy Jensen MRN: [id number] DOB: 06-07-1950   Cancelled Treatment:    Reason Eval/Treat Not Completed: Fatigue/lethargy limiting ability to participate   Patient easily falling back asleep with minimal arousal despite stimulation.   Will reassess later today vs 4/3 as schedule permits   Jerolyn Center, PT Pager [phone]    Zena Amos 08/02/2020, 2:03 PM

## 2020-08-03 DIAGNOSIS — N170 Acute kidney failure with tubular necrosis: Secondary | ICD-10-CM | POA: Diagnosis not present

## 2020-08-03 DIAGNOSIS — I471 Supraventricular tachycardia: Secondary | ICD-10-CM

## 2020-08-03 DIAGNOSIS — A021 Salmonella sepsis: Secondary | ICD-10-CM | POA: Diagnosis not present

## 2020-08-03 DIAGNOSIS — N390 Urinary tract infection, site not specified: Secondary | ICD-10-CM | POA: Diagnosis not present

## 2020-08-03 DIAGNOSIS — N179 Acute kidney failure, unspecified: Secondary | ICD-10-CM | POA: Diagnosis not present

## 2020-08-03 LAB — CBC
HCT: 34.9 % — ABNORMAL LOW (ref 36.0–46.0)
Hemoglobin: 11.1 g/dL — ABNORMAL LOW (ref 12.0–15.0)
MCH: 28 pg (ref 26.0–34.0)
MCHC: 31.8 g/dL (ref 30.0–36.0)
MCV: 87.9 fL (ref 80.0–100.0)
Platelets: 71 10*3/uL — ABNORMAL LOW (ref 150–400)
RBC: 3.97 MIL/uL (ref 3.87–5.11)
RDW: 15.3 % (ref 11.5–15.5)
WBC: 5.6 10*3/uL (ref 4.0–10.5)
nRBC: 0 % (ref 0.0–0.2)

## 2020-08-03 LAB — BASIC METABOLIC PANEL
Anion gap: 5 (ref 5–15)
Anion gap: 7 (ref 5–15)
BUN: 21 mg/dL (ref 8–23)
BUN: 19 mg/dL (ref 8–23)
CO2: 23 mmol/L (ref 22–32)
CO2: 22 mmol/L (ref 22–32)
Calcium: 8.1 mg/dL — ABNORMAL LOW (ref 8.9–10.3)
Calcium: 8.2 mg/dL — ABNORMAL LOW (ref 8.9–10.3)
Chloride: 115 mmol/L — ABNORMAL HIGH (ref 98–111)
Chloride: 114 mmol/L — ABNORMAL HIGH (ref 98–111)
Creatinine, Ser: 0.79 mg/dL (ref 0.44–1.00)
Creatinine, Ser: 0.84 mg/dL (ref 0.44–1.00)
GFR, Estimated: >60 mL/min (ref >60)
GFR, Estimated: >60 mL/min (ref >60)
Glucose, Bld: 151 mg/dL — ABNORMAL HIGH (ref 70–99)
Glucose, Bld: 146 mg/dL — ABNORMAL HIGH (ref 70–99)
Potassium: 4.1 mmol/L (ref 3.5–5.1)
Potassium: 4.1 mmol/L (ref 3.5–5.1)
Sodium: 143 mmol/L (ref 135–145)
Sodium: 143 mmol/L (ref 135–145)

## 2020-08-03 LAB — GLUCOSE, CAPILLARY
Glucose-Capillary: 138 mg/dL — ABNORMAL HIGH (ref 70–99)
Glucose-Capillary: 149 mg/dL — ABNORMAL HIGH (ref 70–99)
Glucose-Capillary: 153 mg/dL — ABNORMAL HIGH (ref 70–99)
Glucose-Capillary: 154 mg/dL — ABNORMAL HIGH (ref 70–99)
Glucose-Capillary: 155 mg/dL — ABNORMAL HIGH (ref 70–99)
Glucose-Capillary: 172 mg/dL — ABNORMAL HIGH (ref 70–99)

## 2020-08-03 LAB — TROPONIN I (HIGH SENSITIVITY)
Troponin I (High Sensitivity): 40 ng/L — ABNORMAL HIGH (ref <18)
Troponin I (High Sensitivity): 38 ng/L — ABNORMAL HIGH (ref <18)

## 2020-08-03 LAB — CULTURE, BLOOD (ROUTINE X 2): Special Requests: ADEQUATE

## 2020-08-03 LAB — PHOSPHORUS: Phosphorus: 1.9 mg/dL — ABNORMAL LOW (ref 2.5–4.6)

## 2020-08-03 LAB — HEPARIN LEVEL (UNFRACTIONATED): Heparin Unfractionated: 0.35 IU/mL (ref 0.30–0.70)

## 2020-08-03 LAB — MAGNESIUM
Magnesium: 1.6 mg/dL — ABNORMAL LOW (ref 1.7–2.4)
Magnesium: 1.6 mg/dL — ABNORMAL LOW (ref 1.7–2.4)

## 2020-08-03 LAB — ALBUMIN: Albumin: 2.2 g/dL — ABNORMAL LOW (ref 3.5–5.0)

## 2020-08-03 MED ORDER — MAGNESIUM SULFATE 4 GM/100ML IV SOLN
4.0000 g | Freq: Once | INTRAVENOUS | Status: AC
  Administered 2020-08-03: 4 g via INTRAVENOUS
  Filled 2020-08-03: qty 100

## 2020-08-03 MED ORDER — METOPROLOL TARTRATE 5 MG/5ML IV SOLN
5.0000 mg | Freq: Three times a day (TID) | INTRAVENOUS | Status: DC
  Administered 2020-08-03 – 2020-08-06 (×11): 5 mg via INTRAVENOUS
  Filled 2020-08-03 (×11): qty 5

## 2020-08-03 MED FILL — Medication: Qty: 1 | Status: AC

## 2020-08-03 NOTE — Plan of Care (Signed)

## 2020-08-03 NOTE — Progress Notes (Signed)
PROGRESS NOTE        PATIENT DETAILS Name: Kristy Jensen Age: 70 y.o. Sex: female Date of Birth: 01-31-1951 Admit Date: 07/30/2020 Admitting Physician Steffanie Dunn, DO [id number], No Pcp Per (Inactive)  Brief Narrative: Patient is a 70 y.o. female with history of CVA, PAF, HLD, HTN presenting to the hospital with altered mental status, A. fib with RVR, hyperkalemia and septic shock.  Significant events: 3/30>> admit to ICU 4/2>> transferred to [facility]  Significant studies: 2/13>> Echo: EF 55-60%, grade 1 diastolic dysfunction. 3/30>> CT head: No acute intracranial abnormality. 3/30>> CT renal stone study: Bibasilar pulmonary infiltrates, no acute [place] abdominal pathology. 3/30>> chest x-ray: Stable bibasilar pulmonary fibrotic changes. 3/30>> MRI brain: Increased size of acute to subacute infarcts of the right basal ganglia.  Antimicrobial therapy: Aztreonam: 3/29>> Flagyl: 3/29>> 3/30 Vancomycin: 3/29 x 1  Microbiology data: 3/30>> urine culture:<10,000 colonies/mL 3/30>> 1/2 blood culture: Gram-positive cocci in clusters-likely contaminant  Procedures : None  Consults: CCM, neurology  DVT Prophylaxis : SCDs Start: 07/30/20 0837  IV heparin infusion  Subjective: Remains comfortable-very lethargic-but able to answer some questions appropriately.  Developed SVT last night.  Assessment/Plan: Septic shock due to complicated UTI: Sepsis physiology has improved-culture data as above-although urine cultures essentially negative-being treated as complicated UTI.  We will plan on 7 days of aztreonam given severity of presentation.  Acute metabolic encephalopathy: Felt to be due to AKI/sepsis-minimally confused today-answering simple questions.  Continue supportive care.  PAF with RVR-SVT: Developed SVT last night-given adenosine-currently in sinus rhythm-now on beta-blocker-remains on IV heparin.  Await further input from cardiology.    AKI:  Likely hemodynamically mediated-resolved.  Hypokalemia/hypomagnesemia/hypophosphatemia: Potassium stable-continue to replete magnesium.  Hypernatremia: Resolved.  On free water flushes through PEG tube.  Normocytic anemia: Due to acute illness-no evidence of blood loss-monitor  Known right thyroid goiter with 1.7 cm nodule: TSH within normal limit-we will require further work-up including an FNA in the outpatient setting.  Dysphagia: Probably chronic-worsened by encephalopathy/acute illness-NG tube in place-await further recommendations from SLP.  Goals of care: Frail-recurrent hospitalizations-severe dysphagia-debilitated-DNR-palliative care working with family-family meeting set up for midweek once patient's son arrives from [place].  Nutrition Problem: Nutrition Problem: Moderate Malnutrition Etiology: chronic illness Signs/Symptoms: mild fat depletion,moderate muscle depletion,percent weight loss (15.9% weight loss since 06/17/20) Percent weight loss: 15.9 % Interventions: Tube feeding  Obesity: Estimated body mass index is 31.94 kg/m as calculated from the following:   Height as of this encounter: 5\' 9"  (1.753 m).   Weight as of this encounter: 98.1 kg.     Diet: Diet Order            Diet NPO time specified  Diet effective now                  Code Status: Full code   Family Communication: Daughter-[name]-[phone] on 4/3 Son-[name] Segal-[phone]-left a voicemail on 4/2.  Disposition Plan: Status is: Inpatient  Remains inpatient appropriate because:IV treatments appropriate due to intensity of illness or inability to take PO and Inpatient level of care appropriate due to severity of illness   Dispo: The patient is from: Home              Anticipated d/c is to: SNF              Patient currently is not medically stable to  d/c.   Difficult to place patient No    Barriers to Discharge: Dysphagia-NG tube feeding in place-remains  n.p.o.  Antimicrobial agents: Anti-infectives (From admission, onward)   Start     Dose/Rate Route Frequency Ordered Stop   08/02/20 1800  aztreonam (AZACTAM) 1 g in sodium chloride 0.9 % 100 mL IVPB  Status:  Discontinued        1 g 200 mL/hr over 30 Minutes Intravenous Every 8 hours 08/02/20 0941 08/02/20 0947   08/02/20 1045  aztreonam (AZACTAM) 1 g in sodium chloride 0.9 % 100 mL IVPB        1 g 200 mL/hr over 30 Minutes Intravenous Every 8 hours 08/02/20 0947 08/04/20 0159   07/31/20 0800  vancomycin (VANCOREADY) IVPB 750 mg/150 mL  Status:  Discontinued        750 mg 150 mL/hr over 60 Minutes Intravenous Every 24 hours 07/31/20 0718 07/31/20 0816   07/30/20 1100  aztreonam (AZACTAM) 1 g in sodium chloride 0.9 % 100 mL IVPB  Status:  Discontinued        1 g 200 mL/hr over 30 Minutes Intravenous Every 8 hours 07/30/20 0329 08/02/20 0941   07/30/20 1000  metroNIDAZOLE (FLAGYL) IVPB 500 mg  Status:  Discontinued        500 mg 100 mL/hr over 60 Minutes Intravenous Every 8 hours 07/30/20 0408 07/31/20 0816   07/30/20 0415  vancomycin (VANCOREADY) IVPB 1000 mg/200 mL  Status:  Discontinued        1,000 mg 200 mL/hr over 60 Minutes Intravenous  Once 07/30/20 0408 07/30/20 0410   07/30/20 0329  vancomycin variable dose per unstable renal function (pharmacist dosing)  Status:  Discontinued         Does not apply See admin instructions 07/30/20 0329 07/31/20 0757   07/30/20 0115  vancomycin (VANCOREADY) IVPB 2000 mg/400 mL        2,000 mg 200 mL/hr over 120 Minutes Intravenous  Once 07/30/20 0103 07/30/20 0534   07/30/20 0100  aztreonam (AZACTAM) 2 g in sodium chloride 0.9 % 100 mL IVPB        2 g 200 mL/hr over 30 Minutes Intravenous  Once 07/30/20 0059 07/30/20 0214   07/30/20 0100  metroNIDAZOLE (FLAGYL) IVPB 500 mg        500 mg 100 mL/hr over 60 Minutes Intravenous  Once 07/30/20 0059 07/30/20 0341   07/30/20 0100  vancomycin (VANCOREADY) IVPB 1000 mg/200 mL  Status:  Discontinued         1,000 mg 200 mL/hr over 60 Minutes Intravenous  Once 07/30/20 0059 07/30/20 0103       Time spent: 25- minutes-Greater than 50% of this time was spent in counseling, explanation of diagnosis, planning of further management, and coordination of care.  MEDICATIONS: Scheduled Meds: . chlorhexidine  15 mL Mouth Rinse BID  . Chlorhexidine Gluconate Cloth  6 each Topical Daily  . free water  100 mL Per Tube Q8H  . insulin aspart  0-6 Units Subcutaneous Q4H  . mouth rinse  15 mL Mouth Rinse q12n4p  . metoprolol tartrate  5 mg Intravenous Q8H   Continuous Infusions: . sodium chloride 10 mL/hr at 08/02/20 0308  . sodium chloride    . aztreonam 1 g (08/03/20 0926)  . feeding supplement (OSMOLITE 1.2 CAL) 1,000 mL (08/02/20 1153)  . heparin 1,150 Units/hr (08/03/20 0713)   PRN Meds:.sodium chloride, acetaminophen **OR** acetaminophen, docusate sodium, polyethylene glycol   PHYSICAL EXAM: Vital signs: Vitals:  08/03/20 0300 08/03/20 0400 08/03/20 0500 08/03/20 0800  BP: 120/62 (!) 144/75 126/69 (!) 129/54  Pulse: 80 88 80 70  Resp: Temp:   98.7 F (37.1 C) 97.6 F (36.4 C)  TempSrc:   Oral Oral  SpO2: 97%  96% 98%  Weight:      Height:       Filed Weights   07/30/20 0056 07/31/20 0500 08/02/20 0500  Weight: 99.8 kg 96 kg 98.1 kg   Body mass index is 31.94 kg/m.   Gen Exam: Very frail-weak appearing-slightly confused but not in any distress. HEENT:atraumatic, normocephalic Chest: B/L clear to auscultation anteriorly CVS:S1S2 regular Abdomen:soft non tender, non distended Extremities:no edema Neurology: Non focal-generalized weakness Skin: no rash  I have personally reviewed following labs and imaging studies  LABORATORY DATA: CBC: Recent Labs  Lab 07/30/20 0059 07/30/20 0517 07/30/20 1124 07/31/20 0354 08/01/20 0501 08/02/20 0048 08/03/20 0011  WBC 19.8* 14.4*  --  9.6 6.1 5.3 5.6  NEUTROABS 17.1* 11.9*  --   --   --   --   --   HGB  15.1* 13.3 11.2* 11.4* 10.8* 10.2* 11.1*  HCT 48.2* 45.7 33.0* 36.2 36.0 32.6* 34.9*  MCV 90.9 97.0  --  90.0 92.5 90.1 87.9  PLT 247 185  --  159 151 145* 71*    Basic Metabolic Panel: Recent Labs  Lab 07/31/20 0354 07/31/20 1509 07/31/20 1945 08/01/20 0501 08/02/20 0048 08/03/20 0011  NA 152* 152*  --  148* 142 143  143  K 3.5 3.8  --  3.6 3.3* 4.1  4.1  CL 118* 118*  --  115* 113* 114*  115*  CO2 26 23  --  GLUCOSE 153* 154*  --  124* 161* 146*  151*  BUN 85* 71*  --  51* 30* 19  21  CREATININE 1.64* 1.49*  --  1.11* 0.93 0.84  0.79  CALCIUM 8.1* 8.2*  --  8.0* 7.7* 8.2*  8.1*  MG 2.0  --  1.9 1.8 1.5* 1.6*  1.6*  PHOS 2.2*  --  2.9 1.9* 2.2* 1.9*    GFR: Estimated Creatinine Clearance: 78.8 mL/min (by C-G formula based on SCr of 0.84 mg/dL).  Liver Function Tests: Recent Labs  Lab 07/30/20 0059 07/30/20 0517 08/03/20 0011  AST 15 17  --   ALT 10 6  --   ALKPHOS 71 65  --   BILITOT 0.9 0.8  --   PROT 6.7 5.8*  --   ALBUMIN 3.3* 2.7* 2.2*   Recent Labs  Lab 07/30/20 0837 07/31/20 0924  LIPASE 145* 69*  AMYLASE 280*  --    No results for input(s): AMMONIA in the last 168 hours.  Coagulation Profile: Recent Labs  Lab 07/30/20 0059  INR 1.4*    Cardiac Enzymes: No results for input(s): CKTOTAL, CKMB, CKMBINDEX, TROPONINI in the last 168 hours.  BNP (last 3 results) No results for input(s): PROBNP in the last 8760 hours.  Lipid Profile: No results for input(s): CHOL, HDL, LDLCALC, TRIG, CHOLHDL, LDLDIRECT in the last 72 hours.  Thyroid Function Tests: No results for input(s): TSH, T4TOTAL, FREET4, T3FREE, THYROIDAB in the last 72 hours.  Anemia Panel: No results for input(s): VITAMINB12, FOLATE, FERRITIN, TIBC, IRON, RETICCTPCT in the last 72 hours.  Urine analysis:    Component Value Date/Time   COLORURINE AMBER (A) 07/30/2020 0214   APPEARANCEUR TURBID (A) 07/30/2020 0214  LABSPEC 1.017 07/30/2020 0214   PHURINE  5.0 07/30/2020 0214   GLUCOSEU 50 (A) 07/30/2020 0214   HGBUR MODERATE (A) 07/30/2020 0214   BILIRUBINUR NEGATIVE 07/30/2020 0214   KETONESUR 5 (A) 07/30/2020 0214   PROTEINUR 100 (A) 07/30/2020 0214   NITRITE NEGATIVE 07/30/2020 0214   LEUKOCYTESUR MODERATE (A) 07/30/2020 0214    Sepsis Labs: Lactic Acid, Venous    Component Value Date/Time   LATICACIDVEN 2.1 (HH) 07/31/2020 0924    MICROBIOLOGY: Recent Results (from the past 240 hour(s))  Urine culture     Status: Abnormal   Collection Time: 07/30/20 12:59 AM   Specimen: In/Out Cath Urine  Result Value Ref Range Status   Specimen Description IN/OUT CATH URINE  Final   Special Requests NONE  Final   Culture (A)  Final    <10,000 COLONIES/mL INSIGNIFICANT GROWTH Performed at [facility], [address], [place], [place] [address]    Report Status 07/31/2020 FINAL  Final  Blood Culture (routine x 2)     Status: Abnormal   Collection Time: 07/30/20  1:27 AM   Specimen: BLOOD LEFT WRIST  Result Value Ref Range Status   Specimen Description BLOOD LEFT WRIST  Final   Special Requests   Final    BOTTLES DRAWN AEROBIC AND ANAEROBIC Blood Culture adequate volume   Culture  Setup Time   Final    GRAM POSITIVE COCCI IN CLUSTERS AEROBIC BOTTLE ONLY CRITICAL RESULT CALLED TO, READ BACK BY AND VERIFIED WITH: A WOLFE PHARMD 1417 08/01/20 A BROWNING    Culture (A)  Final    STAPHYLOCOCCUS CAPITIS THE SIGNIFICANCE OF ISOLATING THIS ORGANISM FROM A SINGLE SET OF BLOOD CULTURES WHEN MULTIPLE SETS ARE DRAWN IS UNCERTAIN. PLEASE NOTIFY THE MICROBIOLOGY DEPARTMENT WITHIN ONE WEEK IF SPECIATION AND SENSITIVITIES ARE REQUIRED. Performed at [facility], [address], [place], [place] [address]    Report Status 08/03/2020 FINAL  Final  Blood Culture ID Panel (Reflexed)     Status: Abnormal   Collection Time: 07/30/20  1:27 AM  Result Value Ref Range Status   Enterococcus faecalis NOT DETECTED NOT DETECTED Final   Enterococcus  Faecium NOT DETECTED NOT DETECTED Final   Listeria monocytogenes NOT DETECTED NOT DETECTED Final   Staphylococcus species DETECTED (A) NOT DETECTED Final    Comment: CRITICAL RESULT CALLED TO, READ BACK BY AND VERIFIED WITH: A WOLFE PHARMD 1417 08/01/20 A BROWNING    Staphylococcus aureus (BCID) NOT DETECTED NOT DETECTED Final   Staphylococcus epidermidis NOT DETECTED NOT DETECTED Final   Staphylococcus lugdunensis NOT DETECTED NOT DETECTED Final   Streptococcus species NOT DETECTED NOT DETECTED Final   Streptococcus agalactiae NOT DETECTED NOT DETECTED Final   Streptococcus pneumoniae NOT DETECTED NOT DETECTED Final   Streptococcus pyogenes NOT DETECTED NOT DETECTED Final   A.calcoaceticus-baumannii NOT DETECTED NOT DETECTED Final   Bacteroides fragilis NOT DETECTED NOT DETECTED Final   Enterobacterales NOT DETECTED NOT DETECTED Final   Enterobacter cloacae complex NOT DETECTED NOT DETECTED Final   Escherichia coli NOT DETECTED NOT DETECTED Final   Klebsiella aerogenes NOT DETECTED NOT DETECTED Final   Klebsiella oxytoca NOT DETECTED NOT DETECTED Final   Klebsiella pneumoniae NOT DETECTED NOT DETECTED Final   Proteus species NOT DETECTED NOT DETECTED Final   Salmonella species NOT DETECTED NOT DETECTED Final   Serratia marcescens NOT DETECTED NOT DETECTED Final   Haemophilus influenzae NOT DETECTED NOT DETECTED Final   Neisseria meningitidis NOT DETECTED NOT DETECTED Final  Pseudomonas aeruginosa NOT DETECTED NOT DETECTED Final   Stenotrophomonas maltophilia NOT DETECTED NOT DETECTED Final   Candida albicans NOT DETECTED NOT DETECTED Final   Candida auris NOT DETECTED NOT DETECTED Final   Candida glabrata NOT DETECTED NOT DETECTED Final   Candida krusei NOT DETECTED NOT DETECTED Final   Candida parapsilosis NOT DETECTED NOT DETECTED Final   Candida tropicalis NOT DETECTED NOT DETECTED Final   Cryptococcus neoformans/gattii NOT DETECTED NOT DETECTED Final    Comment: Performed at  [facility], [address] [place], [place] [address]  Resp Panel by RT-PCR (Flu A&B, Covid) Nasopharyngeal Swab     Status: None   Collection Time: 07/30/20  1:45 AM   Specimen: Nasopharyngeal Swab; Nasopharyngeal(NP) swabs in vial transport medium  Result Value Ref Range Status   SARS Coronavirus 2 by RT PCR NEGATIVE NEGATIVE Final    Comment: (NOTE) SARS-CoV-2 target nucleic acids are NOT DETECTED.  The SARS-CoV-2 RNA is generally detectable in upper respiratory specimens during the acute phase of infection. The lowest concentration of SARS-CoV-2 viral copies this assay can detect is 138 copies/mL. A negative result does not preclude SARS-Cov-2 infection and should not be used as the sole basis for treatment or other patient management decisions. A negative result may occur with  improper specimen collection/handling, submission of specimen other than nasopharyngeal swab, presence of viral mutation(s) within the areas targeted by this assay, and inadequate number of viral copies(<138 copies/mL). A negative result must be combined with clinical observations, patient history, and epidemiological information. The expected result is Negative.  Fact Sheet for Patients:  BloggerCourse.com  Fact Sheet for Healthcare Providers:  SeriousBroker.it  This test is no t yet approved or cleared by the [place] FDA and  has been authorized for detection and/or diagnosis of SARS-CoV-2 by FDA under an Emergency Use Authorization (EUA). This EUA will remain  in effect (meaning this test can be used) for the duration of the COVID-19 declaration under Section 564(b)(1) of the Act, 21 U.S.C.section 360bbb-3(b)(1), unless the authorization is terminated  or revoked sooner.       Influenza A by PCR NEGATIVE NEGATIVE Final   Influenza B by PCR NEGATIVE NEGATIVE Final    Comment: (NOTE) The Xpert Xpress SARS-CoV-2/FLU/RSV plus assay is  intended as an aid in the diagnosis of influenza from Nasopharyngeal swab specimens and should not be used as a sole basis for treatment. Nasal washings and aspirates are unacceptable for Xpert Xpress SARS-CoV-2/FLU/RSV testing.  Fact Sheet for Patients: BloggerCourse.com  Fact Sheet for Healthcare Providers: SeriousBroker.it  This test is not yet approved or cleared by the [place] FDA and has been authorized for detection and/or diagnosis of SARS-CoV-2 by FDA under an Emergency Use Authorization (EUA). This EUA will remain in effect (meaning this test can be used) for the duration of the COVID-19 declaration under Section 564(b)(1) of the Act, 21 U.S.C. section 360bbb-3(b)(1), unless the authorization is terminated or revoked.  Performed at [facility], [address], [place], [place] [address]   Blood Culture (routine x 2)     Status: None (Preliminary result)   Collection Time: 07/30/20  1:53 AM   Specimen: BLOOD  Result Value Ref Range Status   Specimen Description BLOOD SITE NOT SPECIFIED  Final   Special Requests   Final    BOTTLES DRAWN AEROBIC AND ANAEROBIC Blood Culture adequate volume   Culture   Final    NO GROWTH 3 DAYS Performed at [facility], [address] [place], [place] [address]    Report Status PENDING  Incomplete  MRSA PCR Screening     Status: None   Collection Time: 07/30/20  3:31 PM   Specimen: Nasopharyngeal  Result Value Ref Range Status   MRSA by PCR NEGATIVE NEGATIVE Final    Comment:        The GeneXpert MRSA Assay (FDA approved for NASAL specimens only), is one component of a comprehensive MRSA colonization surveillance program. It is not intended to diagnose MRSA infection nor to guide or monitor treatment for MRSA infections. Performed at [facility], [address] [place], [place] [address]     RADIOLOGY STUDIES/RESULTS: No results found.   LOS: 4  days   Jeoffrey MassedShanker Jhanae Jaskowiak, MD  [facility]    To contact the attending provider between 7A-7P or the covering provider during after hours 7P-7A, please log into the web site [email or web address] and access using universal [place] password for that web site. If you do not have the password, please call the hospital operator.  08/03/2020, 10:25 AM

## 2020-08-03 NOTE — Progress Notes (Signed)
ANTICOAGULATION CONSULT NOTE  Pharmacy Consult for IV Heparin Indication: atrial fibrillation  Allergies  Allergen Reactions  . Penicillins Swelling and Other (See Comments)    Throat swelling/ turned red  . Sulfa Antibiotics Swelling and Other (See Comments)    Throat swelling/turned red    Patient Measurements: Height: 5\' 9"  (175.3 cm) Weight: 98.1 kg (216 lb 4.3 oz) IBW/kg (Calculated) : 66.2 Heparin Dosing Weight: 87 kg   Vital Signs: Temp: 98.7 F (37.1 C) (04/03 0500) Temp Source: Oral (04/03 0500) BP: 126/69 (04/03 0500) Pulse Rate: 80 (04/03 0500)  Labs: Recent Labs    07/31/20 0924 07/31/20 1509 08/01/20 0501 08/02/20 0048 08/03/20 0011 08/03/20 0615  HGB  --    < > 10.8* 10.2* 11.1*  --   HCT  --   --  36.0 32.6* 34.9*  --   PLT  --   --  151 145* 71*  --   HEPARINUNFRC  --   --  0.58 0.32 0.35  --   CREATININE  --    < > 1.11* 0.93 0.79  --   TROPONINIHS 149*  --   --   --  40* 38*   < > = values in this interval not displayed.    Estimated Creatinine Clearance: 82.8 mL/min (by C-G formula based on SCr of 0.79 mg/dL).  Assessment: 73 YOF presented with severe sepsis and was found to be in atrial fibrillation with RVR.  Recently admission for CVA and discharged on ASA and Plavix as there was no finding of Afib.  Pharmacy was consulted to dose IV heparin.  CBC: Hgb 11.1, Plt 71  Heparin level therapeutic 0.35 ; no bleeding and no issues with the IV line or infusion reported per RN   Goal of Therapy:  Heparin level 0.3-0.7 units/ml Monitor platelets by anticoagulation protocol: Yes   Plan:  Continue heparin at 1150 units/hr Daily heparin level and CBC F/u long term anticoagulation plans when able to take oral medications  78, PharmD PGY1 Pharmacy Resident 08/03/2020 7:33 AM

## 2020-08-04 DIAGNOSIS — N390 Urinary tract infection, site not specified: Secondary | ICD-10-CM | POA: Diagnosis not present

## 2020-08-04 DIAGNOSIS — A021 Salmonella sepsis: Secondary | ICD-10-CM | POA: Diagnosis not present

## 2020-08-04 DIAGNOSIS — N170 Acute kidney failure with tubular necrosis: Secondary | ICD-10-CM | POA: Diagnosis not present

## 2020-08-04 DIAGNOSIS — N179 Acute kidney failure, unspecified: Secondary | ICD-10-CM | POA: Diagnosis not present

## 2020-08-04 LAB — PHOSPHORUS: Phosphorus: 1.6 mg/dL — ABNORMAL LOW (ref 2.5–4.6)

## 2020-08-04 LAB — CBC
HCT: 36 % (ref 36.0–46.0)
Hemoglobin: 11 g/dL — ABNORMAL LOW (ref 12.0–15.0)
MCH: 27.5 pg (ref 26.0–34.0)
MCHC: 30.6 g/dL (ref 30.0–36.0)
MCV: 90 fL (ref 80.0–100.0)
Platelets: 143 10*3/uL — ABNORMAL LOW (ref 150–400)
RBC: 4 MIL/uL (ref 3.87–5.11)
RDW: 15.7 % — ABNORMAL HIGH (ref 11.5–15.5)
WBC: 6.4 10*3/uL (ref 4.0–10.5)
nRBC: 0 % (ref 0.0–0.2)

## 2020-08-04 LAB — BASIC METABOLIC PANEL
Anion gap: 6 (ref 5–15)
BUN: 17 mg/dL (ref 8–23)
CO2: 25 mmol/L (ref 22–32)
Calcium: 8.5 mg/dL — ABNORMAL LOW (ref 8.9–10.3)
Chloride: 115 mmol/L — ABNORMAL HIGH (ref 98–111)
Creatinine, Ser: 0.73 mg/dL (ref 0.44–1.00)
GFR, Estimated: >60 mL/min (ref >60)
Glucose, Bld: 177 mg/dL — ABNORMAL HIGH (ref 70–99)
Potassium: 4.5 mmol/L (ref 3.5–5.1)
Sodium: 146 mmol/L — ABNORMAL HIGH (ref 135–145)

## 2020-08-04 LAB — GLUCOSE, CAPILLARY
Glucose-Capillary: 158 mg/dL — ABNORMAL HIGH (ref 70–99)
Glucose-Capillary: 157 mg/dL — ABNORMAL HIGH (ref 70–99)
Glucose-Capillary: 180 mg/dL — ABNORMAL HIGH (ref 70–99)
Glucose-Capillary: 188 mg/dL — ABNORMAL HIGH (ref 70–99)
Glucose-Capillary: 159 mg/dL — ABNORMAL HIGH (ref 70–99)
Glucose-Capillary: 164 mg/dL — ABNORMAL HIGH (ref 70–99)

## 2020-08-04 LAB — MAGNESIUM: Magnesium: 2.3 mg/dL (ref 1.7–2.4)

## 2020-08-04 LAB — HEPARIN LEVEL (UNFRACTIONATED): Heparin Unfractionated: 0.24 IU/mL — ABNORMAL LOW (ref 0.30–0.70)

## 2020-08-04 MED ORDER — ENOXAPARIN SODIUM 100 MG/ML ~~LOC~~ SOLN
100.0000 mg | Freq: Two times a day (BID) | SUBCUTANEOUS | Status: DC
  Administered 2020-08-04 – 2020-08-06 (×5): 100 mg via SUBCUTANEOUS
  Filled 2020-08-04 (×5): qty 1

## 2020-08-04 MED ORDER — POTASSIUM PHOSPHATES 15 MMOLE/5ML IV SOLN
30.0000 mmol | Freq: Once | INTRAVENOUS | Status: AC
  Administered 2020-08-04: 30 mmol via INTRAVENOUS
  Filled 2020-08-04: qty 10

## 2020-08-04 NOTE — Progress Notes (Addendum)
PROGRESS NOTE        PATIENT DETAILS Name: Kristy Jensen Age: 70 y.o. Sex: female Date of Birth: 04/27/1951 Admit Date: 07/30/2020 Admitting Physician Steffanie Dunn, DO YNW:GNFAOZH, No Pcp Per (Inactive)  Brief Narrative: Patient is a 70 y.o. female with history of CVA, PAF, HLD, HTN presenting to the hospital with altered mental status, A. fib with RVR, hyperkalemia and septic shock.  Significant events: 3/30>> admit to ICU 4/2>> transferred to [facility] 4/4>> dysphagia 2 diet started  Significant studies: 2/13>> Echo: EF 55-60%, grade 1 diastolic dysfunction. 3/30>> CT head: No acute intracranial abnormality. 3/30>> CT renal stone study: Bibasilar pulmonary infiltrates, no acute [place] abdominal pathology. 3/30>> chest x-ray: Stable bibasilar pulmonary fibrotic changes. 3/30>> MRI brain: Increased size of acute to subacute infarcts of the right basal ganglia.  Antimicrobial therapy: Aztreonam: 3/29>>4/3 Flagyl: 3/29>> 3/30 Vancomycin: 3/29 x 1  Microbiology data: 3/30>> urine culture:<10,000 colonies/mL 3/30>> 1/2 blood culture: Gram-positive cocci in clusters-likely contaminant  Procedures : None  Consults: CCM, neurology  DVT Prophylaxis : SCDs Start: 07/30/20 0865  Therapeutic Lovenox  Subjective: More awake and alert-has a good cough reflex.  Assessment/Plan: Septic shock due to complicated UTI: Sepsis physiology has improved-culture data as above-although urine cultures essentially negative-being treated as complicated UTI.  Has completed a course of aztreonam.  Acute metabolic encephalopathy: Felt to be due to AKI/sepsis-improving-minimally confused today-answering simple questions.  Continue supportive care.  PAF with RVR-SVT: Remains in sinus rhythm-on beta-blocker and therapeutic anticoagulation with Lovenox.  AKI: Likely hemodynamically mediated-resolved.  Hypokalemia/hypomagnesemia/hypophosphatemia: Potassium and magnesium  within normal limits-phosphorus being repleted.  Hypernatremia: Mild hyponatremia today-on free water flushes via PEG tube-dysphagia 2 diet being started-reassess electrolytes on 4/5  Normocytic anemia: Due to acute illness-no evidence of blood loss-monitor  Known right thyroid goiter with 1.7 cm nodule: TSH within normal limit-we will require further work-up including an FNA in the outpatient setting.  Dysphagia: Probably chronic-worsened by encephalopathy/acute illness-NG tube in place-he is evaluated by SLP on 4/4-dysphagia 2 diet started-if oral intake is adequate-we can consider removing NG tube on 4/5.  Goals of care: Frail-recurrent hospitalizations-severe dysphagia-debilitated-DNR-palliative care working with family-family meeting set up for midweek once patient's son arrives from [place].  Nutrition Problem: Nutrition Problem: Moderate Malnutrition Etiology: chronic illness Signs/Symptoms: mild fat depletion,moderate muscle depletion,percent weight loss (15.9% weight loss since 06/17/20) Percent weight loss: 15.9 % Interventions: Tube feeding  Obesity: Estimated body mass index is 32.62 kg/m as calculated from the following:   Height as of this encounter:  (1.753 m).   Weight as of this encounter: 100.2 kg.     Diet: Diet Order            DIET DYS 2 Room service appropriate? Yes; Fluid consistency: Thin  Diet effective now                  Code Status: Full code   Family Communication: Daughter-[name]-[phone]- on 4/4 Son-[name] Moody-[phone]-left a voicemail on 4/2.  Disposition Plan: Status is: Inpatient  Remains inpatient appropriate because:IV treatments appropriate due to intensity of illness or inability to take PO and Inpatient level of care appropriate due to severity of illness   Dispo: The patient is from: Home              Anticipated d/c is to: SNF  Patient currently is not medically stable to d/c.   Difficult to place  patient No    Barriers to Discharge: Dysphagia-NG tube feeding in place-remains n.p.o.  Antimicrobial agents: Anti-infectives (From admission, onward)   Start     Dose/Rate Route Frequency Ordered Stop   08/02/20 1800  aztreonam (AZACTAM) 1 g in sodium chloride 0.9 % 100 mL IVPB  Status:  Discontinued        1 g 200 mL/hr over 30 Minutes Intravenous Every 8 hours 08/02/20 0941 08/02/20 0947   08/02/20 1045  aztreonam (AZACTAM) 1 g in sodium chloride 0.9 % 100 mL IVPB        1 g 200 mL/hr over 30 Minutes Intravenous Every 8 hours 08/02/20 0947 08/03/20 1756   07/31/20 0800  vancomycin (VANCOREADY) IVPB 750 mg/150 mL  Status:  Discontinued        750 mg 150 mL/hr over 60 Minutes Intravenous Every 24 hours 07/31/20 0718 07/31/20 0816   07/30/20 1100  aztreonam (AZACTAM) 1 g in sodium chloride 0.9 % 100 mL IVPB  Status:  Discontinued        1 g 200 mL/hr over 30 Minutes Intravenous Every 8 hours 07/30/20 0329 08/02/20 0941   07/30/20 1000  metroNIDAZOLE (FLAGYL) IVPB 500 mg  Status:  Discontinued        500 mg 100 mL/hr over 60 Minutes Intravenous Every 8 hours 07/30/20 0408 07/31/20 0816   07/30/20 0415  vancomycin (VANCOREADY) IVPB 1000 mg/200 mL  Status:  Discontinued        1,000 mg 200 mL/hr over 60 Minutes Intravenous  Once 07/30/20 0408 07/30/20 0410   07/30/20 0329  vancomycin variable dose per unstable renal function (pharmacist dosing)  Status:  Discontinued         Does not apply See admin instructions 07/30/20 0329 07/31/20 0757   07/30/20 0115  vancomycin (VANCOREADY) IVPB 2000 mg/400 mL        2,000 mg 200 mL/hr over 120 Minutes Intravenous  Once 07/30/20 0103 07/30/20 0534   07/30/20 0100  aztreonam (AZACTAM) 2 g in sodium chloride 0.9 % 100 mL IVPB        2 g 200 mL/hr over 30 Minutes Intravenous  Once 07/30/20 0059 07/30/20 0214   07/30/20 0100  metroNIDAZOLE (FLAGYL) IVPB 500 mg        500 mg 100 mL/hr over 60 Minutes Intravenous  Once 07/30/20 0059 07/30/20 0341    07/30/20 0100  vancomycin (VANCOREADY) IVPB 1000 mg/200 mL  Status:  Discontinued        1,000 mg 200 mL/hr over 60 Minutes Intravenous  Once 07/30/20 0059 07/30/20 0103       Time spent: 25- minutes-Greater than 50% of this time was spent in counseling, explanation of diagnosis, planning of further management, and coordination of care.  MEDICATIONS: Scheduled Meds: . chlorhexidine  15 mL Mouth Rinse BID  . Chlorhexidine Gluconate Cloth  6 each Topical Daily  . enoxaparin (LOVENOX) injection  100 mg Subcutaneous Q12H  . free water  100 mL Per Tube Q8H  . insulin aspart  0-6 Units Subcutaneous Q4H  . mouth rinse  15 mL Mouth Rinse q12n4p  . metoprolol tartrate  5 mg Intravenous Q8H   Continuous Infusions: . sodium chloride 10 mL/hr at 08/02/20 0308  . sodium chloride    . feeding supplement (OSMOLITE 1.2 CAL) 1,000 mL (08/03/20 2020)  . potassium PHOSPHATE IVPB (in mmol) 30 mmol (08/04/20 1100)   PRN Meds:.sodium chloride,  acetaminophen **OR** acetaminophen, docusate sodium, polyethylene glycol   PHYSICAL EXAM: Vital signs: Vitals:   08/04/20 0700 08/04/20 0900 08/04/20 1122 08/04/20 1127  BP: (!) 111/52  (!) 88/70 101/81  Pulse:   78   Resp: 11  12   Temp:  97.8 F (36.6 C) 97.8 F (36.6 C)   TempSrc:  Axillary Oral   SpO2:   100%   Weight:      Height:       Filed Weights   07/31/20 0500 08/02/20 0500 08/04/20 0517  Weight: 96 kg 98.1 kg 100.2 kg   Body mass index is 32.62 kg/m.   Gen Exam: Mildly confused-not in any distress HEENT:atraumatic, normocephalic Chest: B/L clear to auscultation anteriorly CVS:S1S2 regular Abdomen:soft non tender, non distended Extremities:no edema Neurology: Non focal Skin: no rash  I have personally reviewed following labs and imaging studies  LABORATORY DATA: CBC: Recent Labs  Lab 07/30/20 0059 07/30/20 0517 07/30/20 1124 07/31/20 0354 08/01/20 0501 08/02/20 0048 08/03/20 0011 08/04/20 0359  WBC 19.8* 14.4*  --   9.6 6.1 5.3 5.6 6.4  NEUTROABS 17.1* 11.9*  --   --   --   --   --   --   HGB 15.1* 13.3   < > 11.4* 10.8* 10.2* 11.1* 11.0*  HCT 48.2* 45.7   < > 36.2 36.0 32.6* 34.9* 36.0  MCV 90.9 97.0  --  90.0 92.5 90.1 87.9 90.0  PLT 247 185  --  159 151 145* 71* 143*   < > = values in this interval not displayed.    Basic Metabolic Panel: Recent Labs  Lab 07/31/20 1509 07/31/20 1945 08/01/20 0501 08/02/20 0048 08/03/20 0011 08/04/20 0359  NA 152*  --  148* 142 143  143 146*  K 3.8  --  3.6 3.3* 4.1  4.1 4.5  CL 118*  --  115* 113* 114*  115* 115*  CO2 23  --  GLUCOSE 154*  --  124* 161* 146*  151* 177*  BUN 71*  --  51* 30* CREATININE 1.49*  --  1.11* 0.93 0.84  0.79 0.73  CALCIUM 8.2*  --  8.0* 7.7* 8.2*  8.1* 8.5*  MG  --  1.9 1.8 1.5* 1.6*  1.6* 2.3  PHOS  --  2.9 1.9* 2.2* 1.9* 1.6*    GFR: Estimated Creatinine Clearance: 83.6 mL/min (by C-G formula based on SCr of 0.73 mg/dL).  Liver Function Tests: Recent Labs  Lab 07/30/20 0059 07/30/20 0517 08/03/20 0011  AST 15 17  --   ALT 10 6  --   ALKPHOS 71 65  --   BILITOT 0.9 0.8  --   PROT 6.7 5.8*  --   ALBUMIN 3.3* 2.7* 2.2*   Recent Labs  Lab 07/30/20 0837 07/31/20 0924  LIPASE 145* 69*  AMYLASE 280*  --    No results for input(s): AMMONIA in the last 168 hours.  Coagulation Profile: Recent Labs  Lab 07/30/20 0059  INR 1.4*    Cardiac Enzymes: No results for input(s): CKTOTAL, CKMB, CKMBINDEX, TROPONINI in the last 168 hours.  BNP (last 3 results) No results for input(s): PROBNP in the last 8760 hours.  Lipid Profile: No results for input(s): CHOL, HDL, LDLCALC, TRIG, CHOLHDL, LDLDIRECT in the last 72 hours.  Thyroid Function Tests: No results for input(s): TSH, T4TOTAL, FREET4, T3FREE, THYROIDAB in the last 72 hours.  Anemia Panel: No results  for input(s): VITAMINB12, FOLATE, FERRITIN, TIBC, IRON, RETICCTPCT in the last 72 hours.  Urine analysis:     Component Value Date/Time   COLORURINE AMBER (A) 07/30/2020 0214   APPEARANCEUR TURBID (A) 07/30/2020 0214   LABSPEC 1.017 07/30/2020 0214   PHURINE 5.0 07/30/2020 0214   GLUCOSEU 50 (A) 07/30/2020 0214   HGBUR MODERATE (A) 07/30/2020 0214   BILIRUBINUR NEGATIVE 07/30/2020 0214   KETONESUR 5 (A) 07/30/2020 0214   PROTEINUR 100 (A) 07/30/2020 0214   NITRITE NEGATIVE 07/30/2020 0214   LEUKOCYTESUR MODERATE (A) 07/30/2020 0214    Sepsis Labs: Lactic Acid, Venous    Component Value Date/Time   LATICACIDVEN 2.1 (HH) 07/31/2020 0924    MICROBIOLOGY: Recent Results (from the past 240 hour(s))  Urine culture     Status: Abnormal   Collection Time: 07/30/20 12:59 AM   Specimen: In/Out Cath Urine  Result Value Ref Range Status   Specimen Description IN/OUT CATH URINE  Final   Special Requests NONE  Final   Culture (A)  Final    <10,000 COLONIES/mL INSIGNIFICANT GROWTH Performed at [facility], [address] [place], [place] [address]    Report Status 07/31/2020 FINAL  Final  Blood Culture (routine x 2)     Status: Abnormal   Collection Time: 07/30/20  1:27 AM   Specimen: BLOOD LEFT WRIST  Result Value Ref Range Status   Specimen Description BLOOD LEFT WRIST  Final   Special Requests   Final    BOTTLES DRAWN AEROBIC AND ANAEROBIC Blood Culture adequate volume   Culture  Setup Time   Final    GRAM POSITIVE COCCI IN CLUSTERS AEROBIC BOTTLE ONLY CRITICAL RESULT CALLED TO, READ BACK BY AND VERIFIED WITH: A WOLFE PHARMD 1417 08/01/20 A BROWNING    Culture (A)  Final    STAPHYLOCOCCUS CAPITIS THE SIGNIFICANCE OF ISOLATING THIS ORGANISM FROM A SINGLE SET OF BLOOD CULTURES WHEN MULTIPLE SETS ARE DRAWN IS UNCERTAIN. PLEASE NOTIFY THE MICROBIOLOGY DEPARTMENT WITHIN ONE WEEK IF SPECIATION AND SENSITIVITIES ARE REQUIRED. Performed at [facility], [address], [place], [place] [address]    Report Status 08/03/2020 FINAL  Final  Blood Culture ID Panel (Reflexed)      Status: Abnormal   Collection Time: 07/30/20  1:27 AM  Result Value Ref Range Status   Enterococcus faecalis NOT DETECTED NOT DETECTED Final   Enterococcus Faecium NOT DETECTED NOT DETECTED Final   Listeria monocytogenes NOT DETECTED NOT DETECTED Final   Staphylococcus species DETECTED (A) NOT DETECTED Final    Comment: CRITICAL RESULT CALLED TO, READ BACK BY AND VERIFIED WITH: A WOLFE PHARMD 1417 08/01/20 A BROWNING    Staphylococcus aureus (BCID) NOT DETECTED NOT DETECTED Final   Staphylococcus epidermidis NOT DETECTED NOT DETECTED Final   Staphylococcus lugdunensis NOT DETECTED NOT DETECTED Final   Streptococcus species NOT DETECTED NOT DETECTED Final   Streptococcus agalactiae NOT DETECTED NOT DETECTED Final   Streptococcus pneumoniae NOT DETECTED NOT DETECTED Final   Streptococcus pyogenes NOT DETECTED NOT DETECTED Final   A.calcoaceticus-baumannii NOT DETECTED NOT DETECTED Final   Bacteroides fragilis NOT DETECTED NOT DETECTED Final   Enterobacterales NOT DETECTED NOT DETECTED Final   Enterobacter cloacae complex NOT DETECTED NOT DETECTED Final   Escherichia coli NOT DETECTED NOT DETECTED Final   Klebsiella aerogenes NOT DETECTED NOT DETECTED Final   Klebsiella oxytoca NOT DETECTED NOT DETECTED Final   Klebsiella pneumoniae NOT DETECTED NOT DETECTED Final   Proteus species NOT DETECTED NOT DETECTED  Final   Salmonella species NOT DETECTED NOT DETECTED Final   Serratia marcescens NOT DETECTED NOT DETECTED Final   Haemophilus influenzae NOT DETECTED NOT DETECTED Final   Neisseria meningitidis NOT DETECTED NOT DETECTED Final   Pseudomonas aeruginosa NOT DETECTED NOT DETECTED Final   Stenotrophomonas maltophilia NOT DETECTED NOT DETECTED Final   Candida albicans NOT DETECTED NOT DETECTED Final   Candida auris NOT DETECTED NOT DETECTED Final   Candida glabrata NOT DETECTED NOT DETECTED Final   Candida krusei NOT DETECTED NOT DETECTED Final   Candida parapsilosis NOT DETECTED NOT  DETECTED Final   Candida tropicalis NOT DETECTED NOT DETECTED Final   Cryptococcus neoformans/gattii NOT DETECTED NOT DETECTED Final    Comment: Performed at [facility], [address] [place], [place] [address]  Resp Panel by RT-PCR (Flu A&B, Covid) Nasopharyngeal Swab     Status: None   Collection Time: 07/30/20  1:45 AM   Specimen: Nasopharyngeal Swab; Nasopharyngeal(NP) swabs in vial transport medium  Result Value Ref Range Status   SARS Coronavirus 2 by RT PCR NEGATIVE NEGATIVE Final    Comment: (NOTE) SARS-CoV-2 target nucleic acids are NOT DETECTED.  The SARS-CoV-2 RNA is generally detectable in upper respiratory specimens during the acute phase of infection. The lowest concentration of SARS-CoV-2 viral copies this assay can detect is 138 copies/mL. A negative result does not preclude SARS-Cov-2 infection and should not be used as the sole basis for treatment or other patient management decisions. A negative result may occur with  improper specimen collection/handling, submission of specimen other than nasopharyngeal swab, presence of viral mutation(s) within the areas targeted by this assay, and inadequate number of viral copies(<138 copies/mL). A negative result must be combined with clinical observations, patient history, and epidemiological information. The expected result is Negative.  Fact Sheet for Patients:  BloggerCourse.comhttps://www.fda.gov/media/152166/download  Fact Sheet for Healthcare Providers:  SeriousBroker.ithttps://www.fda.gov/media/152162/download  This test is no t yet approved or cleared by the [place] FDA and  has been authorized for detection and/or diagnosis of SARS-CoV-2 by FDA under an Emergency Use Authorization (EUA). This EUA will remain  in effect (meaning this test can be used) for the duration of the COVID-19 declaration under Section 564(b)(1) of the Act, 21 U.S.C.section 360bbb-3(b)(1), unless the authorization is terminated  or revoked sooner.        Influenza A by PCR NEGATIVE NEGATIVE Final   Influenza B by PCR NEGATIVE NEGATIVE Final    Comment: (NOTE) The Xpert Xpress SARS-CoV-2/FLU/RSV plus assay is intended as an aid in the diagnosis of influenza from Nasopharyngeal swab specimens and should not be used as a sole basis for treatment. Nasal washings and aspirates are unacceptable for Xpert Xpress SARS-CoV-2/FLU/RSV testing.  Fact Sheet for Patients: BloggerCourse.comhttps://www.fda.gov/media/152166/download  Fact Sheet for Healthcare Providers: SeriousBroker.ithttps://www.fda.gov/media/152162/download  This test is not yet approved or cleared by the [place] FDA and has been authorized for detection and/or diagnosis of SARS-CoV-2 by FDA under an Emergency Use Authorization (EUA). This EUA will remain in effect (meaning this test can be used) for the duration of the COVID-19 declaration under Section 564(b)(1) of the Act, 21 U.S.C. section 360bbb-3(b)(1), unless the authorization is terminated or revoked.  Performed at [facility], [address] [place], [place] [address]   Blood Culture (routine x 2)     Status: None (Preliminary result)   Collection Time: 07/30/20  1:53 AM   Specimen: BLOOD  Result Value Ref Range Status   Specimen Description BLOOD SITE  NOT SPECIFIED  Final   Special Requests   Final    BOTTLES DRAWN AEROBIC AND ANAEROBIC Blood Culture adequate volume   Culture   Final    NO GROWTH 4 DAYS Performed at [facility], [address] [place], [place] [address]    Report Status PENDING  Incomplete  MRSA PCR Screening     Status: None   Collection Time: 07/30/20  3:31 PM   Specimen: Nasopharyngeal  Result Value Ref Range Status   MRSA by PCR NEGATIVE NEGATIVE Final    Comment:        The GeneXpert MRSA Assay (FDA approved for NASAL specimens only), is one component of a comprehensive MRSA colonization surveillance program. It is not intended to diagnose MRSA infection nor to guide or monitor treatment  for MRSA infections. Performed at [facility], [address] [place], [place] [address]     RADIOLOGY STUDIES/RESULTS: No results found.   LOS: 5 days   Jeoffrey Massed, MD  [facility]    To contact the attending provider between 7A-7P or the covering provider during after hours 7P-7A, please log into the web site [email or web address] and access using universal [place] password for that web site. If you do not have the password, please call the hospital operator.  08/04/2020, 11:59 AM

## 2020-08-04 NOTE — TOC Initial Note (Signed)
Transition of Care [facility]) - Initial/Assessment Note    Patient Details  Name: Kristy Jensen MRN: [id number] Date of Birth: [birth year]  Transition of [facility]) CM/SW Contact:    Lorri Frederick, LCSW Phone Number: 08/04/2020, 4:15 PM  Clinical Narrative:    CSW spoke with pt who was not able to fully participate in assessment.  Permission given to speak to pt children, CSW spoke with pt daughter [name] who confirms that her brother is out of the county, returning tomorrow afternoon, and plan is to meet with palliative with all of the pt's children on 4/6.  CSW discussed starting the SNF referral process now, however, [name] did not want to do this.  She reports pt was a [place] [facility] for 6 weeks previously and it did not go well, she is very hesitant to return her to another SNF.  Pt does have a place at [facility] but [name] is not sure she meets criteria to be able to return currently.  She is not yet sure what they will do if they do not choose full comfort care, but wants to have the discussion with her siblings before making any decisions.               Expected Discharge Plan:  (TBD) Barriers to Discharge: Continued Medical Work up,Other (comment) (identification of goals of care)   Patient Goals and CMS Choice Patient states their goals for this hospitalization and ongoing recovery are:: TBD CMS Medicare.gov Compare Post Acute Care list provided to:: Patient (SNF choices) Choice offered to / list presented to : Patient  Expected Discharge Plan and Services Expected Discharge Plan:  (TBD)     Post Acute Care Choice:  (TBD) Living arrangements for the past 2 months: Skilled Nursing Facility [facility])                                      Prior Living Arrangements/Services Living arrangements for the past 2 months: Skilled Nursing Facility ([facility]) Lives with:: Facility Resident Patient language and need for interpreter reviewed:: Yes         Need for Family Participation in Patient Care: Yes (Comment) Care giver support system in place?: Yes (comment)   Criminal Activity/Legal Involvement Pertinent to Current Situation/Hospitalization: No - Comment as needed  Activities of Daily Living      Permission Sought/Granted Permission sought to share information with : Family Supports Permission granted to share information with : Yes, Verbal Permission Granted  Share Information with NAME: adult children           Emotional Assessment Appearance:: Appears stated age Attitude/Demeanor/Rapport: Engaged Affect (typically observed): Appropriate Orientation: : Oriented to Self,Oriented to Place,Oriented to  Time,Oriented to Situation Alcohol / Substance Use: Not Applicable Psych Involvement: No (comment)  Admission diagnosis:  Hyperkalemia [E87.5] Hypernatremia [E87.0] Shock (HCC) [R57.9] Sepsis (HCC) [A41.9] Acute renal failure, unspecified acute renal failure type (HCC) [N17.9] Atrial fibrillation, unspecified type (HCC) [I48.91] Sepsis, due to unspecified organism, unspecified whether acute organ dysfunction present [facility]) [A41.9] Patient Active Problem List   Diagnosis Date Noted  . Hypomagnesemia 08/03/2020  . SVT (supraventricular tachycardia) (HCC) 08/03/2020  . Malnutrition of moderate degree 08/01/2020  . Atrial fibrillation (HCC)   . Sepsis (HCC) 07/30/2020  . ARF (acute renal failure) (HCC) 07/30/2020  . Shock (HCC) 07/30/2020  . Pressure injury of skin 07/30/2020  . Altered mental status 06/14/2020  .  Metabolic encephalopathy 06/13/2020  . CVA (cerebral vascular accident) (HCC) 06/13/2020  . Acute lower UTI 06/13/2020  . Hyperglycemia 06/13/2020   PCP:  Patient, No Pcp Per (Inactive) Pharmacy:   [id number] - [place], [place] - [address] AT [address] OF [facility] [address] [place] [address] Phone: [phone] Fax: [phone]  [facility] -  [place], [place] - [address] [place], [address] [place], [address] [place] [address] Phone: [phone] Fax: [phone]     Social Determinants of Health (SDOH) Interventions    Readmission Risk Interventions No flowsheet data found.

## 2020-08-04 NOTE — Progress Notes (Addendum)
ANTICOAGULATION CONSULT NOTE  Pharmacy Consult for IV Heparin>>Lovenox Indication: atrial fibrillation  Allergies  Allergen Reactions  . Penicillins Swelling and Other (See Comments)    Throat swelling/ turned red  . Sulfa Antibiotics Swelling and Other (See Comments)    Throat swelling/turned red    Patient Measurements: Height: 5\' 9"  (175.3 cm) Weight: 100.2 kg (220 lb 14.4 oz) IBW/kg (Calculated) : 66.2 Heparin Dosing Weight: 87 kg   Vital Signs: Temp: 97.6 F (36.4 C) (04/04 0500) Temp Source: Oral (04/04 0500) BP: 111/52 (04/04 0700) Pulse Rate: 65 (04/04 0500)  Labs: Recent Labs    08/02/20 0048 08/03/20 0011 08/03/20 0615 08/04/20 0359  HGB 10.2* 11.1*  --  11.0*  HCT 32.6* 34.9*  --  36.0  PLT 145* 71*  --  143*  HEPARINUNFRC 0.32 0.35  --  0.24*  CREATININE 0.93 0.84  0.79  --  0.73  TROPONINIHS  --  40* 38*  --     Estimated Creatinine Clearance: 83.6 mL/min (by C-G formula based on SCr of 0.73 mg/dL).  Assessment: 30 YOF presented with severe sepsis and was found to be in atrial fibrillation with RVR.  Recently admission for CVA and discharged on ASA and Plavix as there was no finding of Afib.  Pharmacy was consulted to dose IV heparin.  Heparin was subtherapeutic this AM. Pt is not ready to PO yet. D/w Dr 78 and we will transition her to Lovenox for now.   Goal of Therapy:   Anti-Xa 0.6-1 Monitor platelets by anticoagulation protocol: Yes   Plan:  Dc heparin Lovenox 100mg  SQ q12 F/u long term anticoagulation plans when able to take oral medications  Jerral Ralph, PharmD, BCIDP, AAHIVP, CPP Infectious Disease Pharmacist 08/04/2020 8:44 AM  Addendum  Phos is low at 1.6 this AM. Ok to replace phos again today per Dr. Ulyses Southward.   [facility] 10/04/2020 IV x1  Jerral Ralph, PharmD, [place], AAHIVP, CPP Infectious Disease Pharmacist 08/04/2020 9:03 AM

## 2020-08-04 NOTE — Care Management Important Message (Signed)
Important Message  Patient Details  Name: Kristy Jensen MRN: [id number] Date of Birth: [birth year]   Medicare Important Message Given:  Yes     [name] 08/04/2020, 1:27 PM

## 2020-08-04 NOTE — Progress Notes (Signed)
  Speech Language Pathology Treatment: Dysphagia  Patient Details Name: Kristy Jensen MRN: [id number] DOB: October 11, 1950 Today's Date: 08/04/2020 Time: 7517-0017 SLP Time Calculation (min) (ACUTE ONLY): 30 min  Assessment / Plan / Recommendation Clinical Impression  Patient seen to address dysphagia goals with PO trials of thin liquids (via cup and straw sips), puree solids and regular solids. Prior to PO's, SLP performed oral care and removed thick secretions and some semi-hardened secretions on roof of mouth and along gumline. Patient able to hold cup and bring to mouth after minimal cues. No overt s/s aspiration, penetration observed with thin liquids, puree solids, regular solids (small piece of cracker). Patient able to masticate small piece of cracker and no oral residuals noted post swallows. Patient is confused (telling SLP she's in "[place]" when asked orientation question), but she remained alert throughout session and did not require cues to maintain alertness. Recommend initiate Dys 2, thin liquids diet with full supervision and assistance as needed.    HPI HPI: Pt is a 70 year old female with a history of CVA, urinary tract infections, atrial fibrillation ventricular response and SVT.  She was discharged in February 2022 following urinary tract infection and altered mental status. She presented to the ED on 3/30 with altered mental status  and ventricular heart rate with atrial fibrillation. 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 MRI brain completed due to encephalopathy on 3/30: Increased size of acute to subacute infarcts of the right basal ganglia. CXR 3/30: Stable bibasilar pulmonary fibrotic changes. Cortrak placed 4/1      SLP Plan  Continue with current plan of care       Recommendations  Diet recommendations: Thin liquid;Dysphagia 2 (fine chop) Liquids provided via: Cup;Straw Medication Administration: Whole meds with  puree Supervision: Patient able to self feed;Staff to assist with self feeding;Full supervision/cueing for compensatory strategies Compensations: Minimize environmental distractions;Slow rate;Small sips/bites;Other (Comment) (oral care after PO's) Postural Changes and/or Swallow Maneuvers: Seated upright 90 degrees                Oral Care Recommendations: Oral care QID Follow up Recommendations: Other (comment) (TBD) SLP Visit Diagnosis: Dysphagia, unspecified (R13.10) Plan: Continue with current plan of care       GO               Angela Nevin, MA, CCC-SLP Speech Therapy [facility]

## 2020-08-04 NOTE — Progress Notes (Signed)
ANTICOAGULATION CONSULT NOTE  Pharmacy Consult for IV Heparin Indication: atrial fibrillation   Labs: Recent Labs    08/02/20 0048 08/03/20 0011 08/03/20 0615 08/04/20 0359  HGB 10.2* 11.1*  --  11.0*  HCT 32.6* 34.9*  --  36.0  PLT 145* 71*  --  143*  HEPARINUNFRC 0.32 0.35  --  0.24*  CREATININE 0.93 0.84  0.79  --  0.73  TROPONINIHS  --  40* 38*  --     Assessment: 64 YOF presented with severe sepsis and was found to be in atrial fibrillation with RVR.  Recently admission for CVA and discharged on ASA and Plavix as there was no finding of Afib.  Pharmacy was consulted to dose IV heparin.  Heparin level this am 0.24 units/ml.  No bleeding reported  Goal of Therapy:  Heparin level 0.3-0.7 units/ml Monitor platelets by anticoagulation protocol: Yes   Plan:  Increase heparin to 1250 units/hr Daily heparin level and CBC F/u long term anticoagulation plans when able to take oral medications  Thanks for allowing pharmacy to be a part of this patient's care.  Talbert Cage, PharmD Clinical Pharmacist  08/04/2020 5:28 AM

## 2020-08-05 DIAGNOSIS — N170 Acute kidney failure with tubular necrosis: Secondary | ICD-10-CM | POA: Diagnosis not present

## 2020-08-05 DIAGNOSIS — A021 Salmonella sepsis: Secondary | ICD-10-CM | POA: Diagnosis not present

## 2020-08-05 DIAGNOSIS — N179 Acute kidney failure, unspecified: Secondary | ICD-10-CM | POA: Diagnosis not present

## 2020-08-05 DIAGNOSIS — N390 Urinary tract infection, site not specified: Secondary | ICD-10-CM | POA: Diagnosis not present

## 2020-08-05 LAB — BASIC METABOLIC PANEL
Anion gap: 5 (ref 5–15)
BUN: 13 mg/dL (ref 8–23)
CO2: 25 mmol/L (ref 22–32)
Calcium: 8.5 mg/dL — ABNORMAL LOW (ref 8.9–10.3)
Chloride: 111 mmol/L (ref 98–111)
Creatinine, Ser: 0.7 mg/dL (ref 0.44–1.00)
GFR, Estimated: >60 mL/min (ref >60)
Glucose, Bld: 186 mg/dL — ABNORMAL HIGH (ref 70–99)
Potassium: 4.9 mmol/L (ref 3.5–5.1)
Sodium: 141 mmol/L (ref 135–145)

## 2020-08-05 LAB — CBC
HCT: 34.1 % — ABNORMAL LOW (ref 36.0–46.0)
Hemoglobin: 10.6 g/dL — ABNORMAL LOW (ref 12.0–15.0)
MCH: 28.1 pg (ref 26.0–34.0)
MCHC: 31.1 g/dL (ref 30.0–36.0)
MCV: 90.5 fL (ref 80.0–100.0)
Platelets: 167 10*3/uL (ref 150–400)
RBC: 3.77 MIL/uL — ABNORMAL LOW (ref 3.87–5.11)
RDW: 15.8 % — ABNORMAL HIGH (ref 11.5–15.5)
WBC: 6.8 10*3/uL (ref 4.0–10.5)
nRBC: 0 % (ref 0.0–0.2)

## 2020-08-05 LAB — PHOSPHORUS: Phosphorus: 2.4 mg/dL — ABNORMAL LOW (ref 2.5–4.6)

## 2020-08-05 LAB — CULTURE, BLOOD (ROUTINE X 2)
Culture: NO GROWTH
Special Requests: ADEQUATE

## 2020-08-05 LAB — GLUCOSE, CAPILLARY
Glucose-Capillary: 114 mg/dL — ABNORMAL HIGH (ref 70–99)
Glucose-Capillary: 152 mg/dL — ABNORMAL HIGH (ref 70–99)
Glucose-Capillary: 161 mg/dL — ABNORMAL HIGH (ref 70–99)
Glucose-Capillary: 172 mg/dL — ABNORMAL HIGH (ref 70–99)
Glucose-Capillary: 194 mg/dL — ABNORMAL HIGH (ref 70–99)
Glucose-Capillary: 217 mg/dL — ABNORMAL HIGH (ref 70–99)

## 2020-08-05 MED ORDER — OSMOLITE 1.5 CAL PO LIQD
1000.0000 mL | ORAL | Status: DC
  Administered 2020-08-05: 1000 mL
  Filled 2020-08-05 (×2): qty 1000

## 2020-08-05 MED ORDER — PROSOURCE TF PO LIQD
45.0000 mL | Freq: Three times a day (TID) | ORAL | Status: DC
  Administered 2020-08-05 – 2020-08-06 (×4): 45 mL
  Filled 2020-08-05 (×3): qty 45

## 2020-08-05 NOTE — Progress Notes (Addendum)
PROGRESS NOTE        PATIENT DETAILS Name: Kristy Jensen Age: 70 y.o. Sex: female Date of Birth: [birth year] Admit Date: 07/30/2020 Admitting Physician Steffanie Dunn, DO WJX:[id number], No Pcp Per (Inactive)  Brief Narrative: Patient is a 70 y.o. female with history of CVA, PAF, HLD, HTN presenting to the hospital with altered mental status, A. fib with RVR, hyperkalemia and septic shock.  Significant events: 3/30>> admit to ICU 4/2>> transferred to [facility] [place] [facility] 4/4>> dysphagia 2 diet started  Significant studies: 2/13>> Echo: EF 55-60%, grade 1 diastolic dysfunction. 3/30>> CT head: No acute intracranial abnormality. 3/30>> CT renal stone study: Bibasilar pulmonary infiltrates, no acute [place] abdominal pathology. 3/30>> chest x-ray: Stable bibasilar pulmonary fibrotic changes. 3/30>> MRI brain: Increased size of acute to subacute infarcts of the right basal ganglia.  Antimicrobial therapy: Aztreonam: 3/29>>4/3 Flagyl: 3/29>> 3/30 Vancomycin: 3/29 x 1  Microbiology data: 3/30>> urine culture:<10,000 colonies/mL 3/30>> 1/2 blood culture: Gram-positive cocci in clusters-likely contaminant  Procedures : None  Consults: CCM, neurology  DVT Prophylaxis : SCDs Start: 07/30/20 5621  Therapeutic Lovenox  Subjective: Sleepy this morning when I saw her earlier-was able to consume some food yesterday.  Claims she is not hungry-not keen on eating breakfast.  Spoke with daughter on 4/5-patient has had poor appetite-poor oral intake for several months even prior to this hospital stay.  Assessment/Plan: Septic shock due to complicated UTI: Sepsis physiology has improved-culture data as above-although urine cultures essentially negative-being treated as complicated UTI.  Has completed a course of aztreonam.  Acute metabolic encephalopathy: Felt to be due to AKI/sepsis-improving-minimally confused today-answering simple questions.  Continue supportive care.   Evaluated by neurology-although MRI brain showed increased size of the subacute infarct-she is not felt to have acute CVA clinically.  PAF with RVR-SVT: Remains in sinus rhythm-on beta-blocker and therapeutic anticoagulation with Lovenox.  AKI: Likely hemodynamically mediated-resolved.  Hypokalemia/hypomagnesemia/hypophosphatemia: Repleted.  Hypernatremia: Resolved-on free water flushes via NG tube.  Normocytic anemia: Due to acute illness-no evidence of blood loss-monitor  Known right thyroid goiter with 1.7 cm nodule: TSH within normal limit-we will require further work-up including an FNA in the outpatient setting.  Recent CVA February 2022: On anticoagulation-encephalopathy not attributed to CVA.  Dysphagia: Probably chronic-worsened by encephalopathy/acute illness-NG tube in place- evaluated by SLP on 4/4-dysphagia 2 diet started-no aspiration noted following initiation of oral intake-however appetite is very poor.  As noted above-Per daughter-has had very poor appetite/poor oral intake over the past several months.  Patient claims that she is just not hungry-is getting tube feedings 24/7-we will switch to nocturnal feeds-to see if she can eat during the daytime.  Palliative care meeting with family scheduled for 4/6-if she continues to exhibit poor oral intake-she will be more than appropriate to transition to comfort measures.  Goals of care: Frail-recurrent hospitalizations-severe dysphagia-debilitated-DNR-palliative care meeting with family on 4/6.  See above regarding dysphagia/poor oral intake.   Nutrition Problem: Nutrition Problem: Moderate Malnutrition Etiology: chronic illness Signs/Symptoms: mild fat depletion,moderate muscle depletion,percent weight loss (15.9% weight loss since 06/17/20) Percent weight loss: 15.9 % Interventions: Tube feeding  Obesity: Estimated body mass index is 32.62 kg/m as calculated from the following:   Height as of this encounter:  (1.753  m).   Weight as of this encounter: 100.2 kg.     Diet: Diet Order  DIET DYS 2 Room service appropriate? Yes; Fluid consistency: Thin  Diet effective now                  Code Status: Full code   Family Communication: Daughter-[name]- on 4/5 Son-[name] Novella-[phone]-left a voicemail on 4/2.  Disposition Plan: Status is: Inpatient  Remains inpatient appropriate because:IV treatments appropriate due to intensity of illness or inability to take PO and Inpatient level of care appropriate due to severity of illness   Dispo: The patient is from: Home              Anticipated d/c is to: SNF              Patient currently is not medically stable to d/c.   Difficult to place patient No    Barriers to Discharge: Dysphagia-NG tube feeding in place-remains n.p.o.  Antimicrobial agents: Anti-infectives (From admission, onward)   Start     Dose/Rate Route Frequency Ordered Stop   08/02/20 1800  aztreonam (AZACTAM) 1 g in sodium chloride 0.9 % 100 mL IVPB  Status:  Discontinued        1 g 200 mL/hr over 30 Minutes Intravenous Every 8 hours 08/02/20 0941 08/02/20 0947   08/02/20 1045  aztreonam (AZACTAM) 1 g in sodium chloride 0.9 % 100 mL IVPB        1 g 200 mL/hr over 30 Minutes Intravenous Every 8 hours 08/02/20 0947 08/03/20 1756   07/31/20 0800  vancomycin (VANCOREADY) IVPB 750 mg/150 mL  Status:  Discontinued        750 mg 150 mL/hr over 60 Minutes Intravenous Every 24 hours 07/31/20 0718 07/31/20 0816   07/30/20 1100  aztreonam (AZACTAM) 1 g in sodium chloride 0.9 % 100 mL IVPB  Status:  Discontinued        1 g 200 mL/hr over 30 Minutes Intravenous Every 8 hours 07/30/20 0329 08/02/20 0941   07/30/20 1000  metroNIDAZOLE (FLAGYL) IVPB 500 mg  Status:  Discontinued        500 mg 100 mL/hr over 60 Minutes Intravenous Every 8 hours 07/30/20 0408 07/31/20 0816   07/30/20 0415  vancomycin (VANCOREADY) IVPB 1000 mg/200 mL  Status:  Discontinued         1,000 mg 200 mL/hr over 60 Minutes Intravenous  Once 07/30/20 0408 07/30/20 0410   07/30/20 0329  vancomycin variable dose per unstable renal function (pharmacist dosing)  Status:  Discontinued         Does not apply See admin instructions 07/30/20 0329 07/31/20 0757   07/30/20 0115  vancomycin (VANCOREADY) IVPB 2000 mg/400 mL        2,000 mg 200 mL/hr over 120 Minutes Intravenous  Once 07/30/20 0103 07/30/20 0534   07/30/20 0100  aztreonam (AZACTAM) 2 g in sodium chloride 0.9 % 100 mL IVPB        2 g 200 mL/hr over 30 Minutes Intravenous  Once 07/30/20 0059 07/30/20 0214   07/30/20 0100  metroNIDAZOLE (FLAGYL) IVPB 500 mg        500 mg 100 mL/hr over 60 Minutes Intravenous  Once 07/30/20 0059 07/30/20 0341   07/30/20 0100  vancomycin (VANCOREADY) IVPB 1000 mg/200 mL  Status:  Discontinued        1,000 mg 200 mL/hr over 60 Minutes Intravenous  Once 07/30/20 0059 07/30/20 0103       Time spent: 25- minutes-Greater than 50% of this time was spent in counseling, explanation of diagnosis, planning of further  management, and coordination of care.  MEDICATIONS: Scheduled Meds: . chlorhexidine  15 mL Mouth Rinse BID  . Chlorhexidine Gluconate Cloth  6 each Topical Daily  . enoxaparin (LOVENOX) injection  100 mg Subcutaneous Q12H  . feeding supplement (OSMOLITE 1.5 CAL)  1,000 mL Per Tube Q24H  . feeding supplement (PROSource TF)  45 mL Per Tube TID  . free water  100 mL Per Tube Q8H  . insulin aspart  0-6 Units Subcutaneous Q4H  . mouth rinse  15 mL Mouth Rinse q12n4p  . metoprolol tartrate  5 mg Intravenous Q8H   Continuous Infusions: . sodium chloride 10 mL/hr at 08/02/20 0308  . sodium chloride     PRN Meds:.sodium chloride, acetaminophen **OR** acetaminophen, docusate sodium, polyethylene glycol   PHYSICAL EXAM: Vital signs: Vitals:   08/05/20 0603 08/05/20 0736 08/05/20 1109 08/05/20 1249  BP:  (!) 145/61 (!) 155/75 110/70  Pulse:    79  Resp:  16 18 15   Temp:  97.9  F (36.6 C) 97.7 F (36.5 C)   TempSrc:  Oral Oral   SpO2:  96% 97% 97%  Weight: 100.2 kg     Height:       Filed Weights   08/02/20 0500 08/04/20 0517 08/05/20 0603  Weight: 98.1 kg 100.2 kg 100.2 kg   Body mass index is 32.62 kg/m.   Gen Exam: Mildly confused-not in any distress HEENT:atraumatic, normocephalic Chest: B/L clear to auscultation anteriorly CVS:S1S2 regular Abdomen:soft non tender, non distended Extremities:no edema Neurology: Non focal Skin: no rash  I have personally reviewed following labs and imaging studies  LABORATORY DATA: CBC: Recent Labs  Lab 07/30/20 0059 07/30/20 0517 07/30/20 1124 08/01/20 0501 08/02/20 0048 08/03/20 0011 08/04/20 0359 08/05/20 0633  WBC 19.8* 14.4*   < > 6.1 5.3 5.6 6.4 6.8  NEUTROABS 17.1* 11.9*  --   --   --   --   --   --   HGB 15.1* 13.3   < > 10.8* 10.2* 11.1* 11.0* 10.6*  HCT 48.2* 45.7   < > 36.0 32.6* 34.9* 36.0 34.1*  MCV 90.9 97.0   < > 92.5 90.1 87.9 90.0 90.5  PLT 247 185   < > 151 145* 71* 143* 167   < > = values in this interval not displayed.    Basic Metabolic Panel: Recent Labs  Lab 07/31/20 1945 08/01/20 0501 08/02/20 0048 08/03/20 0011 08/04/20 0359 08/05/20 0633  NA  --  148* 142 143  143 146* 141  K  --  3.6 3.3* 4.1  4.1 4.5 4.9  CL  --  115* 113* 114*  115* 115* 111  CO2  --  22 22 22  23 25 25   GLUCOSE  --  124* 161* 146*  151* 177* 186*  BUN  --  51* 30* 19  21 17 13   CREATININE  --  1.11* 0.93 0.84  0.79 0.73 0.70  CALCIUM  --  8.0* 7.7* 8.2*  8.1* 8.5* 8.5*  MG 1.9 1.8 1.5* 1.6*  1.6* 2.3  --   PHOS 2.9 1.9* 2.2* 1.9* 1.6* 2.4*    GFR: Estimated Creatinine Clearance: 83.6 mL/min (by C-G formula based on SCr of 0.7 mg/dL).  Liver Function Tests: Recent Labs  Lab 07/30/20 0059 07/30/20 0517 08/03/20 0011  AST 15 17  --   ALT 10 6  --   ALKPHOS 71 65  --   BILITOT 0.9 0.8  --   PROT 6.7 5.8*  --  ALBUMIN 3.3* 2.7* 2.2*   Recent Labs  Lab 07/30/20 0837  07/31/20 0924  LIPASE 145* 69*  AMYLASE 280*  --    No results for input(s): AMMONIA in the last 168 hours.  Coagulation Profile: Recent Labs  Lab 07/30/20 0059  INR 1.4*    Cardiac Enzymes: No results for input(s): CKTOTAL, CKMB, CKMBINDEX, TROPONINI in the last 168 hours.  BNP (last 3 results) No results for input(s): PROBNP in the last 8760 hours.  Lipid Profile: No results for input(s): CHOL, HDL, LDLCALC, TRIG, CHOLHDL, LDLDIRECT in the last 72 hours.  Thyroid Function Tests: No results for input(s): TSH, T4TOTAL, FREET4, T3FREE, THYROIDAB in the last 72 hours.  Anemia Panel: No results for input(s): VITAMINB12, FOLATE, FERRITIN, TIBC, IRON, RETICCTPCT in the last 72 hours.  Urine analysis:    Component Value Date/Time   COLORURINE AMBER (A) 07/30/2020 0214   APPEARANCEUR TURBID (A) 07/30/2020 0214   LABSPEC 1.017 07/30/2020 0214   PHURINE 5.0 07/30/2020 0214   GLUCOSEU 50 (A) 07/30/2020 0214   HGBUR MODERATE (A) 07/30/2020 0214   BILIRUBINUR NEGATIVE 07/30/2020 0214   KETONESUR 5 (A) 07/30/2020 0214   PROTEINUR 100 (A) 07/30/2020 0214   NITRITE NEGATIVE 07/30/2020 0214   LEUKOCYTESUR MODERATE (A) 07/30/2020 0214    Sepsis Labs: Lactic Acid, Venous    Component Value Date/Time   LATICACIDVEN 2.1 (HH) 07/31/2020 0924    MICROBIOLOGY: Recent Results (from the past 240 hour(s))  Urine culture     Status: Abnormal   Collection Time: 07/30/20 12:59 AM   Specimen: In/Out Cath Urine  Result Value Ref Range Status   Specimen Description IN/OUT CATH URINE  Final   Special Requests NONE  Final   Culture (A)  Final    <10,000 COLONIES/mL INSIGNIFICANT GROWTH Performed at [facility], [address], [place], [place] [address]    Report Status 07/31/2020 FINAL  Final  Blood Culture (routine x 2)     Status: Abnormal   Collection Time: 07/30/20  1:27 AM   Specimen: BLOOD LEFT WRIST  Result Value Ref Range Status   Specimen Description BLOOD LEFT  WRIST  Final   Special Requests   Final    BOTTLES DRAWN AEROBIC AND ANAEROBIC Blood Culture adequate volume   Culture  Setup Time   Final    GRAM POSITIVE COCCI IN CLUSTERS AEROBIC BOTTLE ONLY CRITICAL RESULT CALLED TO, READ BACK BY AND VERIFIED WITH: A WOLFE PHARMD 1417 08/01/20 A BROWNING    Culture (A)  Final    STAPHYLOCOCCUS CAPITIS THE SIGNIFICANCE OF ISOLATING THIS ORGANISM FROM A SINGLE SET OF BLOOD CULTURES WHEN MULTIPLE SETS ARE DRAWN IS UNCERTAIN. PLEASE NOTIFY THE MICROBIOLOGY DEPARTMENT WITHIN ONE WEEK IF SPECIATION AND SENSITIVITIES ARE REQUIRED. Performed at [facility], [address], [place], [place] [address]    Report Status 08/03/2020 FINAL  Final  Blood Culture ID Panel (Reflexed)     Status: Abnormal   Collection Time: 07/30/20  1:27 AM  Result Value Ref Range Status   Enterococcus faecalis NOT DETECTED NOT DETECTED Final   Enterococcus Faecium NOT DETECTED NOT DETECTED Final   Listeria monocytogenes NOT DETECTED NOT DETECTED Final   Staphylococcus species DETECTED (A) NOT DETECTED Final    Comment: CRITICAL RESULT CALLED TO, READ BACK BY AND VERIFIED WITH: A WOLFE PHARMD 1417 08/01/20 A BROWNING    Staphylococcus aureus (BCID) NOT DETECTED NOT DETECTED Final   Staphylococcus epidermidis NOT DETECTED NOT DETECTED Final  Staphylococcus lugdunensis NOT DETECTED NOT DETECTED Final   Streptococcus species NOT DETECTED NOT DETECTED Final   Streptococcus agalactiae NOT DETECTED NOT DETECTED Final   Streptococcus pneumoniae NOT DETECTED NOT DETECTED Final   Streptococcus pyogenes NOT DETECTED NOT DETECTED Final   A.calcoaceticus-baumannii NOT DETECTED NOT DETECTED Final   Bacteroides fragilis NOT DETECTED NOT DETECTED Final   Enterobacterales NOT DETECTED NOT DETECTED Final   Enterobacter cloacae complex NOT DETECTED NOT DETECTED Final   Escherichia coli NOT DETECTED NOT DETECTED Final   Klebsiella aerogenes NOT DETECTED NOT DETECTED Final   Klebsiella  oxytoca NOT DETECTED NOT DETECTED Final   Klebsiella pneumoniae NOT DETECTED NOT DETECTED Final   Proteus species NOT DETECTED NOT DETECTED Final   Salmonella species NOT DETECTED NOT DETECTED Final   Serratia marcescens NOT DETECTED NOT DETECTED Final   Haemophilus influenzae NOT DETECTED NOT DETECTED Final   Neisseria meningitidis NOT DETECTED NOT DETECTED Final   Pseudomonas aeruginosa NOT DETECTED NOT DETECTED Final   Stenotrophomonas maltophilia NOT DETECTED NOT DETECTED Final   Candida albicans NOT DETECTED NOT DETECTED Final   Candida auris NOT DETECTED NOT DETECTED Final   Candida glabrata NOT DETECTED NOT DETECTED Final   Candida krusei NOT DETECTED NOT DETECTED Final   Candida parapsilosis NOT DETECTED NOT DETECTED Final   Candida tropicalis NOT DETECTED NOT DETECTED Final   Cryptococcus neoformans/gattii NOT DETECTED NOT DETECTED Final    Comment: Performed at [facility], [address] [place], [place] [address]  Resp Panel by RT-PCR (Flu A&B, Covid) Nasopharyngeal Swab     Status: None   Collection Time: 07/30/20  1:45 AM   Specimen: Nasopharyngeal Swab; Nasopharyngeal(NP) swabs in vial transport medium  Result Value Ref Range Status   SARS Coronavirus 2 by RT PCR NEGATIVE NEGATIVE Final    Comment: (NOTE) SARS-CoV-2 target nucleic acids are NOT DETECTED.  The SARS-CoV-2 RNA is generally detectable in upper respiratory specimens during the acute phase of infection. The lowest concentration of SARS-CoV-2 viral copies this assay can detect is 138 copies/mL. A negative result does not preclude SARS-Cov-2 infection and should not be used as the sole basis for treatment or other patient management decisions. A negative result may occur with  improper specimen collection/handling, submission of specimen other than nasopharyngeal swab, presence of viral mutation(s) within the areas targeted by this assay, and inadequate number of viral copies(<138 copies/mL). A  negative result must be combined with clinical observations, patient history, and epidemiological information. The expected result is Negative.  Fact Sheet for Patients:  BloggerCourse.com  Fact Sheet for Healthcare Providers:  SeriousBroker.it  This test is no t yet approved or cleared by the [place] FDA and  has been authorized for detection and/or diagnosis of SARS-CoV-2 by FDA under an Emergency Use Authorization (EUA). This EUA will remain  in effect (meaning this test can be used) for the duration of the COVID-19 declaration under Section 564(b)(1) of the Act, 21 U.S.C.section 360bbb-3(b)(1), unless the authorization is terminated  or revoked sooner.       Influenza A by PCR NEGATIVE NEGATIVE Final   Influenza B by PCR NEGATIVE NEGATIVE Final    Comment: (NOTE) The Xpert Xpress SARS-CoV-2/FLU/RSV plus assay is intended as an aid in the diagnosis of influenza from Nasopharyngeal swab specimens and should not be used as a sole basis for treatment. Nasal washings and aspirates are unacceptable for Xpert Xpress SARS-CoV-2/FLU/RSV testing.  Fact Sheet for Patients: BloggerCourse.com  Fact Sheet for Healthcare Providers:  SeriousBroker.it  This test is not yet approved or cleared by the [place] and has been authorized for detection and/or diagnosis of SARS-CoV-2 by FDA under an Emergency Use Authorization (EUA). This EUA will remain in effect (meaning this test can be used) for the duration of the COVID-19 declaration under Section 564(b)(1) of the Act, 21 U.S.C. section 360bbb-3(b)(1), unless the authorization is terminated or revoked.  Performed at [facility], [address], [place], [place] [address]   Blood Culture (routine x 2)     Status: None   Collection Time: 07/30/20  1:53 AM   Specimen: BLOOD  Result Value Ref Range Status   Specimen  Description BLOOD SITE NOT SPECIFIED  Final   Special Requests   Final    BOTTLES DRAWN AEROBIC AND ANAEROBIC Blood Culture adequate volume   Culture   Final    NO GROWTH 6 DAYS Performed at [facility], [address] [place], [place] [address]    Report Status 08/05/2020 FINAL  Final  MRSA PCR Screening     Status: None   Collection Time: 07/30/20  3:31 PM   Specimen: Nasopharyngeal  Result Value Ref Range Status   MRSA by PCR NEGATIVE NEGATIVE Final    Comment:        The GeneXpert MRSA Assay (FDA approved for NASAL specimens only), is one component of a comprehensive MRSA colonization surveillance program. It is not intended to diagnose MRSA infection nor to guide or monitor treatment for MRSA infections. Performed at [facility], [address], [place], [place] [address]     RADIOLOGY STUDIES/RESULTS: No results found.   LOS: 6 days   Jeoffrey Massed, MD  [facility]    To contact the attending provider between 7A-7P or the covering provider during after hours 7P-7A, please log into the web site [email or web address] and access using universal [place] password for that web site. If you do not have the password, please call the hospital operator.  08/05/2020, 1:39 PM

## 2020-08-05 NOTE — Progress Notes (Signed)
Occupational Therapy Treatment Patient Details Name: Kristy Jensen MRN: [id number] DOB: 08/22/1950 Today's Date: 08/05/2020    History of present illness 70 y.o. female with history of CVA, PAF, HLD, HTN presenting to the hospital 07/30/20 with altered mental status, A. fib with RVR, hyperkalemia and septic shock. 3/30  MRI brain: Increased size of acute to subacute infarcts of the right basal ganglia.   OT comments  Patient continues to make minimal to no progress towards goals in skilled OT session. Patient's session encompassed bed mobility, attempts to engage in ADL/IADL tasks, and further assessment of cognition. Pt seen in conjunction with PT for pt safety, decreased participation and complexity, please refer to PT for mobility purposes. Pt continues to demonstrate minimal participation and verbalizations in session, now requring total A to attempt self feeding and basic grooming tasks. Pt with delayed verbalizations throughout session, with the exception of attempting to sit up and pt promptly stanting "nope" when therapists initiated. Pt placed in chair position at end of session, with all needs met; therapy will continue to follow in house.    Follow Up Recommendations  SNF    Equipment Recommendations  Wheelchair (measurements OT);Wheelchair cushion (measurements OT);Hospital bed;Other (comment) (hoyer)    Recommendations for Other Services      Precautions / Restrictions Precautions Precautions: Fall Precaution Comments: cortrak, flexseal, purewick Restrictions Weight Bearing Restrictions: No       Mobility Bed Mobility Overal bed mobility: Needs Assistance Bed Mobility: Supine to Sit;Sit to Supine     Supine to sit: +2 for physical assistance;Total assist Sit to supine: +2 for physical assistance;Total assist   General bed mobility comments: pt exiting the bed on R side with HOB elevated to help with pivot on pad helicopter sitting. total (A) for bil LE. pt static  sitting minguard to mod A when pushing posteriorly. pt with flexed posture and posterior pelvic tilt. Pt total +2 total to return to supine with total (A) for bil LE. Pt dependent on staff for all mobility. Pt placed in chair position at end of session with pillow placed at R side due to preference    Transfers                 General transfer comment: NT due to poor static sitting with total +2 (A). Recommendation hoyer lift only at this time. Unknown prior to admission transfers    Balance Overall balance assessment: Needs assistance Sitting-balance support: No upper extremity supported;Feet supported Sitting balance-Leahy Scale: Poor Sitting balance - Comments: posterior bias, leans on R elbow/shoulder as she fatigues, able to achieve min guard for 5-10 minutes       Standing balance comment: deferred                           ADL either performed or assessed with clinical judgement   ADL Overall ADL's : Needs assistance/impaired   Eating/Feeding Details (indicate cue type and reason): attempting to place spoon in hand to eat, did not participate Grooming: Total assistance;Wash/dry face;Wash/dry hands Grooming Details (indicate cue type and reason): no assist to wash face in session, eyes opening, but minimally responsive when when stimuli applied                             Functional mobility during ADLs: Total assistance;+2 for physical assistance;+2 for safety/equipment General ADL Comments: total A to engage in ADL/IADLs and bed mobility,  minimal verbalizations, decreased participation     Vision       Perception     Praxis      Cognition Arousal/Alertness: Lethargic Behavior During Therapy: Flat affect Overall Cognitive Status: Impaired/Different from baseline Area of Impairment: Attention;Memory;Following commands;Safety/judgement;Awareness;Orientation;Problem solving                 Orientation Level: Time;Situation Current  Attention Level: Focused Memory: Decreased short-term memory Following Commands: Follows one step commands inconsistently;Follows one step commands with increased time Safety/Judgement: Decreased awareness of safety;Decreased awareness of deficits Awareness: Intellectual Problem Solving: Slow processing;Difficulty sequencing;Requires verbal cues;Requires tactile cues;Decreased initiation General Comments: Pt with minimal verbalizations in session, responding appropriately to first name, but delayed responses with all questions posed. Pt continued to close eyes in session, but would not voice fatigue or frustrations. Pt stating "both (in response to what hand she eats with) nope (when sitting up at EOB) I need something cold and Its hot" in session but all with delayed response with exception to "nope" when advancing toward EOB        Exercises     Shoulder Instructions       General Comments      Pertinent Vitals/ Pain       Pain Assessment: Faces Faces Pain Scale: Hurts a little bit Pain Location: making faces with movement, could not verbalize Pain Descriptors / Indicators: Grimacing Pain Intervention(s): Limited activity within patient's tolerance;Monitored during session;Repositioned  Home Living                                          Prior Functioning/Environment              Frequency  Min 2X/week        Progress Toward Goals  OT Goals(current goals can now be found in the care plan section)  Progress towards OT goals: Progressing toward goals  Acute Rehab OT Goals Patient Stated Goal: none stated OT Goal Formulation: Patient unable to participate in goal setting Time For Goal Achievement: 08/16/20 Potential to Achieve Goals: [place] Discharge plan remains appropriate    Co-evaluation    PT/OT/SLP Co-Evaluation/Treatment: Yes Reason for Co-Treatment: Complexity of the patient's impairments (multi-system involvement);Necessary to  address cognition/behavior during functional activity;For patient/therapist safety;To address functional/ADL transfers PT goals addressed during session: Mobility/safety with mobility;Balance OT goals addressed during session: ADL's and self-care;Strengthening/ROM      AM-PAC OT "6 Clicks" Daily Activity     Outcome Measure   Help from another person eating meals?: Total Help from another person taking care of personal grooming?: Total Help from another person toileting, which includes using toliet, bedpan, or urinal?: Total Help from another person bathing (including washing, rinsing, drying)?: Total Help from another person to put on and taking off regular upper body clothing?: Total Help from another person to put on and taking off regular lower body clothing?: Total 6 Click Score: 6    End of Session    OT Visit Diagnosis: Unsteadiness on feet (R26.81);Muscle weakness (generalized) (M62.81);Pain   Activity Tolerance Patient limited by lethargy;Patient limited by fatigue   Patient Left in bed;with call bell/phone within reach;with bed alarm set;with nursing/sitter in room   Nurse Communication Mobility status;Precautions;Need for lift equipment        Time: 9767-3419 OT Time Calculation (min): 26 min  Charges: OT General Charges $OT Visit: 1 Visit OT  Treatments $Self Care/Home Management : 8-22 mins  Corinne Ports E. Mackenzye Mackel, COTA/L Acute Rehabilitation Services [phone] [place] 08/05/2020, 3:47 PM

## 2020-08-05 NOTE — Progress Notes (Signed)
Physical Therapy Treatment Patient Details Name: Kristy Jensen MRN: [id number] DOB: 07/11/1950 Today's Date: 08/05/2020    History of Present Illness 70 y.o. female with history of CVA, PAF, HLD, HTN presenting to the hospital 07/30/20 with altered mental status, A. fib with RVR, hyperkalemia and septic shock. 3/30  MRI brain: Increased size of acute to subacute infarcts of the right basal ganglia.    PT Comments    Patient easily opens eyes to name, however quickly closes eyes and turns head to her right. Initiated turn to sit at EOB and attempt to engage pt in eating her lunch with pt vocalizing "nope." She had several other verbalizations during session ("hot" "need something cold"). She refused all items on her lunch tray, however did sit EOB and held herself propped on her rt forearm for ~8 minutes. To/from sitting required +2 total assist. Will await family decision re: goals of care planned with Palliative Care 4/6).      Follow Up Recommendations  SNF;Supervision/Assistance - 24 hour (Noted planned Palliative Care meeting with family 4/6)     Equipment Recommendations  Wheelchair (measurements PT);Wheelchair cushion (measurements PT);Hospital bed (lift; medical transport)    Recommendations for Other Services       Precautions / Restrictions Precautions Precautions: Fall Precaution Comments: cortrak, flexseal, purewick Restrictions Weight Bearing Restrictions: No    Mobility  Bed Mobility Overal bed mobility: Needs Assistance Bed Mobility: Supine to Sit;Sit to Supine Rolling: +2 for physical assistance;Total assist   Supine to sit: +2 for physical assistance;Total assist Sit to supine: +2 for physical assistance;Total assist   General bed mobility comments: pt exiting the bed on R side with HOB elevated to help with pivot on pad helicopter sitting. total (A) for bil LE. pt static sitting minguard to mod A when pushing posteriorly. pt with flexed posture and posterior  pelvic tilt. Pt total +2 total to return to supine with total (A) for bil LE. Pt dependent on staff for all mobility. Pt placed in chair position at end of session with pillow placed at R side due to preference    Transfers                 General transfer comment: NT due to poor static sitting with total +2 (A). Recommendation hoyer lift only at this time. Unknown prior to admission transfers  Ambulation/Gait                 Stairs             Wheelchair Mobility    Modified Rankin (Stroke Patients Only)       Balance Overall balance assessment: Needs assistance Sitting-balance support: No upper extremity supported;Feet supported Sitting balance-Leahy Scale: Poor Sitting balance - Comments: posterior bias, leans on R elbow/shoulder as she fatigues, able to achieve min guard for 5-10 minutes       Standing balance comment: deferred                            Cognition Arousal/Alertness: Lethargic Behavior During Therapy: Flat affect Overall Cognitive Status: Impaired/Different from baseline Area of Impairment: Attention;Following commands;Safety/judgement;Awareness;Orientation;Problem solving                 Orientation Level: Time;Situation Current Attention Level: Focused Memory: Decreased short-term memory Following Commands: Follows one step commands inconsistently;Follows one step commands with increased time Safety/Judgement: Decreased awareness of safety;Decreased awareness of deficits Awareness: Intellectual Problem Solving: Slow processing;Difficulty sequencing;Requires  verbal cues;Requires tactile cues;Decreased initiation General Comments: Pt with minimal verbalizations in session, responding appropriately to first name, but delayed responses with all questions posed. Pt continued to close eyes in session, but would not voice fatigue or frustrations. Pt stating "both (in response to what hand she eats with) nope (when sitting  up at EOB) I need something cold and Its hot" in session but all with delayed response with exception to "nope" when advancing toward EOB      Exercises      General Comments        Pertinent Vitals/Pain Pain Assessment: Faces Faces Pain Scale: Hurts a little bit Pain Location: making faces with movement, could not verbalize Pain Descriptors / Indicators: Grimacing Pain Intervention(s): Limited activity within patient's tolerance;Monitored during session    Home Living                      Prior Function            PT Goals (current goals can now be found in the care plan section) Acute Rehab PT Goals Patient Stated Goal: none stated Time For Goal Achievement: 08/16/20 Potential to Achieve Goals: Fair Progress towards PT goals: Not progressing toward goals - comment    Frequency    Min 2X/week      PT Plan Current plan remains appropriate    Co-evaluation PT/OT/SLP Co-Evaluation/Treatment: Yes Reason for Co-Treatment: Complexity of the patient's impairments (multi-system involvement);For patient/therapist safety;To address functional/ADL transfers PT goals addressed during session: Mobility/safety with mobility;Balance OT goals addressed during session: ADL's and self-care;Strengthening/ROM      AM-PAC PT "6 Clicks" Mobility   Outcome Measure  Help needed turning from your back to your side while in a flat bed without using bedrails?: Total Help needed moving from lying on your back to sitting on the side of a flat bed without using bedrails?: Total Help needed moving to and from a bed to a chair (including a wheelchair)?: Total Help needed standing up from a chair using your arms (e.g., wheelchair or bedside chair)?: Total Help needed to walk in hospital room?: Total Help needed climbing 3-5 steps with a railing? : Total 6 Click Score: 6    End of Session   Activity Tolerance: Patient limited by lethargy;Patient limited by fatigue Patient left:  in bed;with call bell/phone within reach;with bed alarm set Nurse Communication: Mobility status PT Visit Diagnosis: Muscle weakness (generalized) (M62.81);Other symptoms and signs involving the nervous system (R29.898)     Time: 3662-9476 PT Time Calculation (min) (ACUTE ONLY): 27 min  Charges:  $Therapeutic Activity: 8-22 mins                      Kristy Jensen, PT Pager [phone]    Zena Amos 08/05/2020, 4:26 PM

## 2020-08-05 NOTE — Plan of Care (Signed)

## 2020-08-05 NOTE — Progress Notes (Signed)
  Speech Language Pathology Treatment: Dysphagia  Patient Details Name: Kristy Jensen MRN: [id number] DOB: 08-09-1950 Today's Date: 08/05/2020 Time: 3267-1245 SLP Time Calculation (min) (ACUTE ONLY): 11 min  Assessment / Plan / Recommendation Clinical Impression  Pt was sleeping upon arrival but awakened well for PO intake given environmental stimulation and multimodal cueing. She ate little off her breakfast tray, needing Mod cues for bolus awareness, particularly during transitions from liquids and solids. She had prolonged bolus formation with chopped foods that was more timely with purees and thin liquids. No overt s/s of aspiration noted. NT describes good intake on previous date when pt was also more alert and engaged in self-feeding. Recommend continuing current diet for now.    HPI HPI: Pt is a 70 year old female with a history of CVA, urinary tract infections, atrial fibrillation ventricular response and SVT.  She was discharged in February 2022 following urinary tract infection and altered mental status. She presented to the ED on 3/30 with altered mental status  and ventricular heart rate with atrial fibrillation. 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 MRI brain completed due to encephalopathy on 3/30: Increased size of acute to subacute infarcts of the right basal ganglia. CXR 3/30: Stable bibasilar pulmonary fibrotic changes. Cortrak placed 4/1      SLP Plan  Continue with current plan of care       Recommendations  Diet recommendations: Dysphagia 2 (fine chop);Thin liquid Liquids provided via: Cup;Straw Medication Administration: Whole meds with puree Supervision: Patient able to self feed;Staff to assist with self feeding;Full supervision/cueing for compensatory strategies Compensations: Minimize environmental distractions;Slow rate;Small sips/bites;Other (Comment) (check for oral residue) Postural Changes and/or Swallow Maneuvers:  Seated upright 90 degrees                Oral Care Recommendations: Oral care BID Follow up Recommendations: Skilled Nursing facility SLP Visit Diagnosis: Dysphagia, unspecified (R13.10) Plan: Continue with current plan of care       GO                Mahala Menghini., M.A. CCC-SLP Acute Rehabilitation Services Pager [phone] Office [phone]  08/05/2020, 11:43 AM

## 2020-08-05 NOTE — Significant Event (Signed)
Rapid Response Event Note   Reason for Call :  Lethargy  Initial Focused Assessment:  Pt lying in bed with eyes closed, in no distress. Pt awakens to painful stimulation, will follow commands and move all extremities. She falls back asleep easily with no stimulation. Pupils 4 and sluggish. Skin hot to touch.   T-99.7, HR-75, BP-127/99, RR-21, SpO2-98% on RA.  Per MD notes, pt was sleepy on rounds this AM.   Interventions:  CBG-217 Tylenol  Plan of Care:  Given tylenol. If mental status does not improve after treating temp, ABG/labs may be helpful. Continue to monitor pt closely. Call RRT if further assistance needed.    Event Summary:   MD Notified:  Call [phone] Arrival [phone] End Time:2250  Terrilyn Saver, RN

## 2020-08-05 NOTE — Progress Notes (Signed)
Nutrition Follow-up  DOCUMENTATION CODES:  Non-severe (moderate) malnutrition in context of chronic illness  INTERVENTION:  Change to nocturnal TF: Over 14 hrs (6p-8a) via Cortrak: Osmolite 1.5 at 60 ml/h (840 ml per day) Prosource TF 45 ml TID  Provides 1380 kcal, 85 gm protein, 638 ml free water daily. (75% of kcal needs and 100% of protein needs)  Continue PO diet per SLP recommendations.  NUTRITION DIAGNOSIS:  Moderate Malnutrition related to chronic illness as evidenced by mild fat depletion,moderate muscle depletion,percent weight loss (15.9% weight loss since 06/17/20). - ongoing  GOAL:  Patient will meet greater than or equal to 90% of their needs - met with TF  MONITOR:  Diet advancement,Labs,Weight trends,TF tolerance,Skin,I & O's  REASON FOR ASSESSMENT:  Consult Enteral/tube feeding initiation and management  ASSESSMENT:  70 year old female who presented on 3/30 with AMS. PMH of CVA, HTN, atrial fibrillation. Pt with sepsis secondary to UTI, AKI. 3/30 - MRI brain: Increased size of acute to subacute infarcts of the right basal ganglia; admitted to ICU 4/2 - transferred to [facility] 4/4 - SLP rec'd Dys 2 with thins; ate 25% of breakfast 4/5 - considering removal of Cortrak if PO is adequate  Current TF regimen: Osmolite 1.2 @ 65 ml/hr (1560 ml/day) via Cortrak (tip gastric) - Tube feeding provides 1872 kcal, 87 grams of protein, and 1279 ml of H2O  Spoke with RN. Pt tired this morning. RN to try to feed pt at 11 am to see if she will eat today. RN turned off TF at 10 am to stimulate appetite.  Secure-chatted MD. MD spoke to family extensively and reported that she has not been eating well for several months PTA. MD recommends doing noctural feeds. Palliative care meeting scheduled for tomorrow, may be going home with hospice.  Recommend nocturnal TF per MD recommendation. See interventions for prescription.  Relevant Medications: SSI Labs: reviewed; CBG 159-194, Phos  2.4 HbA1c: 6.0% (06/2020)  Diet Order:   Diet Order            DIET DYS 2 Room service appropriate? Yes; Fluid consistency: Thin  Diet effective now                EDUCATION NEEDS:  No education needs have been identified at this time  Skin:  Skin Assessment: Skin Integrity Issues: Skin Integrity Issues:: Stage II Stage II: sacrum, left buttocks  Last BM:  08/03/20 - Type 6,7  Rectal Pouch (placed 4/1): 100 ml/24 hrs 2 unmeasured stool occurences   Height:  Ht Readings from Last 1 Encounters:  07/30/20 _0  (1.753 m)   Weight:  Wt Readings from Last 1 Encounters:  08/05/20 100.2 kg   Ideal Body Weight:  66 kg  BMI:  Body mass index is 32.62 kg/m.  Estimated Nutritional Needs:  Kcal:  1800-2000 Protein:  85-100 grams Fluid:  1.8-2.0 L  Derrel Nip, RD, LDN Registered Dietitian After Hours/Weekend Pager # in [place]

## 2020-08-06 DIAGNOSIS — I6300 Cerebral infarction due to thrombosis of unspecified precerebral artery: Secondary | ICD-10-CM | POA: Diagnosis not present

## 2020-08-06 DIAGNOSIS — Z66 Do not resuscitate: Secondary | ICD-10-CM

## 2020-08-06 DIAGNOSIS — A021 Salmonella sepsis: Secondary | ICD-10-CM | POA: Diagnosis not present

## 2020-08-06 DIAGNOSIS — N390 Urinary tract infection, site not specified: Secondary | ICD-10-CM | POA: Diagnosis not present

## 2020-08-06 DIAGNOSIS — G6281 Critical illness polyneuropathy: Secondary | ICD-10-CM | POA: Diagnosis not present

## 2020-08-06 DIAGNOSIS — I4891 Unspecified atrial fibrillation: Secondary | ICD-10-CM | POA: Diagnosis not present

## 2020-08-06 DIAGNOSIS — Z789 Other specified health status: Secondary | ICD-10-CM

## 2020-08-06 DIAGNOSIS — R6520 Severe sepsis without septic shock: Secondary | ICD-10-CM | POA: Diagnosis not present

## 2020-08-06 DIAGNOSIS — N179 Acute kidney failure, unspecified: Secondary | ICD-10-CM | POA: Diagnosis not present

## 2020-08-06 DIAGNOSIS — Z515 Encounter for palliative care: Secondary | ICD-10-CM

## 2020-08-06 LAB — GLUCOSE, CAPILLARY
Glucose-Capillary: 121 mg/dL — ABNORMAL HIGH (ref 70–99)
Glucose-Capillary: 127 mg/dL — ABNORMAL HIGH (ref 70–99)
Glucose-Capillary: 135 mg/dL — ABNORMAL HIGH (ref 70–99)
Glucose-Capillary: 136 mg/dL — ABNORMAL HIGH (ref 70–99)
Glucose-Capillary: 146 mg/dL — ABNORMAL HIGH (ref 70–99)

## 2020-08-06 LAB — CBC
HCT: 31.9 % — ABNORMAL LOW (ref 36.0–46.0)
Hemoglobin: 9.9 g/dL — ABNORMAL LOW (ref 12.0–15.0)
MCH: 28.4 pg (ref 26.0–34.0)
MCHC: 31 g/dL (ref 30.0–36.0)
MCV: 91.4 fL (ref 80.0–100.0)
Platelets: 182 10*3/uL (ref 150–400)
RBC: 3.49 MIL/uL — ABNORMAL LOW (ref 3.87–5.11)
RDW: 16 % — ABNORMAL HIGH (ref 11.5–15.5)
WBC: 6.2 10*3/uL (ref 4.0–10.5)
nRBC: 0 % (ref 0.0–0.2)

## 2020-08-06 LAB — PHOSPHORUS: Phosphorus: 2.6 mg/dL (ref 2.5–4.6)

## 2020-08-06 MED ORDER — ONDANSETRON 4 MG PO TBDP: 4.0000 mg | ORAL_TABLET | Freq: Four times a day (QID) | ORAL | Status: DC | PRN

## 2020-08-06 MED ORDER — LORAZEPAM 2 MG/ML IJ SOLN: 1.0000 mg | INTRAMUSCULAR | Status: DC | PRN

## 2020-08-06 MED ORDER — HALOPERIDOL LACTATE 5 MG/ML IJ SOLN: 2.0000 mg | Freq: Four times a day (QID) | INTRAMUSCULAR | Status: DC | PRN

## 2020-08-06 MED ORDER — LORAZEPAM 1 MG PO TABS: 1.0000 mg | ORAL_TABLET | ORAL | Status: DC | PRN

## 2020-08-06 MED ORDER — LORAZEPAM 2 MG/ML PO CONC: 1.0000 mg | ORAL | Status: DC | PRN

## 2020-08-06 MED ORDER — GLYCOPYRROLATE 0.2 MG/ML IJ SOLN: 0.2000 mg | INTRAMUSCULAR | Status: DC | PRN

## 2020-08-06 MED ORDER — GLYCOPYRROLATE 1 MG PO TABS
1.0000 mg | ORAL_TABLET | ORAL | Status: DC | PRN
  Filled 2020-08-06: qty 1

## 2020-08-06 MED ORDER — BIOTENE DRY MOUTH MT LIQD
15.0000 mL | Freq: Two times a day (BID) | OROMUCOSAL | Status: DC
  Administered 2020-08-06 – 2020-08-12 (×12): 15 mL via TOPICAL

## 2020-08-06 MED ORDER — MORPHINE SULFATE (PF) 2 MG/ML IV SOLN: 1.0000 mg | INTRAVENOUS | Status: DC | PRN

## 2020-08-06 MED ORDER — HALOPERIDOL LACTATE 2 MG/ML PO CONC
2.0000 mg | Freq: Four times a day (QID) | ORAL | Status: DC | PRN
  Filled 2020-08-06 (×2): qty 1

## 2020-08-06 MED ORDER — HALOPERIDOL 1 MG PO TABS
2.0000 mg | ORAL_TABLET | Freq: Four times a day (QID) | ORAL | Status: DC | PRN
  Filled 2020-08-06: qty 2

## 2020-08-06 MED ORDER — POLYVINYL ALCOHOL 1.4 % OP SOLN
1.0000 [drp] | Freq: Four times a day (QID) | OPHTHALMIC | Status: DC | PRN
  Filled 2020-08-06: qty 15

## 2020-08-06 MED ORDER — ONDANSETRON HCL 4 MG/2ML IJ SOLN: 4.0000 mg | Freq: Four times a day (QID) | INTRAMUSCULAR | Status: DC | PRN

## 2020-08-06 NOTE — Plan of Care (Signed)

## 2020-08-06 NOTE — Progress Notes (Signed)
Daily Progress Note   Patient Name: Baron Hamper       Date: 08/06/2020 DOB: [birth year]  Age: 70 y.o. MRN#: [id number] Attending Physician: Jonetta Osgood, MD Primary Care Physician: Patient, No Pcp Per (Inactive) Admit Date: 07/30/2020  Reason for Consultation/Follow-up: Establishing goals of care  Subjective: Chart review performed. Received report from primary RN - no acute concerns. RN reports patient remains lethargic, not interactive, very minimal PO intake (0-5% of meals), confused and oriented to self only, total care.  4:00 PM Went to visit patient at bedside - son/[name] was present. Patient was lying in bed awake and alert - she is not able to make complex medical decisions. She does respond to simple questions. When asked how she was feeling, she responds "good." No signs or non-verbal gestures of pain or discomfort noted. No respiratory distress, increased work of breathing, or secretions noted. Patient denies pain.   Son/[name] and I met in 2W conference room. [name] conference called patient's daughter/[name], daughter/Megan, DIL/Laura, brother/Jim to be present for [place] discussion today. Allowed space for family to review all information given to them thus far. Family understand patient is approaching EOL.  Natural disease trajectory and expectations at EOL were discussed. I attempted to elicit values and goals of care important to the patient. The difference between aggressive medical intervention and comfort care was considered in light of the patient's goals of care. Patient's advanced directive documents were discussed. Family had reviewed these prior to meeting and were aware of Ms. Cuda's wishes to not have aggressive life prolonging measures. Allowed space and opportunity for  family to discuss among themselves thoughts and feelings. Family are agreeable that patient has not been the same since her stroke in 2019. They feel, especially after this hospitalization, she has had "major changes and regression in personality" and wouldn't enjoy "living life in bed." We talked about transition to comfort measures in house and what that would entail inclusive of medications to control pain, dyspnea, agitation, nausea, itching, and hiccups. We discussed stopping all unnecessary measures such as blood draws, needle sticks, oxygen, antibiotics, CBGs/insulin, cardiac monitoring, artifical feeding and hydration, and frequent vital signs. Family are agreeable to initiate full comfort measures in house today. They are agreeable to remove coretrak.   Provided education and counseling at length on the philosophy and  benefits of hospice care. Discussed that it offers a holistic approach to care in the setting of end-stage illness and is about supporting the patient where they are allowing nature to take it's course. Discussed the hospice team includes RNs, physicians, social workers, and chaplains. They can provide personal care, support for the family, and help keep patient out of the hospital. Provided reassurance that residential hospice referral could be cancelled (would anticipate hospital death) at any time if patient's condition changed and it was felt they were too unstable for transfer. As of this time, discussed that patient seemed stable for transfer. Family are interested in [facility]   Current [place] [id number] visitation policy was reviewed - family express understanding.   Visit also consisted of discussions dealing with the complex and emotionally intense issues of symptom management and palliative care in the setting of serious illness. Palliative care team will continue to support patient, patient's family, and medical team.  Discussed with family the importance of continued  conversation with each other and the medical providers regarding overall plan of care and treatment options, ensuring decisions are within the context of the patient's values and GOCs.    Questions and concerns were addressed. The patient/family was encouraged to call with questions and/or concerns. PMT card/number was provided.   Length of Stay: 7  Current Medications: Scheduled Meds:  . chlorhexidine  15 mL Mouth Rinse BID  . Chlorhexidine Gluconate Cloth  6 each Topical Daily  . enoxaparin (LOVENOX) injection  100 mg Subcutaneous Q12H  . feeding supplement (OSMOLITE 1.5 CAL)  1,000 mL Per Tube Q24H  . feeding supplement (PROSource TF)  45 mL Per Tube TID  . free water  100 mL Per Tube Q8H  . insulin aspart  0-6 Units Subcutaneous Q4H  . mouth rinse  15 mL Mouth Rinse q12n4p  . metoprolol tartrate  5 mg Intravenous Q8H    Continuous Infusions: . sodium chloride 10 mL/hr at 08/02/20 0308  . sodium chloride      PRN Meds: sodium chloride, acetaminophen **OR** acetaminophen, docusate sodium, polyethylene glycol  Physical Exam Vitals and nursing note reviewed.  Constitutional:      General: She is not in acute distress.    Appearance: She is ill-appearing.  Pulmonary:     Effort: No respiratory distress.  Skin:    General: Skin is warm and dry.  Neurological:     Mental Status: She is alert. She is disoriented.     Motor: Weakness present.  Psychiatric:        Attention and Perception: Attention normal.        Behavior: Behavior is cooperative.        Cognition and Memory: Cognition is impaired. Memory is impaired.             Vital Signs: BP (!) 121/58 (BP Location: Left Arm)   Pulse 73   Temp 99.7 F (37.6 C) (Rectal)   Resp 12   Ht '5\' 9"'  (1.753 m)   Wt 100.1 kg   SpO2 97%   BMI 32.59 kg/m  SpO2: SpO2: 97 % O2 Device: O2 Device: Room Air O2 Flow Rate: O2 Flow Rate (L/min): 2 L/min  Intake/output summary:   Intake/Output Summary (Last 24 hours) at  08/06/2020 1536 Last data filed at 08/06/2020 1328 Gross per 24 hour  Intake 515 ml  Output 500 ml  Net 15 ml   LBM: Last BM Date: 08/03/20 Baseline Weight: Weight: 99.8 kg Most recent weight: Weight:  100.1 kg       Palliative Assessment/Data: PPS 20%      Patient Active Problem List   Diagnosis Date Noted  . Hypomagnesemia 08/03/2020  . SVT (supraventricular tachycardia) ([place]) 08/03/2020  . Malnutrition of moderate degree 08/01/2020  . Atrial fibrillation ([place])   . Sepsis ([place]) 07/30/2020  . ARF (acute renal failure) ([place]) 07/30/2020  . Shock ([place]) 07/30/2020  . Pressure injury of skin 07/30/2020  . Altered mental status 06/14/2020  . Metabolic encephalopathy 04/88/8916  . CVA (cerebral vascular accident) ([place]) 06/13/2020  . Acute lower UTI 06/13/2020  . Hyperglycemia 06/13/2020    Palliative Care Assessment & Plan   Patient Profile: 70 y.o. female  with past medical history of CVA, UTIs, Afib with SVT recently admitted and discharge February with UTI and AMS-  admitted on 07/30/2020 with alterend mental status, a fib and RVR. Additionally with acute kidney failure, hyperkalemia, hypernatremia. Admission complicated by sepsis from UTI and MRI on 3/30 indicating increased size in acute CVA. SLP eval indicates difficulty following commands, unable to demonstrate volitional swallow. Palliative medicine consulted [place].  Assessment: Septic shock secondary to complicated UTI Acute metabolic encephalopathy PAF with RVR-SVT Dysphasia Recent CVA  Recommendations/Plan: Initiated full comfort measures Continue DNR/DNI as previously documented Transfer to residential hospice, family requesting [place] consult placed Added orders for EOL symptom management and to reflect full comfort measures, as well as discontinued orders that were not focused on comfort Continue palliative wound care  Unrestricted visitation orders were placed per current [place] EOL  visitation policy  Nursing to provide frequent assessments and administer PRN medications as clinically necessary to ensure EOL comfort PMT will continue to follow and support holistically  Goals of Care and Additional Recommendations: Limitations on Scope of Treatment: Full Comfort Care  Code Status:    Code Status Orders  (From admission, onward)         Start     Ordered   08/02/20 1712  Do not attempt resuscitation (DNR)  Continuous       Question Answer Comment  In the event of cardiac or respiratory ARREST Do not call a "code blue"   In the event of cardiac or respiratory ARREST Do not perform Intubation, CPR, defibrillation or ACLS   In the event of cardiac or respiratory ARREST Use medication by any route, position, wound care, and other measures to relive pain and suffering. May use oxygen, suction and manual treatment of airway obstruction as needed for comfort.      08/02/20 1711        Code Status History    Date Active Date Inactive Code Status Order ID Comments User Context   07/30/2020 0841 08/02/2020 1711 Full Code [id number]  Minor, Grace Bushy, NP ED   07/30/2020 0408 07/30/2020 0840 Full Code [id number]  Rise Patience, MD ED   06/13/2020 2226 06/19/2020 2036 Full Code [id number]  Elwyn Reach, MD ED   06/13/2020 2020 06/13/2020 2226 Full Code [id number]  Elwyn Reach, MD ED   Advance Care Planning Activity      Prognosis:  < 2 weeks  Discharge Planning: Hospice facility  Care plan was discussed with primary LPN, patient's family, Dr. Sloan Leiter  Thank you for allowing the Palliative Medicine Team to assist in the care of this patient.   Total Time 85 minutes Prolonged Time Billed  yes       Greater than 50%  of this time was spent counseling  and coordinating care related to the above assessment and plan.  Lin Landsman, NP  Please contact Palliative Medicine Team phone at [phone] for questions and concerns.

## 2020-08-06 NOTE — Progress Notes (Signed)
PROGRESS NOTE        PATIENT DETAILS Name: Beulah Gandy Age: 70 y.o. Sex: female Date of Birth: [birth year] Admit Date: 07/30/2020 Admitting Physician Steffanie Dunn, DO BMW:[id number], No Pcp Per (Inactive)  Brief Narrative: Patient is a 70 y.o. female with history of CVA, PAF, HLD, HTN presenting to the hospital with altered mental status, A. fib with RVR, hyperkalemia and septic shock.  Significant events: 3/30>> admit to ICU 4/2>> transferred to [facility] 4/4>> dysphagia 2 diet started  Significant studies: 2/13>> Echo: EF 55-60%, grade 1 diastolic dysfunction. 3/30>> CT head: No acute intracranial abnormality. 3/30>> CT renal stone study: Bibasilar pulmonary infiltrates, no acute [place] abdominal pathology. 3/30>> chest x-ray: Stable bibasilar pulmonary fibrotic changes. 3/30>> MRI brain: Increased size of acute to subacute infarcts of the right basal ganglia.  Antimicrobial therapy: Aztreonam: 3/29>>4/3 Flagyl: 3/29>> 3/30 Vancomycin: 3/29 x 1  Microbiology data: 3/30>> urine culture:<10,000 colonies/mL 3/30>> 1/2 blood culture: Gram-positive cocci in clusters-likely contaminant  Procedures : None  Consults: CCM, neurology  DVT Prophylaxis : SCDs Start: 07/30/20 1027  Therapeutic Lovenox  Subjective: Awake and alert this morning-appears very lethargic.  Did not eat breakfast this morning at all per nursing staff.  Per nursing staff-yesterday ate only 5% of breakfast, 0% lunch, and 5% dinner.  Assessment/Plan: Septic shock due to complicated UTI: Sepsis physiology has improved-culture data as above-although urine cultures essentially negative-being treated as complicated UTI.  Has completed a course of aztreonam.  Acute metabolic encephalopathy: Felt to be due to AKI/sepsis-improving-minimally confused today-answering simple questions.  Continue supportive care.  Evaluated by neurology-although MRI brain showed increased size of the subacute  infarct-she is not felt to have acute CVA clinically.  PAF with RVR-SVT: Remains in sinus rhythm-on beta-blocker and therapeutic anticoagulation with Lovenox.  AKI: Likely hemodynamically mediated-resolved.  Hypokalemia/hypomagnesemia/hypophosphatemia: Repleted.  Hypernatremia: Resolved-on free water flushes via NG tube.  Normocytic anemia: Due to acute illness-no evidence of blood loss-monitor  Known right thyroid goiter with 1.7 cm nodule: TSH within normal limit-we will require further work-up including an FNA in the outpatient setting.  Recent CVA February 2022: On anticoagulation-encephalopathy not attributed to CVA.  Dysphagia: Probably chronic-worsened by encephalopathy/acute illness-NG tube in place- evaluated by SLP on 4/4-dysphagia 2 diet started-no aspiration noted following initiation of oral intake-however appetite remains very poor.  Per discussion with patient's daughter-patient had very poor appetite/intake for several weeks even prior to this hospital stay.  Continue nocturnal feedings-we will await further recommendations from palliative care.   Goals of care: Frail-recurrent hospitalizations-severe dysphagia-debilitated-DNR-palliative care meeting with family on 4/6.  Per my discussion with patient's son on 4/5-a significant decline in function over the past several months-very poor oral intake even prior to this hospitalization-hardly any oral intake over the past few days-suspect she is not eating enough to sustain life-and may be appropriate to transition to full comfort measures.  Palliative care meeting with family scheduled for today-we will await further recommendations.  Nutrition Problem: Nutrition Problem: Moderate Malnutrition Etiology: chronic illness Signs/Symptoms: mild fat depletion,moderate muscle depletion,percent weight loss (15.9% weight loss since 06/17/20) Percent weight loss: 15.9 % Interventions: Tube feeding  Obesity: Estimated body mass index is  32.59 kg/m as calculated from the following:   Height as of this encounter: 5\' 9"  (1.753 m).   Weight as of this encounter: 100.1 kg.     Diet: Diet  Order            DIET DYS 2 Room service appropriate? Yes; Fluid consistency: Thin  Diet effective now                  Code Status: Full code   Family Communication: 4/6>> palliative care meeting with family-we will defer calling family today. Daughter-[name]- on 4/5, son Char Feltman at bedside. Son-[name] Civello-[phone]-left a voicemail on 4/2.  Disposition Plan: Status is: Inpatient  Remains inpatient appropriate because:IV treatments appropriate due to intensity of illness or inability to take PO and Inpatient level of care appropriate due to severity of illness   Dispo: The patient is from: Home              Anticipated d/c is to: SNF versus residential hospice              Patient currently is not medically stable to d/c.   Difficult to place patient No    Barriers to Discharge: Goals of care meeting scheduled with family today-we will await recommendations from palliative care regarding appropriate disposition.  Antimicrobial agents: Anti-infectives (From admission, onward)   Start     Dose/Rate Route Frequency Ordered Stop   08/02/20 1800  aztreonam (AZACTAM) 1 g in sodium chloride 0.9 % 100 mL IVPB  Status:  Discontinued        1 g 200 mL/hr over 30 Minutes Intravenous Every 8 hours 08/02/20 0941 08/02/20 0947   08/02/20 1045  aztreonam (AZACTAM) 1 g in sodium chloride 0.9 % 100 mL IVPB        1 g 200 mL/hr over 30 Minutes Intravenous Every 8 hours 08/02/20 0947 08/03/20 1756   07/31/20 0800  vancomycin (VANCOREADY) IVPB 750 mg/150 mL  Status:  Discontinued        750 mg 150 mL/hr over 60 Minutes Intravenous Every 24 hours 07/31/20 0718 07/31/20 0816   07/30/20 1100  aztreonam (AZACTAM) 1 g in sodium chloride 0.9 % 100 mL IVPB  Status:  Discontinued        1 g 200 mL/hr over 30 Minutes  Intravenous Every 8 hours 07/30/20 0329 08/02/20 0941   07/30/20 1000  metroNIDAZOLE (FLAGYL) IVPB 500 mg  Status:  Discontinued        500 mg 100 mL/hr over 60 Minutes Intravenous Every 8 hours 07/30/20 0408 07/31/20 0816   07/30/20 0415  vancomycin (VANCOREADY) IVPB 1000 mg/200 mL  Status:  Discontinued        1,000 mg 200 mL/hr over 60 Minutes Intravenous  Once 07/30/20 0408 07/30/20 0410   07/30/20 0329  vancomycin variable dose per unstable renal function (pharmacist dosing)  Status:  Discontinued         Does not apply See admin instructions 07/30/20 0329 07/31/20 0757   07/30/20 0115  vancomycin (VANCOREADY) IVPB 2000 mg/400 mL        2,000 mg 200 mL/hr over 120 Minutes Intravenous  Once 07/30/20 0103 07/30/20 0534   07/30/20 0100  aztreonam (AZACTAM) 2 g in sodium chloride 0.9 % 100 mL IVPB        2 g 200 mL/hr over 30 Minutes Intravenous  Once 07/30/20 0059 07/30/20 0214   07/30/20 0100  metroNIDAZOLE (FLAGYL) IVPB 500 mg        500 mg 100 mL/hr over 60 Minutes Intravenous  Once 07/30/20 0059 07/30/20 0341   07/30/20 0100  vancomycin (VANCOREADY) IVPB 1000 mg/200 mL  Status:  Discontinued  1,000 mg 200 mL/hr over 60 Minutes Intravenous  Once 07/30/20 0059 07/30/20 0103       Time spent: 15- minutes-Greater than 50% of this time was spent in counseling, explanation of diagnosis, planning of further management, and coordination of care.  MEDICATIONS: Scheduled Meds: . chlorhexidine  15 mL Mouth Rinse BID  . Chlorhexidine Gluconate Cloth  6 each Topical Daily  . enoxaparin (LOVENOX) injection  100 mg Subcutaneous Q12H  . feeding supplement (OSMOLITE 1.5 CAL)  1,000 mL Per Tube Q24H  . feeding supplement (PROSource TF)  45 mL Per Tube TID  . free water  100 mL Per Tube Q8H  . insulin aspart  0-6 Units Subcutaneous Q4H  . mouth rinse  15 mL Mouth Rinse q12n4p  . metoprolol tartrate  5 mg Intravenous Q8H   Continuous Infusions: . sodium chloride 10 mL/hr at 08/02/20  0308  . sodium chloride     PRN Meds:.sodium chloride, acetaminophen **OR** acetaminophen, docusate sodium, polyethylene glycol   PHYSICAL EXAM: Vital signs: Vitals:   08/06/20 0020 08/06/20 0341 08/06/20 0643 08/06/20 0800  BP: (!) 104/42 134/61  (!) 111/49  Pulse: 78 78  66  Resp: Temp:      TempSrc:      SpO2: 96% 98%  99%  Weight:   100.1 kg   Height:       Filed Weights   08/04/20 0517 08/05/20 0603 08/06/20 0643  Weight: 100.2 kg 100.2 kg 100.1 kg   Body mass index is 32.59 kg/m.   Gen Exam: Mildly confused-not in any distress HEENT:atraumatic, normocephalic Chest: B/L clear to auscultation anteriorly CVS:S1S2 regular Abdomen:soft non tender, non distended Extremities:no edema Neurology: Non focal Skin: no rash  I have personally reviewed following labs and imaging studies  LABORATORY DATA: CBC: Recent Labs  Lab 08/02/20 0048 08/03/20 0011 08/04/20 0359 08/05/20 0633 08/06/20 0226  WBC 5.3 5.6 6.4 6.8 6.2  HGB 10.2* 11.1* 11.0* 10.6* 9.9*  HCT 32.6* 34.9* 36.0 34.1* 31.9*  MCV 90.1 87.9 90.0 90.5 91.4  PLT 145* 71* 143* 167 182    Basic Metabolic Panel: Recent Labs  Lab 07/31/20 1945 08/01/20 0501 08/02/20 0048 08/03/20 0011 08/04/20 0359 08/05/20 0633 08/06/20 0226  NA  --  148* 142 143  143 146* 141  --   K  --  3.6 3.3* 4.1  4.1 4.5 4.9  --   CL  --  115* 113* 114*  115* 115* 111  --   CO2  --  --   GLUCOSE  --  124* 161* 146*  151* 177* 186*  --   BUN  --  51* 30* --   CREATININE  --  1.11* 0.93 0.84  0.79 0.73 0.70  --   CALCIUM  --  8.0* 7.7* 8.2*  8.1* 8.5* 8.5*  --   MG 1.9 1.8 1.5* 1.6*  1.6* 2.3  --   --   PHOS 2.9 1.9* 2.2* 1.9* 1.6* 2.4* 2.6    GFR: Estimated Creatinine Clearance: 83.6 mL/min (by C-G formula based on SCr of 0.7 mg/dL).  Liver Function Tests: Recent Labs  Lab 08/03/20 0011  ALBUMIN 2.2*   Recent Labs  Lab 07/31/20 0924  LIPASE 69*   No  results for input(s): AMMONIA in the last 168 hours.  Coagulation Profile: No results for input(s): INR, PROTIME in the last 168 hours.  Cardiac  Enzymes: No results for input(s): CKTOTAL, CKMB, CKMBINDEX, TROPONINI in the last 168 hours.  BNP (last 3 results) No results for input(s): PROBNP in the last 8760 hours.  Lipid Profile: No results for input(s): CHOL, HDL, LDLCALC, TRIG, CHOLHDL, LDLDIRECT in the last 72 hours.  Thyroid Function Tests: No results for input(s): TSH, T4TOTAL, FREET4, T3FREE, THYROIDAB in the last 72 hours.  Anemia Panel: No results for input(s): VITAMINB12, FOLATE, FERRITIN, TIBC, IRON, RETICCTPCT in the last 72 hours.  Urine analysis:    Component Value Date/Time   COLORURINE AMBER (A) 07/30/2020 0214   APPEARANCEUR TURBID (A) 07/30/2020 0214   LABSPEC 1.017 07/30/2020 0214   PHURINE 5.0 07/30/2020 0214   GLUCOSEU 50 (A) 07/30/2020 0214   HGBUR MODERATE (A) 07/30/2020 0214   BILIRUBINUR NEGATIVE 07/30/2020 0214   KETONESUR 5 (A) 07/30/2020 0214   PROTEINUR 100 (A) 07/30/2020 0214   NITRITE NEGATIVE 07/30/2020 0214   LEUKOCYTESUR MODERATE (A) 07/30/2020 0214    Sepsis Labs: Lactic Acid, Venous    Component Value Date/Time   LATICACIDVEN 2.1 (HH) 07/31/2020 0924    MICROBIOLOGY: Recent Results (from the past 240 hour(s))  Urine culture     Status: Abnormal   Collection Time: 07/30/20 12:59 AM   Specimen: In/Out Cath Urine  Result Value Ref Range Status   Specimen Description IN/OUT CATH URINE  Final   Special Requests NONE  Final   Culture (A)  Final    <10,000 COLONIES/mL INSIGNIFICANT GROWTH Performed at [facility], [address], [place], [place] [address]    Report Status 07/31/2020 FINAL  Final  Blood Culture (routine x 2)     Status: Abnormal   Collection Time: 07/30/20  1:27 AM   Specimen: BLOOD LEFT WRIST  Result Value Ref Range Status   Specimen Description BLOOD LEFT WRIST  Final   Special Requests   Final     BOTTLES DRAWN AEROBIC AND ANAEROBIC Blood Culture adequate volume   Culture  Setup Time   Final    GRAM POSITIVE COCCI IN CLUSTERS AEROBIC BOTTLE ONLY CRITICAL RESULT CALLED TO, READ BACK BY AND VERIFIED WITH: A WOLFE PHARMD 1417 08/01/20 A BROWNING    Culture (A)  Final    STAPHYLOCOCCUS CAPITIS THE SIGNIFICANCE OF ISOLATING THIS ORGANISM FROM A SINGLE SET OF BLOOD CULTURES WHEN MULTIPLE SETS ARE DRAWN IS UNCERTAIN. PLEASE NOTIFY THE MICROBIOLOGY DEPARTMENT WITHIN ONE WEEK IF SPECIATION AND SENSITIVITIES ARE REQUIRED. Performed at [facility], [address] [place], [place] [address]    Report Status 08/03/2020 FINAL  Final  Blood Culture ID Panel (Reflexed)     Status: Abnormal   Collection Time: 07/30/20  1:27 AM  Result Value Ref Range Status   Enterococcus faecalis NOT DETECTED NOT DETECTED Final   Enterococcus Faecium NOT DETECTED NOT DETECTED Final   Listeria monocytogenes NOT DETECTED NOT DETECTED Final   Staphylococcus species DETECTED (A) NOT DETECTED Final    Comment: CRITICAL RESULT CALLED TO, READ BACK BY AND VERIFIED WITH: A WOLFE PHARMD 1417 08/01/20 A BROWNING    Staphylococcus aureus (BCID) NOT DETECTED NOT DETECTED Final   Staphylococcus epidermidis NOT DETECTED NOT DETECTED Final   Staphylococcus lugdunensis NOT DETECTED NOT DETECTED Final   Streptococcus species NOT DETECTED NOT DETECTED Final   Streptococcus agalactiae NOT DETECTED NOT DETECTED Final   Streptococcus pneumoniae NOT DETECTED NOT DETECTED Final   Streptococcus pyogenes NOT DETECTED NOT DETECTED Final   A.calcoaceticus-baumannii NOT DETECTED NOT DETECTED Final  Bacteroides fragilis NOT DETECTED NOT DETECTED Final   Enterobacterales NOT DETECTED NOT DETECTED Final   Enterobacter cloacae complex NOT DETECTED NOT DETECTED Final   Escherichia coli NOT DETECTED NOT DETECTED Final   Klebsiella aerogenes NOT DETECTED NOT DETECTED Final   Klebsiella oxytoca NOT DETECTED NOT DETECTED Final    Klebsiella pneumoniae NOT DETECTED NOT DETECTED Final   Proteus species NOT DETECTED NOT DETECTED Final   Salmonella species NOT DETECTED NOT DETECTED Final   Serratia marcescens NOT DETECTED NOT DETECTED Final   Haemophilus influenzae NOT DETECTED NOT DETECTED Final   Neisseria meningitidis NOT DETECTED NOT DETECTED Final   Pseudomonas aeruginosa NOT DETECTED NOT DETECTED Final   Stenotrophomonas maltophilia NOT DETECTED NOT DETECTED Final   Candida albicans NOT DETECTED NOT DETECTED Final   Candida auris NOT DETECTED NOT DETECTED Final   Candida glabrata NOT DETECTED NOT DETECTED Final   Candida krusei NOT DETECTED NOT DETECTED Final   Candida parapsilosis NOT DETECTED NOT DETECTED Final   Candida tropicalis NOT DETECTED NOT DETECTED Final   Cryptococcus neoformans/gattii NOT DETECTED NOT DETECTED Final    Comment: Performed at [facility], [address] [place], [place] [address]  Resp Panel by RT-PCR (Flu A&B, Covid) Nasopharyngeal Swab     Status: None   Collection Time: 07/30/20  1:45 AM   Specimen: Nasopharyngeal Swab; Nasopharyngeal(NP) swabs in vial transport medium  Result Value Ref Range Status   SARS Coronavirus 2 by RT PCR NEGATIVE NEGATIVE Final    Comment: (NOTE) SARS-CoV-2 target nucleic acids are NOT DETECTED.  The SARS-CoV-2 RNA is generally detectable in upper respiratory specimens during the acute phase of infection. The lowest concentration of SARS-CoV-2 viral copies this assay can detect is 138 copies/mL. A negative result does not preclude SARS-Cov-2 infection and should not be used as the sole basis for treatment or other patient management decisions. A negative result may occur with  improper specimen collection/handling, submission of specimen other than nasopharyngeal swab, presence of viral mutation(s) within the areas targeted by this assay, and inadequate number of viral copies(<138 copies/mL). A negative result must be combined with clinical  observations, patient history, and epidemiological information. The expected result is Negative.  Fact Sheet for Patients:  BloggerCourse.comhttps://www.fda.gov/media/152166/download  Fact Sheet for Healthcare Providers:  SeriousBroker.ithttps://www.fda.gov/media/152162/download  This test is no t yet approved or cleared by the [place] FDA and  has been authorized for detection and/or diagnosis of SARS-CoV-2 by FDA under an Emergency Use Authorization (EUA). This EUA will remain  in effect (meaning this test can be used) for the duration of the COVID-19 declaration under Section 564(b)(1) of the Act, 21 U.S.C.section 360bbb-3(b)(1), unless the authorization is terminated  or revoked sooner.       Influenza A by PCR NEGATIVE NEGATIVE Final   Influenza B by PCR NEGATIVE NEGATIVE Final    Comment: (NOTE) The Xpert Xpress SARS-CoV-2/FLU/RSV plus assay is intended as an aid in the diagnosis of influenza from Nasopharyngeal swab specimens and should not be used as a sole basis for treatment. Nasal washings and aspirates are unacceptable for Xpert Xpress SARS-CoV-2/FLU/RSV testing.  Fact Sheet for Patients: BloggerCourse.comhttps://www.fda.gov/media/152166/download  Fact Sheet for Healthcare Providers: SeriousBroker.ithttps://www.fda.gov/media/152162/download  This test is not yet approved or cleared by the [place] FDA and has been authorized for detection and/or diagnosis of SARS-CoV-2 by FDA under an Emergency Use Authorization (EUA). This EUA will remain in effect (meaning this test can be used) for the duration of the COVID-19 declaration under  Section 564(b)(1) of the Act, 21 U.S.C. section 360bbb-3(b)(1), unless the authorization is terminated or revoked.  Performed at [facility], [address] [place], [place] [address]   Blood Culture (routine x 2)     Status: None   Collection Time: 07/30/20  1:53 AM   Specimen: BLOOD  Result Value Ref Range Status   Specimen Description BLOOD SITE NOT SPECIFIED  Final    Special Requests   Final    BOTTLES DRAWN AEROBIC AND ANAEROBIC Blood Culture adequate volume   Culture   Final    NO GROWTH 6 DAYS Performed at [facility], [address] [place], [place] [address]    Report Status 08/05/2020 FINAL  Final  MRSA PCR Screening     Status: None   Collection Time: 07/30/20  3:31 PM   Specimen: Nasopharyngeal  Result Value Ref Range Status   MRSA by PCR NEGATIVE NEGATIVE Final    Comment:        The GeneXpert MRSA Assay (FDA approved for NASAL specimens only), is one component of a comprehensive MRSA colonization surveillance program. It is not intended to diagnose MRSA infection nor to guide or monitor treatment for MRSA infections. Performed at [facility], [address], [place], [place] [address]     RADIOLOGY STUDIES/RESULTS: No results found.   LOS: 7 days   Jeoffrey Massed, MD  [facility]    To contact the attending provider between 7A-7P or the covering provider during after hours 7P-7A, please log into the web site [email or web address] and access using universal [place] password for that web site. If you do not have the password, please call the hospital operator.  08/06/2020, 12:04 PM

## 2020-08-06 NOTE — Progress Notes (Signed)
Cortrak removed per order, patient tolerated well.

## 2020-08-06 NOTE — Progress Notes (Signed)
Per tech: for breakfast, patient ate less than 5% of breakfast.  For lunch, I attempted to feed/offer patient food several times. Patient was very sleepy and hard to arouse. Patient would open eyes for seconds upon stimulation, however, immediately after, patient would close eyes right back.  For dinner, I fed patient. I asked patient to hold spoon to assist to feed herself, patient seemed very weak and was not able to assist. Therefore, I continued to feed her. She ate approximately 3 bites of mashed potatoes, 1 bite of baked tilapia, 1 bite of carrots, all of her chocolate ice cream, and 2 sips from her tea. Essentially, patient consumed less than 5% of dinner.

## 2020-08-06 NOTE — Progress Notes (Signed)
Patient very sleepy and lethargic. Asked patient if she would like to eat breakfast, patient declined.   Offered breakfast 2 more times and patient declined.   For lunch, patient eat about 25% of the meal, was fed by the tech. Patient was unable/did not assist.  For dinner, I was present with the tech when patient was being fed. Patient  consumed 2 bites of dinner and started throwing up. Patient declined wanting to eat anything else. Will monitor and will report to oncoming shift to monitor patient.

## 2020-08-06 NOTE — Progress Notes (Signed)
Nutrition Brief Note  Chart reviewed. Pt now transitioning to comfort care.  No further nutrition interventions planned at this time.  Please re-consult as needed.   Kristy Formica, MS, RD, LDN RD pager number and weekend/on-call pager number located in Amion.    

## 2020-08-07 DIAGNOSIS — G9341 Metabolic encephalopathy: Secondary | ICD-10-CM | POA: Diagnosis not present

## 2020-08-07 DIAGNOSIS — G6281 Critical illness polyneuropathy: Secondary | ICD-10-CM | POA: Diagnosis not present

## 2020-08-07 DIAGNOSIS — R6520 Severe sepsis without septic shock: Secondary | ICD-10-CM | POA: Diagnosis not present

## 2020-08-07 DIAGNOSIS — E440 Moderate protein-calorie malnutrition: Secondary | ICD-10-CM | POA: Diagnosis not present

## 2020-08-07 DIAGNOSIS — A021 Salmonella sepsis: Secondary | ICD-10-CM | POA: Diagnosis not present

## 2020-08-07 DIAGNOSIS — N179 Acute kidney failure, unspecified: Secondary | ICD-10-CM | POA: Diagnosis not present

## 2020-08-07 DIAGNOSIS — A419 Sepsis, unspecified organism: Secondary | ICD-10-CM | POA: Diagnosis not present

## 2020-08-07 DIAGNOSIS — I471 Supraventricular tachycardia: Secondary | ICD-10-CM | POA: Diagnosis not present

## 2020-08-07 NOTE — TOC Progression Note (Addendum)
Transition of Care [facility]) - Progression Note    Patient Details  Name: Kristy Jensen MRN: [id number] Date of Birth: May 23, 1950  Transition of [facility] CM/SW Contact  Kristy Frederick, Kristy Jensen Phone Number: 08/07/2020, 8:59 AM  Clinical Narrative:   CSW spoke with son Kristy Jensen regarding referral to hospice.  Choice document placed in pt room.  Kristy Jensen does want to work with [employer] with goal of admission to [facility].  CSW spoke with Kristy Jensen at [facility] and made referral.    1045: message from Kristy Jensen, no [employer] available today.     Expected Discharge Plan:  (TBD) Barriers to Discharge: Continued Medical Work up,Other (comment) (identification of goals of care)  Expected Discharge Plan and Services Expected Discharge Plan:  (TBD)     Post Acute Care Choice:  (TBD) Living arrangements for the past 2 months: Skilled Nursing Facility ([facility])                                       Social Determinants of Health (SDOH) Interventions    Readmission Risk Interventions No flowsheet data found.

## 2020-08-07 NOTE — Progress Notes (Signed)
PROGRESS NOTE        PATIENT DETAILS Name: Kristy Jensen Age: 70 y.o. Sex: female Date of Birth: Aug 03, 1950 Admit Date: 07/30/2020 Admitting Physician Steffanie Dunn, DO [id number], No Pcp Per (Inactive)  Brief Narrative: Patient is a 70 y.o. female with history of CVA, PAF, HLD, HTN presenting to the hospital with altered mental status, A. fib with RVR, hyperkalemia and septic shock.  Significant events: 3/30>> admit to ICU 4/2>> transferred to [facility] 4/4>> dysphagia 2 diet started 4/6>> cortrack tube removed 4/6>> transitioned to comfort measures.  Significant studies: 2/13>> Echo: EF 55-60%, grade 1 diastolic dysfunction. 3/30>> CT head: No acute intracranial abnormality. 3/30>> CT renal stone study: Bibasilar pulmonary infiltrates, no acute [place] abdominal pathology. 3/30>> chest x-ray: Stable bibasilar pulmonary fibrotic changes. 3/30>> MRI brain: Increased size of acute to subacute infarcts of the right basal ganglia.   Antimicrobial therapy: Aztreonam: 3/29>>4/3 Flagyl: 3/29>> 3/30 Vancomycin: 3/29 x 1  Microbiology data: 3/30>> urine culture:<10,000 colonies/mL 3/30>> 1/2 blood culture: Staphylococcus capitis-likely contaminant  Procedures : None  Consults: CCM, neurology, palliative care  DVT Prophylaxis : Not needed as patient comfort measures.  Subjective: Lying comfortably in bed-very poor oral intake-claims she does not have an appetite.   Assessment/Plan: Septic shock due to complicated UTI: Sepsis physiology has improved-culture data as above-although urine cultures essentially negative-being treated as complicated UTI.  Has completed a course of aztreonam.  Acute metabolic encephalopathy: Felt to be due to AKI/sepsis-improving-minimally confused today-answering simple questions.  Continue supportive care.  Evaluated by neurology-although MRI brain showed increased size of the subacute infarct-she is not felt to have acute  CVA clinically.  All antiplatelet agents discontinued-as patient transitioned to full comfort measures.  PAF with RVR-SVT: Evaluated by cardiology during this hospital stay-however all medication/anticoagulation discontinued as patient transitioned to full comfort measures   AKI: Likely hemodynamically mediated-resolved.  Hypokalemia/hypomagnesemia/hypophosphatemia: Repleted.  Hypernatremia: Resolved  Normocytic anemia: Due to acute illness-no evidence of blood loss-monitor  Known right thyroid goiter with 1.7 cm nodule: TSH within normal limit-plans were for outpatient FNA-however since transitioned to comfort measures-doubt further work-up is required.    Recent CVA February 2022: No longer on anticoagulation once transitioned to full comfort measures.  Dysphagia: Probably chronic-worsened by encephalopathy/acute illness-did require NG tube feedings-evaluated by SLP on 4/4-dysphagia 2 diet started-tolerating diet.  However has very poor appetite.    Goals of care: Frail-recurrent hospitalizations-severe dysphagia-debilitated-DNR-palliative care meeting with family on 4/6.  Per my discussion with patient's son on 4/5-patient had significant decline in function over the past several months-very poor oral intake even prior to this hospitalization.  Due to dysphagia-she was on NG tube feedings-however once oral intake was started-she continued to exhibit very poor oral intake.  Extensive discussion with family by this MD-and subsequently by the palliative care team-on 4/6-patient was transitioned to full comfort measures with plans to transition to residential hospice on discharge.  Nutrition Problem: Nutrition Problem: Moderate Malnutrition Etiology: chronic illness Signs/Symptoms: mild fat depletion,moderate muscle depletion,percent weight loss (15.9% weight loss since 06/17/20) Percent weight loss: 15.9 % Interventions: Tube feeding  Obesity: Estimated body mass index is 32.59 kg/m as  calculated from the following:   Height as of this encounter: 5\' 9"  (1.753 m).   Weight as of this encounter: 100.1 kg.     Diet: Diet Order  Diet regular Room service appropriate? Yes; Fluid consistency: Thin  Diet effective now                  Code Status: DNR  Family Communication: Daughter-[name]-[phone]- on 4/5, son Amil Moseman at bedside. Son-[name] Braaten-[phone]-left a voicemail on 4/2.  Disposition Plan: Status is: Inpatient  Remains inpatient appropriate because:IV treatments appropriate due to intensity of illness or inability to take PO and Inpatient level of care appropriate due to severity of illness   Dispo: The patient is from: Home              Anticipated d/c is to: SNF versus residential hospice              Patient currently is  medically stable to d/c.   Difficult to place patient No    Barriers to Discharge: Waiting residential hospice bed  Antimicrobial agents: Anti-infectives (From admission, onward)   Start     Dose/Rate Route Frequency Ordered Stop   08/02/20 1800  aztreonam (AZACTAM) 1 g in sodium chloride 0.9 % 100 mL IVPB  Status:  Discontinued        1 g 200 mL/hr over 30 Minutes Intravenous Every 8 hours 08/02/20 0941 08/02/20 0947   08/02/20 1045  aztreonam (AZACTAM) 1 g in sodium chloride 0.9 % 100 mL IVPB        1 g 200 mL/hr over 30 Minutes Intravenous Every 8 hours 08/02/20 0947 08/03/20 1756   07/31/20 0800  vancomycin (VANCOREADY) IVPB 750 mg/150 mL  Status:  Discontinued        750 mg 150 mL/hr over 60 Minutes Intravenous Every 24 hours 07/31/20 0718 07/31/20 0816   07/30/20 1100  aztreonam (AZACTAM) 1 g in sodium chloride 0.9 % 100 mL IVPB  Status:  Discontinued        1 g 200 mL/hr over 30 Minutes Intravenous Every 8 hours 07/30/20 0329 08/02/20 0941   07/30/20 1000  metroNIDAZOLE (FLAGYL) IVPB 500 mg  Status:  Discontinued        500 mg 100 mL/hr over 60 Minutes Intravenous Every 8 hours 07/30/20 0408  07/31/20 0816   07/30/20 0415  vancomycin (VANCOREADY) IVPB 1000 mg/200 mL  Status:  Discontinued        1,000 mg 200 mL/hr over 60 Minutes Intravenous  Once 07/30/20 0408 07/30/20 0410   07/30/20 0329  vancomycin variable dose per unstable renal function (pharmacist dosing)  Status:  Discontinued         Does not apply See admin instructions 07/30/20 0329 07/31/20 0757   07/30/20 0115  vancomycin (VANCOREADY) IVPB 2000 mg/400 mL        2,000 mg 200 mL/hr over 120 Minutes Intravenous  Once 07/30/20 0103 07/30/20 0534   07/30/20 0100  aztreonam (AZACTAM) 2 g in sodium chloride 0.9 % 100 mL IVPB        2 g 200 mL/hr over 30 Minutes Intravenous  Once 07/30/20 0059 07/30/20 0214   07/30/20 0100  metroNIDAZOLE (FLAGYL) IVPB 500 mg        500 mg 100 mL/hr over 60 Minutes Intravenous  Once 07/30/20 0059 07/30/20 0341   07/30/20 0100  vancomycin (VANCOREADY) IVPB 1000 mg/200 mL  Status:  Discontinued        1,000 mg 200 mL/hr over 60 Minutes Intravenous  Once 07/30/20 0059 07/30/20 0103       Time spent: 15- minutes-Greater than 50% of this time was spent in counseling, explanation of diagnosis,  planning of further management, and coordination of care.  MEDICATIONS: Scheduled Meds: . antiseptic oral rinse  15 mL Topical BID   Continuous Infusions:  PRN Meds:.acetaminophen **OR** acetaminophen, docusate sodium, glycopyrrolate **OR** glycopyrrolate **OR** glycopyrrolate, haloperidol **OR** haloperidol **OR** haloperidol lactate, LORazepam **OR** LORazepam **OR** LORazepam, morphine injection, ondansetron **OR** ondansetron (ZOFRAN) IV, polyethylene glycol, polyvinyl alcohol   PHYSICAL EXAM: Vital signs: Vitals:   08/06/20 1200 08/06/20 1538 08/06/20 1600 08/06/20 1943  BP: (!) 121/58 130/64 110/74 126/60  Pulse: 73 84 74 80  Resp: 12  19 16   Temp:    98.5 F (36.9 C)  TempSrc:    Oral  SpO2: 97%  97% 98%  Weight:      Height:       Filed Weights   08/04/20 0517 08/05/20 0603  08/06/20 0643  Weight: 100.2 kg 100.2 kg 100.1 kg   Body mass index is 32.59 kg/m.   Gen Exam: Mildly confused-not in any distress HEENT:atraumatic, normocephalic Chest: B/L clear to auscultation anteriorly CVS:S1S2 regular Abdomen:soft non tender, non distended Extremities:no edema Neurology: Non focal Skin: no rash  I have personally reviewed following labs and imaging studies  LABORATORY DATA: CBC: Recent Labs  Lab 08/02/20 0048 08/03/20 0011 08/04/20 0359 08/05/20 0633 08/06/20 0226  WBC 5.3 5.6 6.4 6.8 6.2  HGB 10.2* 11.1* 11.0* 10.6* 9.9*  HCT 32.6* 34.9* 36.0 34.1* 31.9*  MCV 90.1 87.9 90.0 90.5 91.4  PLT 145* 71* 143* 167 182    Basic Metabolic Panel: Recent Labs  Lab 07/31/20 1945 08/01/20 0501 08/02/20 0048 08/03/20 0011 08/04/20 0359 08/05/20 0633 08/06/20 0226  NA  --  148* 142 143  143 146* 141  --   K  --  3.6 3.3* 4.1  4.1 4.5 4.9  --   CL  --  115* 113* 114*  115* 115* 111  --   CO2  --  22 22 22  23 25 25   --   GLUCOSE  --  124* 161* 146*  151* 177* 186*  --   BUN  --  51* 30* 19  21 17 13   --   CREATININE  --  1.11* 0.93 0.84  0.79 0.73 0.70  --   CALCIUM  --  8.0* 7.7* 8.2*  8.1* 8.5* 8.5*  --   MG 1.9 1.8 1.5* 1.6*  1.6* 2.3  --   --   PHOS 2.9 1.9* 2.2* 1.9* 1.6* 2.4* 2.6    GFR: Estimated Creatinine Clearance: 83.6 mL/min (by C-G formula based on SCr of 0.7 mg/dL).  Liver Function Tests: Recent Labs  Lab 08/03/20 0011  ALBUMIN 2.2*   No results for input(s): LIPASE, AMYLASE in the last 168 hours. No results for input(s): AMMONIA in the last 168 hours.  Coagulation Profile: No results for input(s): INR, PROTIME in the last 168 hours.  Cardiac Enzymes: No results for input(s): CKTOTAL, CKMB, CKMBINDEX, TROPONINI in the last 168 hours.  BNP (last 3 results) No results for input(s): PROBNP in the last 8760 hours.  Lipid Profile: No results for input(s): CHOL, HDL, LDLCALC, TRIG, CHOLHDL, LDLDIRECT in the last 72  hours.  Thyroid Function Tests: No results for input(s): TSH, T4TOTAL, FREET4, T3FREE, THYROIDAB in the last 72 hours.  Anemia Panel: No results for input(s): VITAMINB12, FOLATE, FERRITIN, TIBC, IRON, RETICCTPCT in the last 72 hours.  Urine analysis:    Component Value Date/Time   COLORURINE AMBER (A) 07/30/2020 0214   APPEARANCEUR TURBID (A) 07/30/2020 0214   LABSPEC  1.017 07/30/2020 0214   PHURINE 5.0 07/30/2020 0214   GLUCOSEU 50 (A) 07/30/2020 0214   HGBUR MODERATE (A) 07/30/2020 0214   BILIRUBINUR NEGATIVE 07/30/2020 0214   KETONESUR 5 (A) 07/30/2020 0214   PROTEINUR 100 (A) 07/30/2020 0214   NITRITE NEGATIVE 07/30/2020 0214   LEUKOCYTESUR MODERATE (A) 07/30/2020 0214    Sepsis Labs: Lactic Acid, Venous    Component Value Date/Time   LATICACIDVEN 2.1 (HH) 07/31/2020 0924    MICROBIOLOGY: Recent Results (from the past 240 hour(s))  Urine culture     Status: Abnormal   Collection Time: 07/30/20 12:59 AM   Specimen: In/Out Cath Urine  Result Value Ref Range Status   Specimen Description IN/OUT CATH URINE  Final   Special Requests NONE  Final   Culture (A)  Final    <10,000 COLONIES/mL INSIGNIFICANT GROWTH Performed at [facility], [address], [place], [place] [address]    Report Status 07/31/2020 FINAL  Final  Blood Culture (routine x 2)     Status: Abnormal   Collection Time: 07/30/20  1:27 AM   Specimen: BLOOD LEFT WRIST  Result Value Ref Range Status   Specimen Description BLOOD LEFT WRIST  Final   Special Requests   Final    BOTTLES DRAWN AEROBIC AND ANAEROBIC Blood Culture adequate volume   Culture  Setup Time   Final    GRAM POSITIVE COCCI IN CLUSTERS AEROBIC BOTTLE ONLY CRITICAL RESULT CALLED TO, READ BACK BY AND VERIFIED WITH: A WOLFE PHARMD 1417 08/01/20 A BROWNING    Culture (A)  Final    STAPHYLOCOCCUS CAPITIS THE SIGNIFICANCE OF ISOLATING THIS ORGANISM FROM A SINGLE SET OF BLOOD CULTURES WHEN MULTIPLE SETS ARE DRAWN IS UNCERTAIN. PLEASE  NOTIFY THE MICROBIOLOGY DEPARTMENT WITHIN ONE WEEK IF SPECIATION AND SENSITIVITIES ARE REQUIRED. Performed at [facility], [address], [place], [place] [address]    Report Status 08/03/2020 FINAL  Final  Blood Culture ID Panel (Reflexed)     Status: Abnormal   Collection Time: 07/30/20  1:27 AM  Result Value Ref Range Status   Enterococcus faecalis NOT DETECTED NOT DETECTED Final   Enterococcus Faecium NOT DETECTED NOT DETECTED Final   Listeria monocytogenes NOT DETECTED NOT DETECTED Final   Staphylococcus species DETECTED (A) NOT DETECTED Final    Comment: CRITICAL RESULT CALLED TO, READ BACK BY AND VERIFIED WITH: A WOLFE PHARMD 1417 08/01/20 A BROWNING    Staphylococcus aureus (BCID) NOT DETECTED NOT DETECTED Final   Staphylococcus epidermidis NOT DETECTED NOT DETECTED Final   Staphylococcus lugdunensis NOT DETECTED NOT DETECTED Final   Streptococcus species NOT DETECTED NOT DETECTED Final   Streptococcus agalactiae NOT DETECTED NOT DETECTED Final   Streptococcus pneumoniae NOT DETECTED NOT DETECTED Final   Streptococcus pyogenes NOT DETECTED NOT DETECTED Final   A.calcoaceticus-baumannii NOT DETECTED NOT DETECTED Final   Bacteroides fragilis NOT DETECTED NOT DETECTED Final   Enterobacterales NOT DETECTED NOT DETECTED Final   Enterobacter cloacae complex NOT DETECTED NOT DETECTED Final   Escherichia coli NOT DETECTED NOT DETECTED Final   Klebsiella aerogenes NOT DETECTED NOT DETECTED Final   Klebsiella oxytoca NOT DETECTED NOT DETECTED Final   Klebsiella pneumoniae NOT DETECTED NOT DETECTED Final   Proteus species NOT DETECTED NOT DETECTED Final   Salmonella species NOT DETECTED NOT DETECTED Final   Serratia marcescens NOT DETECTED NOT DETECTED Final   Haemophilus influenzae NOT DETECTED NOT DETECTED Final   Neisseria meningitidis NOT DETECTED NOT DETECTED Final   Pseudomonas  aeruginosa NOT DETECTED NOT DETECTED Final   Stenotrophomonas maltophilia NOT DETECTED NOT  DETECTED Final   Candida albicans NOT DETECTED NOT DETECTED Final   Candida auris NOT DETECTED NOT DETECTED Final   Candida glabrata NOT DETECTED NOT DETECTED Final   Candida krusei NOT DETECTED NOT DETECTED Final   Candida parapsilosis NOT DETECTED NOT DETECTED Final   Candida tropicalis NOT DETECTED NOT DETECTED Final   Cryptococcus neoformans/gattii NOT DETECTED NOT DETECTED Final    Comment: Performed at [facility], [address] [place], [place] [address]  Resp Panel by RT-PCR (Flu A&B, Covid) Nasopharyngeal Swab     Status: None   Collection Time: 07/30/20  1:45 AM   Specimen: Nasopharyngeal Swab; Nasopharyngeal(NP) swabs in vial transport medium  Result Value Ref Range Status   SARS Coronavirus 2 by RT PCR NEGATIVE NEGATIVE Final    Comment: (NOTE) SARS-CoV-2 target nucleic acids are NOT DETECTED.  The SARS-CoV-2 RNA is generally detectable in upper respiratory specimens during the acute phase of infection. The lowest concentration of SARS-CoV-2 viral copies this assay can detect is 138 copies/mL. A negative result does not preclude SARS-Cov-2 infection and should not be used as the sole basis for treatment or other patient management decisions. A negative result may occur with  improper specimen collection/handling, submission of specimen other than nasopharyngeal swab, presence of viral mutation(s) within the areas targeted by this assay, and inadequate number of viral copies(<138 copies/mL). A negative result must be combined with clinical observations, patient history, and epidemiological information. The expected result is Negative.  Fact Sheet for Patients:  BloggerCourse.com  Fact Sheet for Healthcare Providers:  SeriousBroker.it  This test is no t yet approved or cleared by the [place] FDA and  has been authorized for detection and/or diagnosis of SARS-CoV-2 by FDA under an Emergency Use  Authorization (EUA). This EUA will remain  in effect (meaning this test can be used) for the duration of the COVID-19 declaration under Section 564(b)(1) of the Act, 21 U.S.C.section 360bbb-3(b)(1), unless the authorization is terminated  or revoked sooner.       Influenza A by PCR NEGATIVE NEGATIVE Final   Influenza B by PCR NEGATIVE NEGATIVE Final    Comment: (NOTE) The Xpert Xpress SARS-CoV-2/FLU/RSV plus assay is intended as an aid in the diagnosis of influenza from Nasopharyngeal swab specimens and should not be used as a sole basis for treatment. Nasal washings and aspirates are unacceptable for Xpert Xpress SARS-CoV-2/FLU/RSV testing.  Fact Sheet for Patients: BloggerCourse.com  Fact Sheet for Healthcare Providers: SeriousBroker.it  This test is not yet approved or cleared by the [place] FDA and has been authorized for detection and/or diagnosis of SARS-CoV-2 by FDA under an Emergency Use Authorization (EUA). This EUA will remain in effect (meaning this test can be used) for the duration of the COVID-19 declaration under Section 564(b)(1) of the Act, 21 U.S.C. section 360bbb-3(b)(1), unless the authorization is terminated or revoked.  Performed at [facility], [address], [place], [place] [address]   Blood Culture (routine x 2)     Status: None   Collection Time: 07/30/20  1:53 AM   Specimen: BLOOD  Result Value Ref Range Status   Specimen Description BLOOD SITE NOT SPECIFIED  Final   Special Requests   Final    BOTTLES DRAWN AEROBIC AND ANAEROBIC Blood Culture adequate volume   Culture   Final    NO GROWTH 6 DAYS Performed at [facility],  [address] [place], [place] [address]    Report Status 08/05/2020 FINAL  Final  MRSA PCR Screening     Status: None   Collection Time: 07/30/20  3:31 PM   Specimen: Nasopharyngeal  Result Value Ref Range Status   MRSA by PCR NEGATIVE NEGATIVE Final     Comment:        The GeneXpert MRSA Assay (FDA approved for NASAL specimens only), is one component of a comprehensive MRSA colonization surveillance program. It is not intended to diagnose MRSA infection nor to guide or monitor treatment for MRSA infections. Performed at [facility], [address] [place], [place] [address]     RADIOLOGY STUDIES/RESULTS: No results found.   LOS: 8 days   Jeoffrey Massed, MD  [facility]    To contact the attending provider between 7A-7P or the covering provider during after hours 7P-7A, please log into the web site [email or web address] and access using universal [place] password for that web site. If you do not have the password, please call the hospital operator.  08/07/2020, 10:46 AM

## 2020-08-07 NOTE — Progress Notes (Signed)
Daily Progress Note   Patient Name: Kristy Jensen       Date: 08/07/2020 DOB: [birth year]  Age: 70 y.o. MRN#: [id number] Attending Physician: Maretta Bees, MD Primary Care Physician: Patient, No Pcp Per (Inactive) Admit Date: 07/30/2020  Reason for Consultation/Follow-up: end of life care, symptom management  Subjective: Patient appears comfortable. She is alert - will respond "yes" to questions. States she is cold, but no other meaningful communication. No non-verbal signs of pain or discomfort noted. Respirations are even and unlabored. No excessive respiratory secretions noted.   Spoke with bedside RN - patient with no oral intake today.   No family at bedside currently.    Length of Stay: 8  Current Medications: Scheduled Meds:  . antiseptic oral rinse  15 mL Topical BID      PRN Meds: acetaminophen **OR** acetaminophen, docusate sodium, glycopyrrolate **OR** glycopyrrolate **OR** glycopyrrolate, haloperidol **OR** haloperidol **OR** haloperidol lactate, LORazepam **OR** LORazepam **OR** LORazepam, morphine injection, ondansetron **OR** ondansetron (ZOFRAN) IV, polyethylene glycol, polyvinyl alcohol     Vital Signs: BP 126/60 (BP Location: Left Arm)   Pulse 80   Temp 98.5 F (36.9 C) (Oral)   Resp 16   Ht 5\' 9"  (1.753 m)   Wt 100.1 kg   SpO2 98%   BMI 32.59 kg/m  SpO2: SpO2: 98 % O2 Device: O2 Device: Room Air O2 Flow Rate: O2 Flow Rate (L/min): 2 L/min  Intake/output summary:   Intake/Output Summary (Last 24 hours) at 08/07/2020 1304 Last data filed at 08/06/2020 2153 Gross per 24 hour  Intake 122 ml  Output 200 ml  Net -78 ml   LBM: Last BM Date: 08/06/20 Baseline Weight: Weight: 99.8 kg Most recent weight: Weight: 100.1 kg       Palliative  Assessment/Data: PPS 20%        Palliative Care Assessment & Plan   Patient Profile: 70 y.o.femalewith past medical history of CVA, UTIs, Afib with SVT recently admitted and discharge February with UTI and AMS-admitted on 3/30/2022with alterend mental status, a fib and RVR.Additionally with acute kidney failure, hyperkalemia, hypernatremia. Admission complicated by sepsis from UTI and MRI on 3/30 indicating increased size in acute CVA. SLP eval indicates difficulty following commands, unable to demonstrate volitional swallow. Palliative medicine consulted GOC.  Assessment: Septic shock secondary to complicated UTI Acute metabolic  encephalopathy PAF with RVR-SVT Dysphasia Recent CVA  Recommendations/Plan: Continue full comfort measures DNR/DNI as previously documented Transfer to [facility] when bed is available Continue palliative wound care Nursing to assess frequently and administer PRN medications as clinically necessary to ensure EOL comfort PMT will continue to follow  Goals of Care and Additional Recommendations: Limitations on Scope of Treatment: Full Comfort Care  Code Status: DNR/DNI  Prognosis:  < 2 weeks  Discharge Planning: Hospice facility  Care plan was discussed with bedside RN, hospice liaison  Thank you for allowing the Palliative Medicine Team to assist in the care of this patient.   Total Time 15 minutes Prolonged Time Billed  no       Greater than 50%  of this time was spent counseling and coordinating care related to the above assessment and plan.  Merry Proud, NP  Please contact Palliative Medicine Team phone at [phone] for questions and concerns.

## 2020-08-07 NOTE — Progress Notes (Signed)
[facility] ([facility]) Hospital Liaison note.    Received request from TOC manager for family interest in [facility]. [facility] is unable to offer a room today. Hospital Liaison will follow up tomorrow or sooner if a room becomes available and eligibility is confirmed.   A Please do not hesitate to call with questions.    Thank you,   Mary Anne Robertson, RN, CCM      [facility] Hospital Liaison (listed on [facility] under Hospice /[facility])    [phone] 

## 2020-08-08 ENCOUNTER — Telehealth: Payer: Self-pay

## 2020-08-08 DIAGNOSIS — R6520 Severe sepsis without septic shock: Secondary | ICD-10-CM | POA: Diagnosis not present

## 2020-08-08 DIAGNOSIS — A021 Salmonella sepsis: Secondary | ICD-10-CM | POA: Diagnosis not present

## 2020-08-08 DIAGNOSIS — A419 Sepsis, unspecified organism: Secondary | ICD-10-CM | POA: Diagnosis not present

## 2020-08-08 DIAGNOSIS — G9341 Metabolic encephalopathy: Secondary | ICD-10-CM | POA: Diagnosis not present

## 2020-08-08 DIAGNOSIS — N390 Urinary tract infection, site not specified: Secondary | ICD-10-CM | POA: Diagnosis not present

## 2020-08-08 DIAGNOSIS — E440 Moderate protein-calorie malnutrition: Secondary | ICD-10-CM | POA: Diagnosis not present

## 2020-08-08 DIAGNOSIS — N179 Acute kidney failure, unspecified: Secondary | ICD-10-CM | POA: Diagnosis not present

## 2020-08-08 DIAGNOSIS — G6281 Critical illness polyneuropathy: Secondary | ICD-10-CM | POA: Diagnosis not present

## 2020-08-08 MED ORDER — LORAZEPAM 0.5 MG PO TABS
0.5000 mg | ORAL_TABLET | Freq: Two times a day (BID) | ORAL | Status: DC
  Administered 2020-08-08: 0.5 mg via ORAL
  Filled 2020-08-08: qty 1

## 2020-08-08 NOTE — Progress Notes (Addendum)
Civil engineer, contracting [facility]) Hospital Liaison note.    Addendum at 42: Spoke with pt's son [name] to discuss my assessment of pt yesterday and today.  Allowed space for [name] to share his frustrations at seemingly contradictory information.  Explained at length the admission criteria for BP and discussed the effect the quality of care can have on prognosis.  [name] verbalized understanding.  In sharing his mother's trajectory [name] mentioned that she is still paying for an apartment at [facility].  Discussed the scenario of pt returning to [facility] with hospice support. [name] shared plan to reach out to directors at [facility] to discuss.  [facility] Liaisons will follow up tomorrow.  Contact numbers provided to [facility].   Addendum:  Visited pt at bedside.  Pt alert, AOx2, enjoying a coke.  Student RN at bedside reports pt ate a portion of her soup.  Pt feeding herself crackers during this visit and watching TV.  TOC made aware. Pt would be eligible for hospice support should family chose for pt to retunr to LTC setting.  [facility] liaisons will continue to follow.  Chart and pt information have been reviewed by [facility] physician who feels that, with intake being bites and sips, life expectancy is liekyl >2 weeks, and, as such, is not eligible for [employer].  Plan made to reassess pt this afternoon at bedside.    [facility] is unable to offer a room today. Hospital Liaison will follow up tomorrow or sooner if a room becomes available. Please do not hesitate to call with questions.    Thank you for the opportunity to participate in this patient's care.  Gillian Scarce, BSN, RN [facility] Liaison (listed on [facility] under Hospice/[facility])    [phone] (24h on call)

## 2020-08-08 NOTE — Telephone Encounter (Signed)
[facility]) Hospital Liaison Note:  Referral received to evaluate patient for the Hospice Home in [place] [facility]). Medical records and patient information reviewed with [facility] MD who did not approve patient for inpatient hospice care. Per chart review, there may be a possibility of patient returning to [facility] with [facility] following. Message left for Ronny Flurry, RNCM with MD decision.  Please feel free to reach out to request another evaluation for [facility].  Thank you for the opportunity to participate in this patient's care.  Rayfield Citizen, RN [facility] Liaison [phone] (24h on-call availability)

## 2020-08-08 NOTE — Progress Notes (Signed)
Daily Progress Note   Patient Name: Kristy Jensen       Date: 08/08/2020 DOB: 1950/05/28  Age: 70 y.o. MRN#: [id number] Attending Physician: Maretta Bees, MD Primary Care Physician: Patient, No Pcp Per (Inactive) Admit Date: 07/30/2020  Reason for Consultation/Follow-up: end of life care, symptom management  Subjective: Notified by attending MD and LCSW that patient has not been accepted at [facility] due to clinical assessment that prognosis is not less than 2 weeks.   16:40--Spoke with bedside RN. She reports patient ate a bowl of soup and ate some crackers for lunch. Also drank a can of soda. At bedside, patient is calm and comfortable. Denies pain. She is able to answer simple questions.   Per communication between LCSW, hospice liasison, and patient's son - plan is for discharge to [facility] [place] with hospice care.   Length of Stay: 9  Current Medications: Scheduled Meds:  . antiseptic oral rinse  15 mL Topical BID  . LORazepam  0.5 mg Oral BID    Continuous Infusions:    PRN Meds: acetaminophen **OR** acetaminophen, docusate sodium, glycopyrrolate **OR** glycopyrrolate **OR** glycopyrrolate, haloperidol **OR** haloperidol **OR** haloperidol lactate, LORazepam **OR** LORazepam **OR** LORazepam, morphine injection, ondansetron **OR** ondansetron (ZOFRAN) IV, polyethylene glycol, polyvinyl alcohol  Physical Exam Constitutional:      General: She is not in acute distress.    Appearance: She is ill-appearing.     Comments: Frail  Pulmonary:     Effort: Pulmonary effort is normal.  Neurological:     Mental Status: She is alert.     Motor: Weakness present.     Comments: Oriented to self Answers simple questions  Psychiatric:        Cognition and Memory: Cognition is  impaired.             Vital Signs: BP 133/70 (BP Location: Left Arm)   Pulse 77   Temp 97.8 F (36.6 C) (Axillary)   Resp 17   Ht 5\' 9"  (1.753 m)   Wt 100.1 kg   SpO2 98%   BMI 32.59 kg/m  SpO2: SpO2: 98 % O2 Device: O2 Device: Room Air O2 Flow Rate: O2 Flow Rate (L/min): 2 L/min  Intake/output summary:   Intake/Output Summary (Last 24 hours) at 08/08/2020 1638 Last data filed at 08/08/2020 1258 Gross per  24 hour  Intake 120 ml  Output 1050 ml  Net -930 ml   LBM: Last BM Date: 08/07/20 Baseline Weight: Weight: 99.8 kg Most recent weight: Weight: 100.1 kg       Palliative Assessment/Data: PPS 30%       Palliative Care Assessment & Plan   Patient Profile: 70 y.o.femalewith past medical history of CVA, UTIs, Afib with SVT recently admitted and discharge February with UTI and AMS-admitted on 3/30/2022with alterend mental status, a fib and RVR.Additionally with acute kidney failure, hyperkalemia, hypernatremia. Admission complicated by sepsis from UTI and MRI on 3/30 indicating increased size in acute CVA. SLP eval indicates difficulty following commands, unable to demonstrate volitional swallow. Palliative medicine consulted GOC.  Assessment: Septic shock secondary to complicated UTI Acute metabolic encephalopathy PAF with RVR-SVT Dysphasia Recent CVA  Recommendations/Plan: Continue full comfort measures DNR/DNI as previously documented Continue palliative wound care Disposition plan is for discharge to [place] with hospice care  Goals of Care and Additional Recommendations:  Limitations on Scope of Treatment: full comfort care  Code Status: DNR/DNI  Prognosis:  months, less than 6 months  Discharge Planning: Assisted living with hospice care  Care plan was discussed with Dr. Jerral Ralph, LCSW, hospice liaison, bedside RN  Thank you for allowing the Palliative Medicine Team to assist in the care of this patient.   Total Time 25 minutes Prolonged  Time Billed  no      Greater than 50%  of this time was spent counseling and coordinating care related to the above assessment and plan.  Merry Proud, NP  Please contact Palliative Medicine Team phone at [phone] for questions and concerns.

## 2020-08-08 NOTE — Care Management (Signed)
Earlier NCM sent referral to [facility]. Hillary returned call , patient is not appropriate for residential hospice.   Ronny Flurry RN

## 2020-08-08 NOTE — Progress Notes (Signed)
PROGRESS NOTE        PATIENT DETAILS Name: Kristy GandyVirginia Upham Age: 70 y.o. Sex: female Date of Birth: 05-24-1950 Admit Date: 07/30/2020 Admitting Physician Steffanie DunnLaura P Clark, DO ZOX:[id number]:Patient, No Pcp Per (Inactive)  Brief Narrative: Patient is a 70 y.o. female with history of CVA, PAF, HLD, HTN presenting to the hospital with altered mental status, A. fib with RVR, hyperkalemia and septic shock.  Significant events: 3/30>> admit to ICU 4/2>> [facility] dysphagia 2 diet started 4/6>> cortrack tube removed 4/6>> transitioned to comfort measures.  Significant studies: 2/13>> Echo: EF 55-60%, grade 1 diastolic dysfunction. 3/30>> CT head: No acute intracranial abnormality. 3/30>> CT renal stone study: Bibasilar pulmonary infiltrates, no acute [place] abdominal pathology. 3/30>> chest x-ray: Stable bibasilar pulmonary fibrotic changes. 3/30>> MRI brain: Increased size of acute to subacute infarcts of the right basal ganglia.   Antimicrobial therapy: Aztreonam: 3/29>>4/3 Flagyl: 3/29>> 3/30 Vancomycin: 3/29 x 1  Microbiology data: 3/30>> urine culture:<10,000 colonies/mL 3/30>> 1/2 blood culture: Staphylococcus capitis-likely contaminant  Procedures : None  Consults: CCM, neurology, palliative care  DVT Prophylaxis : Not needed as patient comfort measures.  Subjective: Hardly any oral intake yesterday-did not eat breakfast this morning-ate some lunch.  Continues to have very poor overall oral intake.  Was sleeping when I walked him-awoke-did not have any major complaints.  Assessment/Plan: Septic shock due to complicated UTI: Sepsis physiology has improved-culture data as above-although urine cultures essentially negative-being treated as complicated UTI.  Has completed a course of aztreonam.  Acute metabolic encephalopathy: Felt to be due to AKI/sepsis-improving-minimally confused today-answering simple questions.  Continue supportive  care.  Evaluated by neurology-although MRI brain showed increased size of the subacute infarct-she is not felt to have acute CVA clinically.  All antiplatelet agents discontinued-as patient transitioned to full comfort measures.  PAF with RVR-SVT: Evaluated by cardiology during this hospital stay-however all medication/anticoagulation discontinued as patient transitioned to full comfort measures   AKI: Likely hemodynamically mediated-resolved.  Hypokalemia/hypomagnesemia/hypophosphatemia: Repleted.  Hypernatremia: Resolved  Normocytic anemia: Due to acute illness-no evidence of blood loss-monitor  Known right thyroid goiter with 1.7 cm nodule: TSH within normal limit-plans were for outpatient FNA-however since transitioned to comfort measures-doubt further work-up is required.    Recent CVA February 2022: No longer on anticoagulation once transitioned to full comfort measures.  Dysphagia: Probably chronic-worsened by encephalopathy/acute illness-did require NG tube feedings-evaluated by SLP on 4/4-dysphagia 2 diet started-tolerating diet-however continues to have very poor oral intake.  Per nursing staff-did not eat breakfast at all.  Hardly any oral intake yesterday.  Goals of care: Frail-recurrent hospitalizations-severe dysphagia-debilitated-DNR-palliative care meeting with family on 4/6.  Per my discussion with patient's son on 4/5-patient had significant decline in function over the past several months-very poor oral intake even prior to this hospitalization.  Due to dysphagia-she was on NG tube feedings-however once oral intake was started-she continued to exhibit very poor oral intake.  Extensive discussion with family by this MD-and subsequently by the palliative care team-on 4/6-patient was transitioned to full comfort measures with plans to transition to residential hospice on discharge.  Nutrition Problem: Nutrition Problem: Moderate Malnutrition Etiology: chronic  illness Signs/Symptoms: mild fat depletion,moderate muscle depletion,percent weight loss (15.9% weight loss since 06/17/20) Percent weight loss: 15.9 % Interventions: Tube feeding  Obesity: Estimated body mass index is 32.59 kg/m as calculated from the following:  Height as of this encounter: 5\' 9"  (1.753 m).   Weight as of this encounter: 100.1 kg.     Diet: Diet Order            Diet regular Room service appropriate? Yes; Fluid consistency: Thin  Diet effective now                  Code Status: DNR  Family Communication: Daughter-[name]- on 4/5, son Quana Chamberlain at bedside. Son-[name] Langelier-[phone]-left a voicemail on 4/2.  Disposition Plan: Status is: Inpatient  Remains inpatient appropriate because:IV treatments appropriate due to intensity of illness or inability to take PO and Inpatient level of care appropriate due to severity of illness   Dispo: The patient is from: Home              Anticipated d/c is to: SNF versus residential hospice              Patient currently is  medically stable to d/c.   Difficult to place patient No    Barriers to Discharge: Waiting residential hospice bed  Antimicrobial agents: Anti-infectives (From admission, onward)   Start     Dose/Rate Route Frequency Ordered Stop   08/02/20 1800  aztreonam (AZACTAM) 1 g in sodium chloride 0.9 % 100 mL IVPB  Status:  Discontinued        1 g 200 mL/hr over 30 Minutes Intravenous Every 8 hours 08/02/20 0941 08/02/20 0947   08/02/20 1045  aztreonam (AZACTAM) 1 g in sodium chloride 0.9 % 100 mL IVPB        1 g 200 mL/hr over 30 Minutes Intravenous Every 8 hours 08/02/20 0947 08/03/20 1756   07/31/20 0800  vancomycin (VANCOREADY) IVPB 750 mg/150 mL  Status:  Discontinued        750 mg 150 mL/hr over 60 Minutes Intravenous Every 24 hours 07/31/20 0718 07/31/20 0816   07/30/20 1100  aztreonam (AZACTAM) 1 g in sodium chloride 0.9 % 100 mL IVPB  Status:  Discontinued        1  g 200 mL/hr over 30 Minutes Intravenous Every 8 hours 07/30/20 0329 08/02/20 0941   07/30/20 1000  metroNIDAZOLE (FLAGYL) IVPB 500 mg  Status:  Discontinued        500 mg 100 mL/hr over 60 Minutes Intravenous Every 8 hours 07/30/20 0408 07/31/20 0816   07/30/20 0415  vancomycin (VANCOREADY) IVPB 1000 mg/200 mL  Status:  Discontinued        1,000 mg 200 mL/hr over 60 Minutes Intravenous  Once 07/30/20 0408 07/30/20 0410   07/30/20 0329  vancomycin variable dose per unstable renal function (pharmacist dosing)  Status:  Discontinued         Does not apply See admin instructions 07/30/20 0329 07/31/20 0757   07/30/20 0115  vancomycin (VANCOREADY) IVPB 2000 mg/400 mL        2,000 mg 200 mL/hr over 120 Minutes Intravenous  Once 07/30/20 0103 07/30/20 0534   07/30/20 0100  aztreonam (AZACTAM) 2 g in sodium chloride 0.9 % 100 mL IVPB        2 g 200 mL/hr over 30 Minutes Intravenous  Once 07/30/20 0059 07/30/20 0214   07/30/20 0100  metroNIDAZOLE (FLAGYL) IVPB 500 mg        500 mg 100 mL/hr over 60 Minutes Intravenous  Once 07/30/20 0059 07/30/20 0341   07/30/20 0100  vancomycin (VANCOREADY) IVPB 1000 mg/200 mL  Status:  Discontinued  1,000 mg 200 mL/hr over 60 Minutes Intravenous  Once 07/30/20 0059 07/30/20 0103       Time spent: 15- minutes-Greater than 50% of this time was spent in counseling, explanation of diagnosis, planning of further management, and coordination of care.  MEDICATIONS: Scheduled Meds: . antiseptic oral rinse  15 mL Topical BID  . LORazepam  0.5 mg Oral BID   Continuous Infusions:  PRN Meds:.acetaminophen **OR** acetaminophen, docusate sodium, glycopyrrolate **OR** glycopyrrolate **OR** glycopyrrolate, haloperidol **OR** haloperidol **OR** haloperidol lactate, LORazepam **OR** LORazepam **OR** LORazepam, morphine injection, ondansetron **OR** ondansetron (ZOFRAN) IV, polyethylene glycol, polyvinyl alcohol   PHYSICAL EXAM: Vital signs: Vitals:   08/07/20  1623 08/07/20 1844 08/08/20 0528 08/08/20 0602  BP: 130/68 (!) 147/73 140/70 133/70  Pulse: 80 81 75 77  Resp: Temp: (!) 97.3 F (36.3 C) (!) 97.4 F (36.3 C) 97.9 F (36.6 C) 97.8 F (36.6 C)  TempSrc:  Oral  Axillary  SpO2: 99% 98% 97% 98%  Weight:      Height:       Filed Weights   08/04/20 0517 08/05/20 0603 08/06/20 0643  Weight: 100.2 kg 100.2 kg 100.1 kg   Body mass index is 32.59 kg/m.   Gen Exam: Mildly confused-not in any distress HEENT:atraumatic, normocephalic Chest: B/L clear to auscultation anteriorly CVS:S1S2 regular Abdomen:soft non tender, non distended Extremities:no edema Neurology: Non focal Skin: no rash  I have personally reviewed following labs and imaging studies  LABORATORY DATA: CBC: Recent Labs  Lab 08/02/20 0048 08/03/20 0011 08/04/20 0359 08/05/20 0633 08/06/20 0226  WBC 5.3 5.6 6.4 6.8 6.2  HGB 10.2* 11.1* 11.0* 10.6* 9.9*  HCT 32.6* 34.9* 36.0 34.1* 31.9*  MCV 90.1 87.9 90.0 90.5 91.4  PLT 145* 71* 143* 167 182    Basic Metabolic Panel: Recent Labs  Lab 08/02/20 0048 08/03/20 0011 08/04/20 0359 08/05/20 0633 08/06/20 0226  NA 142 143  143 146* 141  --   K 3.3* 4.1  4.1 4.5 4.9  --   CL 113* 114*  115* 115* 111  --   CO2 --   GLUCOSE 161* 146*  151* 177* 186*  --   BUN 30* --   CREATININE 0.93 0.84  0.79 0.73 0.70  --   CALCIUM 7.7* 8.2*  8.1* 8.5* 8.5*  --   MG 1.5* 1.6*  1.6* 2.3  --   --   PHOS 2.2* 1.9* 1.6* 2.4* 2.6    GFR: Estimated Creatinine Clearance: 83.6 mL/min (by C-G formula based on SCr of 0.7 mg/dL).  Liver Function Tests: Recent Labs  Lab 08/03/20 0011  ALBUMIN 2.2*   No results for input(s): LIPASE, AMYLASE in the last 168 hours. No results for input(s): AMMONIA in the last 168 hours.  Coagulation Profile: No results for input(s): INR, PROTIME in the last 168 hours.  Cardiac Enzymes: No results for input(s): CKTOTAL, CKMB, CKMBINDEX,  TROPONINI in the last 168 hours.  BNP (last 3 results) No results for input(s): PROBNP in the last 8760 hours.  Lipid Profile: No results for input(s): CHOL, HDL, LDLCALC, TRIG, CHOLHDL, LDLDIRECT in the last 72 hours.  Thyroid Function Tests: No results for input(s): TSH, T4TOTAL, FREET4, T3FREE, THYROIDAB in the last 72 hours.  Anemia Panel: No results for input(s): VITAMINB12, FOLATE, FERRITIN, TIBC, IRON, RETICCTPCT in the last 72 hours.  Urine analysis:    Component Value Date/Time  COLORURINE AMBER (A) 07/30/2020 0214   APPEARANCEUR TURBID (A) 07/30/2020 0214   LABSPEC 1.017 07/30/2020 0214   PHURINE 5.0 07/30/2020 0214   GLUCOSEU 50 (A) 07/30/2020 0214   HGBUR MODERATE (A) 07/30/2020 0214   BILIRUBINUR NEGATIVE 07/30/2020 0214   KETONESUR 5 (A) 07/30/2020 0214   PROTEINUR 100 (A) 07/30/2020 0214   NITRITE NEGATIVE 07/30/2020 0214   LEUKOCYTESUR MODERATE (A) 07/30/2020 0214    Sepsis Labs: Lactic Acid, Venous    Component Value Date/Time   LATICACIDVEN 2.1 (HH) 07/31/2020 0924    MICROBIOLOGY: Recent Results (from the past 240 hour(s))  Urine culture     Status: Abnormal   Collection Time: 07/30/20 12:59 AM   Specimen: In/Out Cath Urine  Result Value Ref Range Status   Specimen Description IN/OUT CATH URINE  Final   Special Requests NONE  Final   Culture (A)  Final    <10,000 COLONIES/mL INSIGNIFICANT GROWTH Performed at [facility], [address], [place], [place] [address]    Report Status 07/31/2020 FINAL  Final  Blood Culture (routine x 2)     Status: Abnormal   Collection Time: 07/30/20  1:27 AM   Specimen: BLOOD LEFT WRIST  Result Value Ref Range Status   Specimen Description BLOOD LEFT WRIST  Final   Special Requests   Final    BOTTLES DRAWN AEROBIC AND ANAEROBIC Blood Culture adequate volume   Culture  Setup Time   Final    GRAM POSITIVE COCCI IN CLUSTERS AEROBIC BOTTLE ONLY CRITICAL RESULT CALLED TO, READ BACK BY AND VERIFIED WITH: A  WOLFE PHARMD 1417 08/01/20 A BROWNING    Culture (A)  Final    STAPHYLOCOCCUS CAPITIS THE SIGNIFICANCE OF ISOLATING THIS ORGANISM FROM A SINGLE SET OF BLOOD CULTURES WHEN MULTIPLE SETS ARE DRAWN IS UNCERTAIN. PLEASE NOTIFY THE MICROBIOLOGY DEPARTMENT WITHIN ONE WEEK IF SPECIATION AND SENSITIVITIES ARE REQUIRED. Performed at [facility], [address] [place], [place] [address]    Report Status 08/03/2020 FINAL  Final  Blood Culture ID Panel (Reflexed)     Status: Abnormal   Collection Time: 07/30/20  1:27 AM  Result Value Ref Range Status   Enterococcus faecalis NOT DETECTED NOT DETECTED Final   Enterococcus Faecium NOT DETECTED NOT DETECTED Final   Listeria monocytogenes NOT DETECTED NOT DETECTED Final   Staphylococcus species DETECTED (A) NOT DETECTED Final    Comment: CRITICAL RESULT CALLED TO, READ BACK BY AND VERIFIED WITH: A WOLFE PHARMD 1417 08/01/20 A BROWNING    Staphylococcus aureus (BCID) NOT DETECTED NOT DETECTED Final   Staphylococcus epidermidis NOT DETECTED NOT DETECTED Final   Staphylococcus lugdunensis NOT DETECTED NOT DETECTED Final   Streptococcus species NOT DETECTED NOT DETECTED Final   Streptococcus agalactiae NOT DETECTED NOT DETECTED Final   Streptococcus pneumoniae NOT DETECTED NOT DETECTED Final   Streptococcus pyogenes NOT DETECTED NOT DETECTED Final   A.calcoaceticus-baumannii NOT DETECTED NOT DETECTED Final   Bacteroides fragilis NOT DETECTED NOT DETECTED Final   Enterobacterales NOT DETECTED NOT DETECTED Final   Enterobacter cloacae complex NOT DETECTED NOT DETECTED Final   Escherichia coli NOT DETECTED NOT DETECTED Final   Klebsiella aerogenes NOT DETECTED NOT DETECTED Final   Klebsiella oxytoca NOT DETECTED NOT DETECTED Final   Klebsiella pneumoniae NOT DETECTED NOT DETECTED Final   Proteus species NOT DETECTED NOT DETECTED Final   Salmonella species NOT DETECTED NOT DETECTED Final   Serratia marcescens NOT DETECTED NOT DETECTED Final    Haemophilus influenzae NOT  DETECTED NOT DETECTED Final   Neisseria meningitidis NOT DETECTED NOT DETECTED Final   Pseudomonas aeruginosa NOT DETECTED NOT DETECTED Final   Stenotrophomonas maltophilia NOT DETECTED NOT DETECTED Final   Candida albicans NOT DETECTED NOT DETECTED Final   Candida auris NOT DETECTED NOT DETECTED Final   Candida glabrata NOT DETECTED NOT DETECTED Final   Candida krusei NOT DETECTED NOT DETECTED Final   Candida parapsilosis NOT DETECTED NOT DETECTED Final   Candida tropicalis NOT DETECTED NOT DETECTED Final   Cryptococcus neoformans/gattii NOT DETECTED NOT DETECTED Final    Comment: Performed at [facility], [address], [place], [place] [address]  Resp Panel by RT-PCR (Flu A&B, Covid) Nasopharyngeal Swab     Status: None   Collection Time: 07/30/20  1:45 AM   Specimen: Nasopharyngeal Swab; Nasopharyngeal(NP) swabs in vial transport medium  Result Value Ref Range Status   SARS Coronavirus 2 by RT PCR NEGATIVE NEGATIVE Final    Comment: (NOTE) SARS-CoV-2 target nucleic acids are NOT DETECTED.  The SARS-CoV-2 RNA is generally detectable in upper respiratory specimens during the acute phase of infection. The lowest concentration of SARS-CoV-2 viral copies this assay can detect is 138 copies/mL. A negative result does not preclude SARS-Cov-2 infection and should not be used as the sole basis for treatment or other patient management decisions. A negative result may occur with  improper specimen collection/handling, submission of specimen other than nasopharyngeal swab, presence of viral mutation(s) within the areas targeted by this assay, and inadequate number of viral copies(<138 copies/mL). A negative result must be combined with clinical observations, patient history, and epidemiological information. The expected result is Negative.  Fact Sheet for Patients:  BloggerCourse.com  Fact Sheet for Healthcare Providers:   SeriousBroker.it  This test is no t yet approved or cleared by the [place] FDA and  has been authorized for detection and/or diagnosis of SARS-CoV-2 by FDA under an Emergency Use Authorization (EUA). This EUA will remain  in effect (meaning this test can be used) for the duration of the COVID-19 declaration under Section 564(b)(1) of the Act, 21 U.S.C.section 360bbb-3(b)(1), unless the authorization is terminated  or revoked sooner.       Influenza A by PCR NEGATIVE NEGATIVE Final   Influenza B by PCR NEGATIVE NEGATIVE Final    Comment: (NOTE) The Xpert Xpress SARS-CoV-2/FLU/RSV plus assay is intended as an aid in the diagnosis of influenza from Nasopharyngeal swab specimens and should not be used as a sole basis for treatment. Nasal washings and aspirates are unacceptable for Xpert Xpress SARS-CoV-2/FLU/RSV testing.  Fact Sheet for Patients: BloggerCourse.com  Fact Sheet for Healthcare Providers: SeriousBroker.it  This test is not yet approved or cleared by the [place] FDA and has been authorized for detection and/or diagnosis of SARS-CoV-2 by FDA under an Emergency Use Authorization (EUA). This EUA will remain in effect (meaning this test can be used) for the duration of the COVID-19 declaration under Section 564(b)(1) of the Act, 21 U.S.C. section 360bbb-3(b)(1), unless the authorization is terminated or revoked.  Performed at [facility], [address] [place], [place] [address]   Blood Culture (routine x 2)     Status: None   Collection Time: 07/30/20  1:53 AM   Specimen: BLOOD  Result Value Ref Range Status   Specimen Description BLOOD SITE NOT SPECIFIED  Final   Special Requests   Final    BOTTLES DRAWN AEROBIC AND ANAEROBIC Blood Culture adequate volume   Culture  Final    NO GROWTH 6 DAYS Performed at [facility], [address], [place], [place] [address]     Report Status 08/05/2020 FINAL  Final  MRSA PCR Screening     Status: None   Collection Time: 07/30/20  3:31 PM   Specimen: Nasopharyngeal  Result Value Ref Range Status   MRSA by PCR NEGATIVE NEGATIVE Final    Comment:        The GeneXpert MRSA Assay (FDA approved for NASAL specimens only), is one component of a comprehensive MRSA colonization surveillance program. It is not intended to diagnose MRSA infection nor to guide or monitor treatment for MRSA infections. Performed at [facility], [address], [place], [place] [address]     RADIOLOGY STUDIES/RESULTS: No results found.   LOS: 9 days   Jeoffrey Massed, MD  [facility]    To contact the attending provider between 7A-7P or the covering provider during after hours 7P-7A, please log into the web site [email or web address] and access using universal [place] password for that web site. If you do not have the password, please call the hospital operator.  08/08/2020, 3:32 PM

## 2020-08-09 DIAGNOSIS — R5381 Other malaise: Secondary | ICD-10-CM

## 2020-08-09 DIAGNOSIS — I4891 Unspecified atrial fibrillation: Secondary | ICD-10-CM | POA: Diagnosis not present

## 2020-08-09 DIAGNOSIS — G6281 Critical illness polyneuropathy: Secondary | ICD-10-CM | POA: Diagnosis not present

## 2020-08-09 DIAGNOSIS — R6520 Severe sepsis without septic shock: Secondary | ICD-10-CM | POA: Diagnosis not present

## 2020-08-09 DIAGNOSIS — N179 Acute kidney failure, unspecified: Secondary | ICD-10-CM | POA: Diagnosis not present

## 2020-08-09 DIAGNOSIS — A021 Salmonella sepsis: Secondary | ICD-10-CM | POA: Diagnosis not present

## 2020-08-09 DIAGNOSIS — N390 Urinary tract infection, site not specified: Secondary | ICD-10-CM | POA: Diagnosis not present

## 2020-08-09 MED ORDER — LORAZEPAM 1 MG PO TABS: 1.0000 mg | ORAL_TABLET | ORAL | 0 refills | Status: AC | PRN

## 2020-08-09 MED ORDER — MORPHINE SULFATE (CONCENTRATE) 10 MG /0.5 ML PO SOLN: 5.0000 mg | ORAL | 0 refills | Status: AC | PRN

## 2020-08-09 MED ORDER — LORAZEPAM 1 MG PO TABS: 1.0000 mg | ORAL_TABLET | Freq: Two times a day (BID) | ORAL | Status: DC

## 2020-08-09 MED ORDER — LORAZEPAM 0.5 MG PO TABS
0.5000 mg | ORAL_TABLET | Freq: Two times a day (BID) | ORAL | Status: DC
  Administered 2020-08-09 – 2020-08-13 (×9): 0.5 mg via ORAL
  Filled 2020-08-09 (×9): qty 1

## 2020-08-09 MED ORDER — LORAZEPAM 0.5 MG PO TABS: 0.5000 mg | ORAL_TABLET | Freq: Two times a day (BID) | ORAL | 0 refills | Status: AC

## 2020-08-09 NOTE — Progress Notes (Signed)
Civil engineer, contracting [facility]) Hospital Liaison note.    Visited pt at bedside, AOx2, asking about chance of rain today and other social conversation with some signs of confusion.  Pt had a soda at bedside and was sipping.  Report exchanged with bedside RN and Joice Lofts, NP with PMT.  Discussed with Miranda, TOC.  [facility] hospice available to support in ALF as per note on 08/08/20.  Thank you for the opportunity to participate in this patient's care.  Gillian Scarce, BSN, RN [facility] Liaison (listed on [place] under Hospice[facility])    [phone] (24h on call)

## 2020-08-09 NOTE — Progress Notes (Signed)
Discussed with palliative care-disposition still not clear-either residential hospice-if that is not available-then family is agreeable to go back to ALF with hospice follow-up.  Hold discharge till disposition is more clear.

## 2020-08-09 NOTE — TOC Progression Note (Signed)
Transition of Care [facility]) - Progression Note    Patient Details  Name: Kristy Jensen MRN: [id number] Date of Birth: Sep 15, 1950  Transition of Care [facility]) CM/SW Contact  Kristy Jensen, [place] Phone Number: 08/09/2020, 1:07 PM  Clinical Narrative:     Pts son Kristy Jensen prefer pt to return to [facility] assisted living, [place] [facility] with hospice support. CSW  is awaiting a call back from [employer]. Pt will most likely not be able to discharge until Monday. Pt will need a covid test ordered tomorrow for a Monday DC.   Expected Discharge Plan:  (TBD) Barriers to Discharge: Continued Medical Work up,Other (comment) (identification of goals of care)  Expected Discharge Plan and Services Expected Discharge Plan:  (TBD)     Post Acute Care Choice:  (TBD) Living arrangements for the past 2 months: Skilled Nursing Facility [facility]) Expected Discharge Date: 08/09/20                                     Social Determinants of Health (SDOH) Interventions    Readmission Risk Interventions No flowsheet data found.  Kristy Jensen, Kristy Jensen, [place] Clinical Social Worker [phone]

## 2020-08-09 NOTE — Progress Notes (Addendum)
PROGRESS NOTE        PATIENT DETAILS Name: Kristy GandyVirginia Jensen Age: 70 y.o. Sex: female Date of Birth: [birth year] Admit Date: 07/30/2020 Admitting Physician Steffanie DunnLaura P Clark, DO ZOX:[id number]:Patient, No Pcp Per (Inactive)  Brief Narrative: Patient is a 70 y.o. female with history of CVA, PAF, HLD, HTN presenting to the hospital with altered mental status, A. fib with RVR, hyperkalemia and septic shock.  Significant events: 3/30>> admit to ICU 4/2>> [facility] dysphagia 2 diet started 4/6>> cortrack tube removed 4/6>> transitioned to comfort measures.  Significant studies: 2/13>> Echo: EF 55-60%, grade 1 diastolic dysfunction. 3/30>> CT head: No acute intracranial abnormality. 3/30>> CT renal stone study: Bibasilar pulmonary infiltrates, no acute [place] abdominal pathology. 3/30>> chest x-ray: Stable bibasilar pulmonary fibrotic changes. 3/30>> MRI brain: Increased size of acute to subacute infarcts of the right basal ganglia.   Antimicrobial therapy: Aztreonam: 3/29>>4/3 Flagyl: 3/29>> 3/30 Vancomycin: 3/29 x 1  Microbiology data: 3/30>> urine culture:<10,000 colonies/mL 3/30>> 1/2 blood culture: Staphylococcus capitis-likely contaminant  Procedures : None  Consults: CCM, neurology, palliative care  DVT Prophylaxis : Not needed as patient comfort measures.  Subjective:  Burgess EstelleYesterday was apparently the first day when she ate some soup and crackers for lunch in quite a while-but did not eat dinner-and has no appetite for breakfast his morning   Assessment/Plan: Septic shock due to complicated UTI: Sepsis physiology has improved-culture data as above-although urine cultures essentially negative-being treated as complicated UTI.  Has completed a course of aztreonam.  Acute metabolic encephalopathy: Felt to be due to AKI/sepsis-improving-minimally confused today-answering simple questions.  Continue supportive care.  Evaluated by neurology-although  MRI brain showed increased size of the subacute infarct-she is not felt to have acute CVA clinically.  All antiplatelet agents discontinued-as patient transitioned to full comfort measures.  PAF with RVR-SVT: Evaluated by cardiology during this hospital stay-however all medication/anticoagulation discontinued as patient transitioned to full comfort measures   AKI: Likely hemodynamically mediated-resolved.  Hypokalemia/hypomagnesemia/hypophosphatemia: Repleted.  Hypernatremia: Resolved  Normocytic anemia: Due to acute illness-no evidence of blood loss-monitor  Known right thyroid goiter with 1.7 cm nodule: TSH within normal limit-plans were for outpatient FNA-however since transitioned to comfort measures-doubt further work-up is required.    Recent CVA February 2022: No longer on anticoagulation once transitioned to full comfort measures.  Dysphagia: Probably chronic-worsened by encephalopathy/acute illness-did require NG tube feedings-evaluated by SLP on 4/4-dysphagia 2 diet started-tolerating diet-however continues to have very poor oral intake-due to very poor appetite  Goals of care: Frail-recurrent hospitalizations-severe dysphagia-debilitated-DNR-palliative care meeting with family on 4/6.  Per my discussion with patient's son on 4/5-patient had significant decline in function over the past several months-very poor oral intake even prior to this hospitalization.  Due to dysphagia-she was on NG tube feedings-however once oral intake was started-she continued to exhibit very poor oral intake.  Extensive discussion with family by this MD-and subsequently by the palliative care team-on 4/6-patient was transitioned to full comfort measures with plans to transition to residential hospice on discharge-however per hospice-patient may not qualify from residential hospice (see notes). Will await further input from Palliative care team-regarding appropriate disposition.  Nutrition Problem: Nutrition  Problem: Moderate Malnutrition Etiology: chronic illness Signs/Symptoms: mild fat depletion,moderate muscle depletion,percent weight loss (15.9% weight loss since 06/17/20) Percent weight loss: 15.9 % Interventions: Tube feeding  Obesity: Estimated body mass index is  32.59 kg/m as calculated from the following:   Height as of this encounter: 5\' 9"  (1.753 m).   Weight as of this encounter: 100.1 kg.     Diet: Diet Order            Diet regular Room service appropriate? Yes; Fluid consistency: Thin  Diet effective now                  Code Status: DNR  Family Communication: Daughter-[name]- on 4/5, son Sylina Henion at bedside. Son-[name] Beranek-[phone]-left a voicemail on 4/2.  Disposition Plan: Status is: Inpatient  Remains inpatient appropriate because:IV treatments appropriate due to intensity of illness or inability to take PO and Inpatient level of care appropriate due to severity of illness   Dispo: The patient is from: Home              Anticipated d/c is to: SNF versus residential hospice              Patient currently is  medically stable to d/c.   Difficult to place patient No    Barriers to Discharge: Waiting residential hospice bed  Antimicrobial agents: Anti-infectives (From admission, onward)   Start     Dose/Rate Route Frequency Ordered Stop   08/02/20 1800  aztreonam (AZACTAM) 1 g in sodium chloride 0.9 % 100 mL IVPB  Status:  Discontinued        1 g 200 mL/hr over 30 Minutes Intravenous Every 8 hours 08/02/20 0941 08/02/20 0947   08/02/20 1045  aztreonam (AZACTAM) 1 g in sodium chloride 0.9 % 100 mL IVPB        1 g 200 mL/hr over 30 Minutes Intravenous Every 8 hours 08/02/20 0947 08/03/20 1756   07/31/20 0800  vancomycin (VANCOREADY) IVPB 750 mg/150 mL  Status:  Discontinued        750 mg 150 mL/hr over 60 Minutes Intravenous Every 24 hours 07/31/20 0718 07/31/20 0816   07/30/20 1100  aztreonam (AZACTAM) 1 g in sodium chloride  0.9 % 100 mL IVPB  Status:  Discontinued        1 g 200 mL/hr over 30 Minutes Intravenous Every 8 hours 07/30/20 0329 08/02/20 0941   07/30/20 1000  metroNIDAZOLE (FLAGYL) IVPB 500 mg  Status:  Discontinued        500 mg 100 mL/hr over 60 Minutes Intravenous Every 8 hours 07/30/20 0408 07/31/20 0816   07/30/20 0415  vancomycin (VANCOREADY) IVPB 1000 mg/200 mL  Status:  Discontinued        1,000 mg 200 mL/hr over 60 Minutes Intravenous  Once 07/30/20 0408 07/30/20 0410   07/30/20 0329  vancomycin variable dose per unstable renal function (pharmacist dosing)  Status:  Discontinued         Does not apply See admin instructions 07/30/20 0329 07/31/20 0757   07/30/20 0115  vancomycin (VANCOREADY) IVPB 2000 mg/400 mL        2,000 mg 200 mL/hr over 120 Minutes Intravenous  Once 07/30/20 0103 07/30/20 0534   07/30/20 0100  aztreonam (AZACTAM) 2 g in sodium chloride 0.9 % 100 mL IVPB        2 g 200 mL/hr over 30 Minutes Intravenous  Once 07/30/20 0059 07/30/20 0214   07/30/20 0100  metroNIDAZOLE (FLAGYL) IVPB 500 mg        500 mg 100 mL/hr over 60 Minutes Intravenous  Once 07/30/20 0059 07/30/20 0341   07/30/20 0100  vancomycin (VANCOREADY) IVPB 1000 mg/200 mL  Status:  Discontinued        1,000 mg 200 mL/hr over 60 Minutes Intravenous  Once 07/30/20 0059 07/30/20 0103       Time spent: 15- minutes-Greater than 50% of this time was spent in counseling, explanation of diagnosis, planning of further management, and coordination of care.  MEDICATIONS: Scheduled Meds: . antiseptic oral rinse  15 mL Topical BID  . LORazepam  0.5 mg Oral BID   Continuous Infusions:  PRN Meds:.acetaminophen **OR** acetaminophen, docusate sodium, glycopyrrolate **OR** glycopyrrolate **OR** glycopyrrolate, haloperidol **OR** haloperidol **OR** haloperidol lactate, LORazepam **OR** LORazepam **OR** LORazepam, morphine injection, ondansetron **OR** ondansetron (ZOFRAN) IV, polyethylene glycol, polyvinyl  alcohol   PHYSICAL EXAM: Vital signs: Vitals:   08/07/20 1844 08/08/20 0528 08/08/20 0602 08/09/20 0511  BP: (!) 147/73 140/70 133/70 128/73  Pulse: 81 75 77 77  Resp: 16 18 17 17   Temp: (!) 97.4 F (36.3 C) 97.9 F (36.6 C) 97.8 F (36.6 C) 98.7 F (37.1 C)  TempSrc: Oral  Axillary Oral  SpO2: 98% 97% 98% 97%  Weight:      Height:       Filed Weights   08/04/20 0517 08/05/20 0603 08/06/20 0643  Weight: 100.2 kg 100.2 kg 100.1 kg   Body mass index is 32.59 kg/m.   Gen Exam: Mildly confused-not in any distress HEENT:atraumatic, normocephalic Chest: B/L clear to auscultation anteriorly CVS:S1S2 regular Abdomen:soft non tender, non distended Extremities:no edema Neurology: Non focal Skin: no rash  I have personally reviewed following labs and imaging studies  LABORATORY DATA: CBC: Recent Labs  Lab 08/03/20 0011 08/04/20 0359 08/05/20 0633 08/06/20 0226  WBC 5.6 6.4 6.8 6.2  HGB 11.1* 11.0* 10.6* 9.9*  HCT 34.9* 36.0 34.1* 31.9*  MCV 87.9 90.0 90.5 91.4  PLT 71* 143* 167 182    Basic Metabolic Panel: Recent Labs  Lab 08/03/20 0011 08/04/20 0359 08/05/20 0633 08/06/20 0226  NA 143  143 146* 141  --   K 4.1  4.1 4.5 4.9  --   CL 114*  115* 115* 111  --   CO2 22  23 25 25   --   GLUCOSE 146*  151* 177* 186*  --   BUN 19  21 17 13   --   CREATININE 0.84  0.79 0.73 0.70  --   CALCIUM 8.2*  8.1* 8.5* 8.5*  --   MG 1.6*  1.6* 2.3  --   --   PHOS 1.9* 1.6* 2.4* 2.6    GFR: Estimated Creatinine Clearance: 83.6 mL/min (by C-G formula based on SCr of 0.7 mg/dL).  Liver Function Tests: Recent Labs  Lab 08/03/20 0011  ALBUMIN 2.2*   No results for input(s): LIPASE, AMYLASE in the last 168 hours. No results for input(s): AMMONIA in the last 168 hours.  Coagulation Profile: No results for input(s): INR, PROTIME in the last 168 hours.  Cardiac Enzymes: No results for input(s): CKTOTAL, CKMB, CKMBINDEX, TROPONINI in the last 168 hours.  BNP  (last 3 results) No results for input(s): PROBNP in the last 8760 hours.  Lipid Profile: No results for input(s): CHOL, HDL, LDLCALC, TRIG, CHOLHDL, LDLDIRECT in the last 72 hours.  Thyroid Function Tests: No results for input(s): TSH, T4TOTAL, FREET4, T3FREE, THYROIDAB in the last 72 hours.  Anemia Panel: No results for input(s): VITAMINB12, FOLATE, FERRITIN, TIBC, IRON, RETICCTPCT in the last 72 hours.  Urine analysis:    Component Value Date/Time   COLORURINE AMBER (A) 07/30/2020 0214   APPEARANCEUR TURBID (A) 07/30/2020 0214  LABSPEC 1.017 07/30/2020 0214   PHURINE 5.0 07/30/2020 0214   GLUCOSEU 50 (A) 07/30/2020 0214   HGBUR MODERATE (A) 07/30/2020 0214   BILIRUBINUR NEGATIVE 07/30/2020 0214   KETONESUR 5 (A) 07/30/2020 0214   PROTEINUR 100 (A) 07/30/2020 0214   NITRITE NEGATIVE 07/30/2020 0214   LEUKOCYTESUR MODERATE (A) 07/30/2020 0214    Sepsis Labs: Lactic Acid, Venous    Component Value Date/Time   LATICACIDVEN 2.1 (HH) 07/31/2020 0924    MICROBIOLOGY: Recent Results (from the past 240 hour(s))  MRSA PCR Screening     Status: None   Collection Time: 07/30/20  3:31 PM   Specimen: Nasopharyngeal  Result Value Ref Range Status   MRSA by PCR NEGATIVE NEGATIVE Final    Comment:        The GeneXpert MRSA Assay (FDA approved for NASAL specimens only), is one component of a comprehensive MRSA colonization surveillance program. It is not intended to diagnose MRSA infection nor to guide or monitor treatment for MRSA infections. Performed at [facility], [address], [place], [place] [address]     RADIOLOGY STUDIES/RESULTS: No results found.   LOS: 10 days   Jeoffrey Massed, MD  [facility]    To contact the attending provider between 7A-7P or the covering provider during after hours 7P-7A, please log into the web site [email or web address] and access using universal [place] password for that web site. If you do not have the password,  please call the hospital operator.  08/09/2020, 8:11 AM

## 2020-08-09 NOTE — Progress Notes (Signed)
Patient still refused dressing change but sacral foam and foam at left heel are still clean, dry and intact when patient was repositioned on bed.

## 2020-08-09 NOTE — Progress Notes (Signed)
Daily Progress Note   Patient Name: Kristy Jensen       Date: 08/09/2020 DOB: 1950-11-01  Age: 70 y.o. MRN#: [id number] Attending Physician: Jonetta Osgood, MD Primary Care Physician: Patient, No Pcp Per (Inactive) Admit Date: 07/30/2020  Reason for Consultation/Follow-up: Disposition, Non pain symptom management, Pain control, Psychosocial/spiritual support and Terminal Care  Subjective: Received update this morning that [facility] did not accept patient yesterday but plan was for patient's discharge to [facility] with hospice. Called to speak with son/[name]. Therapeutic listening provided as he reflects frustration over "time line," as he was originally told patient likely had <2 weeks. Validated feelings of frustration and discussed that natural trajectory at EOL is not always a straight decline - patient may have good/bad days - he expressed understanding. Dian Situ expresses that he would prefer patient discharge to [facility], but confirms that she also has a bed at [facility] ALF (however will be paying out of pocket if returns to ALF). He asks if patient can be reassessed by [facility] again today. Explained I would be completing an assessment of the patient as well as reach out to [facility] liaisons and notify them of his request.   Chart review performed. Received report from primary RN - no acute concerns. RN reports patient is awake, oriented to self, and took 2 bites of applesauce and 3 sips of soda this morning.   Went to visit patient at bedside - no family/visitors present. Patient was lying in bed awake, alert, oriented x3 (self, place, time, not situation) but not able to participate in complex medical discussions. Patient states she "feels good" today. She denies having an appetite, when asked  she said "I'm not hungry" and "I eat a little of this and a little of that." We discussed her discharge back to [place] - she states she remembers this place and "had fun" there. Ms. Harsha denies pain or other concerns. No signs or non-verbal gestures of pain or discomfort noted. No respiratory distress, increased work of breathing, or secretions noted.   Met and spoke with Chrislyn/hospice liaison at bedside, as well as spoke with Jeannetta Nap LCSW about disposition.   Called [name] back to provide updates. Reviewed that per my assessment, patient likely has 4-6 week prognosis. Updated that Travilah also still feels patient's prognosis is greater than 2  weeks. I offered to have patient evaluated at [facility] as they sometimes accept patients with prognosis of <6 weeks. Dian Situ kindly declined this option as [place] is too far away from family. We discussed discharge back to [facility] with hospice, which is his preference at this time if patient cannot be accepted at [facility]. We discussed that pending [facility] Nursing Director availability, patient may discharge this weekend vs Monday - he expressed understanding. Explained patient would remain in house under full comfort care until safe disposition to [facility] was in place per his questions. Education provided that once patient reaches a life expectancy of <2 weeks, patient would have the option to transfer from [place] to [facility] if that was their desire vs remain at [facility].   All questions and concerns addressed. Encouraged to call with questions and/or concerns. PMT card previously provided.  Spoke with Miranda LCSW to update that patient would prefer patient discharge to [facility] with hospice over sending referral to [facility].   Length of Stay: 10  Current Medications: Scheduled Meds:  . antiseptic oral rinse  15 mL Topical BID  . LORazepam  0.5 mg Oral BID    Continuous Infusions:   PRN Meds: acetaminophen **OR**  acetaminophen, docusate sodium, glycopyrrolate **OR** glycopyrrolate **OR** glycopyrrolate, haloperidol **OR** haloperidol **OR** haloperidol lactate, LORazepam **OR** LORazepam **OR** LORazepam, morphine injection, ondansetron **OR** ondansetron (ZOFRAN) IV, polyethylene glycol, polyvinyl alcohol  Physical Exam Vitals and nursing note reviewed.  Constitutional:      General: She is not in acute distress.    Appearance: She is ill-appearing.  Pulmonary:     Effort: No respiratory distress.  Skin:    General: Skin is warm and dry.  Neurological:     Mental Status: She is alert. She is disoriented.     Motor: Weakness present.  Psychiatric:        Attention and Perception: Attention normal.        Behavior: Behavior is cooperative.        Cognition and Memory: Cognition and memory normal.     Comments: Confused at times             Vital Signs: BP 128/73 (BP Location: Left Arm)   Pulse 77   Temp 98.7 F (37.1 C) (Oral)   Resp 17   Ht '5\' 9"'  (1.753 m)   Wt 100.1 kg   SpO2 97%   BMI 32.59 kg/m  SpO2: SpO2: 97 % O2 Device: O2 Device: Room Air O2 Flow Rate: O2 Flow Rate (L/min): 2 L/min  Intake/output summary:   Intake/Output Summary (Last 24 hours) at 08/09/2020 1145 Last data filed at 08/09/2020 6553 Gross per 24 hour  Intake 540 ml  Output 900 ml  Net -360 ml   LBM: Last BM Date: 08/07/20 Baseline Weight: Weight: 99.8 kg Most recent weight: Weight: 100.1 kg       Palliative Assessment/Data: PPS 20-30%      Patient Active Problem List   Diagnosis Date Noted  . Hypomagnesemia 08/03/2020  . SVT (supraventricular tachycardia) ([place]) 08/03/2020  . Malnutrition of moderate degree 08/01/2020  . Atrial fibrillation ([place])   . Sepsis ([place]) 07/30/2020  . ARF (acute renal failure) ([place]) 07/30/2020  . Shock ([place]) 07/30/2020  . Pressure injury of skin 07/30/2020  . Altered mental status 06/14/2020  . Metabolic encephalopathy 74/82/7078  . CVA (cerebral vascular accident)  ([place]) 06/13/2020  . Acute lower UTI 06/13/2020  . Hyperglycemia 06/13/2020    Palliative Care Assessment &  Plan   Patient Profile: 69 y.o.femalewith past medical history of CVA, UTIs, Afib with SVT recently admitted and discharge February with UTI and AMS-admitted on 3/30/2022with alterend mental status, a fib and RVR.Additionally with acute kidney failure, hyperkalemia, hypernatremia. Admission complicated by sepsis from UTI and MRI on 3/30 indicating increased size in acute CVA. SLP eval indicates difficulty following commands, unable to demonstrate volitional swallow. Palliative medicine consulted [place].  Assessment: Septic shock secondary to complicated UTI Acute metabolic encephalopathy PAF with RVR-SVT Dysphasia Recent CVA  Recommendations/Plan: Continue full comfort measures Continue DNR/DNI as previously documented Son would like patient discharged to ALF, [facility] with hospice - per [facility] discharge most likely will be Monday Continue current comfort focused regimen - patient appears comfortable PMT will continue to follow and support holistically  Goals of Care and Additional Recommendations: Limitations on Scope of Treatment: Full Comfort Care  Code Status:    Code Status Orders  (From admission, onward)         Start     Ordered   08/06/20 1759  Do not attempt resuscitation (DNR)  Continuous       Question Answer Comment  In the event of cardiac or respiratory ARREST Do not call a "code blue"   In the event of cardiac or respiratory ARREST Do not perform Intubation, CPR, defibrillation or ACLS   In the event of cardiac or respiratory ARREST Use medication by any route, position, wound care, and other measures to relive pain and suffering. May use oxygen, suction and manual treatment of airway obstruction as needed for comfort.      08/06/20 1805        Code Status History    Date Active Date Inactive Code Status Order ID Comments User Context   08/02/2020  1711 08/06/2020 1805 DNR [id number]  Earlie Counts, NP Inpatient   07/30/2020 0841 08/02/2020 1711 Full Code [id number]  Minor, Grace Bushy, NP ED   07/30/2020 0408 07/30/2020 0840 Full Code [id number]  Rise Patience, MD ED   06/13/2020 2226 06/19/2020 2036 Full Code [id number]  Elwyn Reach, MD ED   06/13/2020 2020 06/13/2020 2226 Full Code [id number]  Elwyn Reach, MD ED   Advance Care Planning Activity      Prognosis:  < 6 weeks  Discharge Planning: [place] with Hospice  Care plan was discussed with primary RN, TOC, hospice liaison, Dr. Sloan Leiter, patient's son  Thank you for allowing the Palliative Medicine Team to assist in the care of this patient.   Total Time 70 minutes Prolonged Time Billed  yes       Greater than 50%  of this time was spent counseling and coordinating care related to the above assessment and plan.  Lin Landsman, NP  Please contact Palliative Medicine Team phone at [phone] for questions and concerns.

## 2020-08-09 NOTE — TOC Progression Note (Signed)
Transition of Care [facility]) - Progression Note    Patient Details  Name: Kristy Jensen MRN: [id number] Date of Birth: 18-Jan-1951  Transition of Care [facility]) CM/SW Contact  Jimmy Picket, [place] Phone Number: 08/09/2020, 11:32 AM  Clinical Narrative:     CSW spoke to pts son [name] via phone. CSW informed [name] that [facility] is not yet [employer] appropriate according to [employer]. [name] was displeased with that determination. [name] stated that he does not want her to return to [place] and she was unable to return to her apartment and is not able to go to his home. [name] stated that he would prefer pt to stay in hospital until pt is [facility] appropriate and if that is not possible he would want pt to return to [facility] with hospice support and they would pay for additional care.  CSW referred Drews concerns to the MD and [facility] place.   CSW called Cathlean Sauer and was told pt is in Independent living and does not have clinical team. CSW called [name] and [name] stated that pt was moved to assisted living to room 216.   Pt is currently being re-evaluated by palliative team and [facility] place.   CSW to follow.    Expected Discharge Plan:  (TBD) Barriers to Discharge: Continued Medical Work up,Other (comment) (identification of goals of care)  Expected Discharge Plan and Services Expected Discharge Plan:  (TBD)     Post Acute Care Choice:  (TBD) Living arrangements for the past 2 months: Skilled Nursing Facility [facility]) Expected Discharge Date: 08/09/20                                     Social Determinants of Health (SDOH) Interventions    Readmission Risk Interventions No flowsheet data found.  Jimmy Picket, Theresia Majors, [place] Clinical Social Worker [phone]

## 2020-08-09 NOTE — Progress Notes (Signed)
Patient refused wound care at this time. Will try again early morning.

## 2020-08-09 NOTE — Discharge Summary (Signed)
PATIENT DETAILS Name: Kristy Jensen Age: 70 y.o. Sex: female Date of Birth: 12-25-1950 MRN: [id number]. Admitting Physician: Steffanie Dunn, DO NWG:[id number], No Pcp Per (Inactive)  Admit Date: 07/30/2020 Discharge date: 08/09/2020  Recommendations for Outpatient Follow-up:  1. Optimize comfort cares 2. Ensure hospice follow-up at ALF  Admitted From:  ALF   Disposition: ALF with hospice follow-up   Home Health: No  Equipment/Devices: None  Discharge Condition: Stable  CODE STATUS:  DNR  Diet recommendation:  Diet Order            Diet - low sodium heart healthy           Diet regular Room service appropriate? Yes; Fluid consistency: Thin  Diet effective now                  Brief Narrative: Patient is a 70 y.o. female with history of CVA, PAF, HLD, HTN presenting to the hospital with altered mental status, A. fib with RVR, hyperkalemia and septic shock.  Significant events: 3/30>> admit to ICU 4/2>> transferred to [facility] 4/4>> dysphagia 2 diet started 4/6>> cortrack tube removed 4/6>> transitioned to comfort measures.  Significant studies: 2/13>> Echo: EF 55-60%, grade 1 diastolic dysfunction. 3/30>> CT head: No acute intracranial abnormality. 3/30>> CT renal stone study: Bibasilar pulmonary infiltrates, no acute [place] abdominal pathology. 3/30>> chest x-ray: Stable bibasilar pulmonary fibrotic changes. 3/30>> MRI brain: Increased size of acute to subacute infarcts of the right basal ganglia.   Antimicrobial therapy: Aztreonam: 3/29>>4/3 Flagyl: 3/29>> 3/30 Vancomycin: 3/29 x 1  Microbiology data: 3/30>> urine culture:<10,000 colonies/mL 3/30>> 1/2 blood culture: Staphylococcus capitis-likely contaminant  Procedures : None  Consults: CCM, neurology, palliative care   Brief Hospital Course: Septic shock due to complicated UTI: Sepsis physiology has improved-culture data as above-although urine cultures essentially negative-being  treated as complicated UTI.  Has completed a course of aztreonam.  Acute metabolic encephalopathy: Felt to be due to AKI/sepsis-improving-minimally confused today-answering simple questions.  Continue supportive care.  Evaluated by neurology-although MRI brain showed increased size of the subacute infarct-she is not felt to have acute CVA clinically.  All antiplatelet agents discontinued-as patient transitioned to full comfort measures.  PAF with RVR-SVT: Evaluated by cardiology during this hospital stay-however all medication/anticoagulation discontinued as patient transitioned to full comfort measures   AKI: Likely hemodynamically mediated-resolved.  Hypokalemia/hypomagnesemia/hypophosphatemia: Repleted.  Hypernatremia: Resolved  Normocytic anemia: Due to acute illness-no evidence of blood loss-monitor  Known right thyroid goiter with 1.7 cm nodule: TSH within normal limit-plans were for outpatient FNA-however since transitioned to comfort measures-doubt further work-up is required.    Recent CVA February 2022: No longer on anticoagulation once transitioned to full comfort measures.  Dysphagia: Probably chronic-worsened by encephalopathy/acute illness-did require NG tube feedings-evaluated by SLP on 4/4-dysphagia 2 diet started-tolerating diet-however continues to have very poor oral intake-due to very poor appetite  Goals of care: Frail-recurrent hospitalizations-severe dysphagia-debilitated-DNR-palliative care meeting with family on 4/6.  Per my discussion with patient's son on 4/5-patient had significant decline in function over the past several months-very poor oral intake even prior to this hospitalization.  Due to dysphagia-she was on NG tube feedings-however once oral intake was started-she continued to exhibit very poor oral intake.  Extensive discussion with family by this MD-and subsequently by the palliative care team-on 4/6-patient was transitioned to full comfort measures  with plans to transition to residential hospice on discharge-however per hospice-patient may not qualify from residential hospice (see notes).  Palliative care has discussed with family  on 4/9-being discharged back to ALF with hospice care.  Nutrition Problem: Nutrition Problem: Moderate Malnutrition Etiology: chronic illness Signs/Symptoms: mild fat depletion,moderate muscle depletion,percent weight loss (15.9% weight loss since 06/17/20) Percent weight loss: 15.9 % Interventions: Tube feeding  Obesity: Estimated body mass index is 32.59 kg/m as calculated from the following:   Height as of this encounter: 5\' 9"  (1.753 m).   Weight as of this encounter: 100.1 kg.    RN pressure injury documentation: Pressure Injury 07/30/20 Sacrum Mid Stage 2 -  Partial thickness loss of dermis presenting as a shallow open injury with a red, pink wound bed without slough. linear open area on sacrum (Active)  07/30/20 1530  Location: Sacrum  Location Orientation: Mid  Staging: Stage 2 -  Partial thickness loss of dermis presenting as a shallow open injury with a red, pink wound bed without slough.  Wound Description (Comments): linear open area on sacrum  Present on Admission: Yes     Pressure Injury 07/30/20 Buttocks Left;Medial Stage 2 -  Partial thickness loss of dermis presenting as a shallow open injury with a red, pink wound bed without slough. small opening to inner buttock (Active)  07/30/20 1530  Location: Buttocks  Location Orientation: Left;Medial  Staging: Stage 2 -  Partial thickness loss of dermis presenting as a shallow open injury with a red, pink wound bed without slough.  Wound Description (Comments): small opening to inner buttock  Present on Admission: Yes     Discharge Diagnoses:  Principal Problem:   Sepsis (HCC) Active Problems:   Metabolic encephalopathy   CVA (cerebral vascular accident) (HCC)   Acute lower UTI   ARF (acute renal failure) (HCC)   Shock (HCC)    Pressure injury of skin   Atrial fibrillation (HCC)   Malnutrition of moderate degree   Hypomagnesemia   SVT (supraventricular tachycardia) (HCC)   Discharge Instructions:  Activity:  As tolerated with Full fall precautions use walker/cane & assistance as needed   Discharge Instructions    Diet - low sodium heart healthy   Complete by: As directed    Discharge wound care:   Complete by: As directed    Wound care to partial thickness open lesions on sacrum and left gluteal cleft (POA): Cleanse with soap and water, rinse and pat dry. Cover with xeroform gauze 08/01/20 # [id number]), top with dry gauze and cover with silicone foam.  Turn patient side to side and minimize time in the supine position.   Increase activity slowly   Complete by: As directed      Allergies as of 08/09/2020      Reactions   Penicillins Swelling, Other (See Comments)   Throat swelling/ turned red   Sulfa Antibiotics Swelling, Other (See Comments)   Throat swelling/turned red      Medication List    STOP taking these medications   atorvastatin 80 MG tablet Commonly known as: LIPITOR   FLUoxetine 10 MG capsule Commonly known as: PROZAC   fosinopril 40 MG tablet Commonly known as: MONOPRIL   metFORMIN 500 MG tablet Commonly known as: Glucophage   metoprolol succinate 25 MG 24 hr tablet Commonly known as: TOPROL-XL     TAKE these medications   LORazepam 0.5 MG tablet Commonly known as: ATIVAN Take 1 tablet (0.5 mg total) by mouth 2 (two) times daily.   LORazepam 1 MG tablet Commonly known as: ATIVAN Take 1 tablet (1 mg total) by mouth every hour as needed for anxiety, seizure, sedation or  sleep.   morphine CONCENTRATE 10 mg / 0.5 ml concentrated solution Take 0.25 mLs (5 mg total) by mouth every 2 (two) hours as needed for severe pain.            Discharge Care Instructions  (From admission, onward)         Start     Ordered   08/09/20 0000  Discharge wound care:       Comments: Wound  care to partial thickness open lesions on sacrum and left gluteal cleft (POA): Cleanse with soap and water, rinse and pat dry. Cover with xeroform gauze [place] # [id number]), top with dry gauze and cover with silicone foam.  Turn patient side to side and minimize time in the supine position.   08/09/20 1123          Follow-up Information    Meriam Sprague, MD. Schedule an appointment as soon as possible for a visit in 1 week(s).   Specialties: Cardiology, Radiology Contact information: [address] [place] [address] [phone]              Allergies  Allergen Reactions  . Penicillins Swelling and Other (See Comments)    Throat swelling/ turned red  . Sulfa Antibiotics Swelling and Other (See Comments)    Throat swelling/turned red      Other Procedures/Studies: CT Head Wo Contrast  Result Date: 07/30/2020 CLINICAL DATA:  Delirium EXAM: CT HEAD WITHOUT CONTRAST TECHNIQUE: Contiguous axial images were obtained from the base of the skull through the vertex without intravenous contrast. COMPARISON:  06/13/2020 FINDINGS: Brain: There is an old posterior left MCA territory infarct. No acute hemorrhage. Mild generalized atrophy with old bilateral small vessel infarcts. Vascular: Atherosclerotic calcification of the internal carotid arteries at the skull base. No abnormal hyperdensity of the major intracranial arteries or dural venous sinuses. Skull: The visualized skull base, calvarium and extracranial soft tissues are normal. Sinuses/Orbits: No fluid levels or advanced mucosal thickening of the visualized paranasal sinuses. No mastoid or middle ear effusion. The orbits are normal. IMPRESSION: 1. No acute intracranial abnormality. 2. Old posterior left MCA territory infarct. Electronically Signed   By: Deatra Robinson M.D.   On: 07/30/2020 01:34   MR BRAIN WO CONTRAST  Result Date: 07/30/2020 CLINICAL DATA:  Encephalopathy EXAM: MRI HEAD WITHOUT CONTRAST  TECHNIQUE: Multiplanar, multiecho pulse sequences of the brain and surrounding structures were obtained without intravenous contrast. COMPARISON:  Brain MRI 06/17/2020 FINDINGS: Brain: Areas of abnormal diffusion restriction in the right basal ganglia are now more confluent. Foci in the infarcted region of the left parietal/temporal lobe are unchanged. Left parietal/temporal encephalomalacia. No acute or chronic hemorrhage. Normal white matter signal, parenchymal volume and CSF spaces. The midline structures are normal. Vascular: Major flow voids are preserved. Skull and upper cervical spine: Normal calvarium and skull base. Visualized upper cervical spine and soft tissues are normal. Sinuses/Orbits:No paranasal sinus fluid levels or advanced mucosal thickening. No mastoid or middle ear effusion. Normal orbits. IMPRESSION: 1. Increased size of acute to subacute infarcts of the right basal ganglia. No hemorrhage or mass effect. 2. Unchanged old left posterior MCA infarct. Electronically Signed   By: Deatra Robinson M.D.   On: 07/30/2020 19:01   DG Chest Port 1 View  Result Date: 07/30/2020 CLINICAL DATA:  Sepsis EXAM: PORTABLE CHEST 1 VIEW COMPARISON:  06/13/2020 FINDINGS: Lung volumes are small, but are symmetric. Bibasilar fibrotic changes are again identified. No superimposed focal pulmonary infiltrate. No pneumothorax or  pleural effusion. Cardiac size is within normal limits. The pulmonary vascularity is normal. No acute bone abnormality. IMPRESSION: Stable bibasilar pulmonary fibrotic changes. No radiographic evidence of acute cardiopulmonary disease. Electronically Signed   By: Helyn NumbersAshesh  Parikh MD   On: 07/30/2020 01:14   CT RENAL STONE STUDY  Result Date: 07/30/2020 CLINICAL DATA:  Flank pain, sepsis, urinary tract infection EXAM: CT ABDOMEN AND PELVIS WITHOUT CONTRAST TECHNIQUE: Multidetector CT imaging of the abdomen and pelvis was performed following the standard protocol without IV contrast.  COMPARISON:  None. FINDINGS: Lower chest: Bibasilar pulmonary infiltrates are present, not well assessed on this examination. Mild coronary artery calcification. Global cardiac size within normal limits. No pericardial effusion. Hepatobiliary: No focal liver abnormality is seen. Status post cholecystectomy. No biliary dilatation. Pancreas: Unremarkable Spleen: Unremarkable Adrenals/Urinary Tract: Adrenal glands are unremarkable. Kidneys are normal, without renal calculi, focal lesion, or hydronephrosis. Bladder is unremarkable. Stomach/Bowel: Moderate descending and sigmoid colonic diverticulosis. The stomach, small bowel, and large bowel are otherwise unremarkable. Appendix normal. No free intraperitoneal gas or fluid. Vascular/Lymphatic: Moderate aortoiliac atherosclerotic calcification. No aortic aneurysm. No pathologic adenopathy within the abdomen and pelvis. Reproductive: Uterus and bilateral adnexa are unremarkable. Other: Tiny fat containing umbilical hernia.  Rectum unremarkable. Musculoskeletal: No acute bone abnormality. Degenerative changes are seen within the lumbar spine. No lytic or blastic bone lesions. IMPRESSION: Bibasilar pulmonary infiltrates, not well assessed on this examination. While associated with volume loss, these do not have the typical appearance of pulmonary fibrosis and additional considerations should include interstitial lung disease, as can be seen with connective tissue disorders or drug reaction. No acute intra-abdominal pathology. No definite radiographic explanation for the patient's reported symptoms. Moderate distal colonic diverticulosis. Aortic Atherosclerosis (ICD10-I70.0). Electronically Signed   By: Helyn NumbersAshesh  Parikh MD   On: 07/30/2020 06:14     TODAY-DAY OF DISCHARGE:  Subjective:   Kristy GandyVirginia Jensen today has no headache,no chest abdominal pain,no new weakness tingling or numbness, feels much better wants to go home today.   Objective:   Blood pressure 128/73,  pulse 77, temperature 98.7 F (37.1 C), temperature source Oral, resp. rate 17, height 5\' 9"  (1.753 m), weight 100.1 kg, SpO2 97 %.  Intake/Output Summary (Last 24 hours) at 08/09/2020 1124 Last data filed at 08/09/2020 0512 Gross per 24 hour  Intake 540 ml  Output 900 ml  Net -360 ml   Filed Weights   08/04/20 0517 08/05/20 0603 08/06/20 0643  Weight: 100.2 kg 100.2 kg 100.1 kg    Exam: Awake Alert, Oriented *3, No new F.N deficits, Normal affect [place].AT,PERRAL Supple Neck,No JVD, No cervical lymphadenopathy appriciated.  Symmetrical Chest wall movement, Good air movement bilaterally, CTAB RRR,No Gallops,Rubs or new Murmurs, No Parasternal Heave +ve B.Sounds, Abd Soft, Non tender, No organomegaly appriciated, No rebound -guarding or rigidity. No Cyanosis, Clubbing or edema, No new Rash or bruise   PERTINENT RADIOLOGIC STUDIES: No results found.   PERTINENT LAB RESULTS: CBC: No results for input(s): WBC, HGB, HCT, PLT in the last 72 hours. CMET CMP     Component Value Date/Time   NA 141 08/05/2020 0633   K 4.9 08/05/2020 0633   CL 111 08/05/2020 0633   CO2 25 08/05/2020 0633   GLUCOSE 186 (H) 08/05/2020 0633   BUN 13 08/05/2020 0633   CREATININE 0.70 08/05/2020 0633   CALCIUM 8.5 (L) 08/05/2020 0633   PROT 5.8 (L) 07/30/2020 0517   ALBUMIN 2.2 (L) 08/03/2020 0011   AST 17 07/30/2020 0517   ALT 6  07/30/2020 0517   ALKPHOS 65 07/30/2020 0517   BILITOT 0.8 07/30/2020 0517   GFRNONAA >60 08/05/2020 0633    GFR Estimated Creatinine Clearance: 83.6 mL/min (by C-G formula based on SCr of 0.7 mg/dL). No results for input(s): LIPASE, AMYLASE in the last 72 hours. No results for input(s): CKTOTAL, CKMB, CKMBINDEX, TROPONINI in the last 72 hours. Invalid input(s): POCBNP No results for input(s): DDIMER in the last 72 hours. No results for input(s): HGBA1C in the last 72 hours. No results for input(s): CHOL, HDL, LDLCALC, TRIG, CHOLHDL, LDLDIRECT in the last 72 hours. No  results for input(s): TSH, T4TOTAL, T3FREE, THYROIDAB in the last 72 hours.  Invalid input(s): FREET3 No results for input(s): VITAMINB12, FOLATE, FERRITIN, TIBC, IRON, RETICCTPCT in the last 72 hours. Coags: No results for input(s): INR in the last 72 hours.  Invalid input(s): PT Microbiology: Recent Results (from the past 240 hour(s))  MRSA PCR Screening     Status: None   Collection Time: 07/30/20  3:31 PM   Specimen: Nasopharyngeal  Result Value Ref Range Status   MRSA by PCR NEGATIVE NEGATIVE Final    Comment:        The GeneXpert MRSA Assay (FDA approved for NASAL specimens only), is one component of a comprehensive MRSA colonization surveillance program. It is not intended to diagnose MRSA infection nor to guide or monitor treatment for MRSA infections. Performed at [facility], [address] [place], [place] [address]     FURTHER DISCHARGE INSTRUCTIONS:  Get Medicines reviewed and adjusted: Please take all your medications with you for your next visit with your Primary MD  Laboratory/radiological data: Please request your Primary MD to go over all hospital tests and procedure/radiological results at the follow up, please ask your Primary MD to get all Hospital records sent to his/her office.  In some cases, they will be blood work, cultures and biopsy results pending at the time of your discharge. Please request that your primary care M.D. goes through all the records of your hospital data and follows up on these results.  Also Note the following: If you experience worsening of your admission symptoms, develop shortness of breath, life threatening emergency, suicidal or homicidal thoughts you must seek medical attention immediately by calling 911 or calling your MD immediately  if symptoms less severe.  You must read complete instructions/literature along with all the possible adverse reactions/side effects for all the Medicines you take and that have been  prescribed to you. Take any new Medicines after you have completely understood and accpet all the possible adverse reactions/side effects.   Do not drive when taking Pain medications or sleeping medications (Benzodaizepines)  Do not take more than prescribed Pain, Sleep and Anxiety Medications. It is not advisable to combine anxiety,sleep and pain medications without talking with your primary care practitioner  Special Instructions: If you have smoked or chewed Tobacco  in the last 2 yrs please stop smoking, stop any regular Alcohol  and or any Recreational drug use.  Wear Seat belts while driving.  Please note: You were cared for by a hospitalist during your hospital stay. Once you are discharged, your primary care physician will handle any further medical issues. Please note that NO REFILLS for any discharge medications will be authorized once you are discharged, as it is imperative that you return to your primary care physician (or establish a relationship with a primary care physician if you do not have one) for your post hospital discharge  needs so that they can reassess your need for medications and monitor your lab values.  Total Time spent coordinating discharge including counseling, education and face to face time equals 35  minutes.  SignedJeoffrey Massed 08/09/2020 11:24 AM

## 2020-08-10 DIAGNOSIS — A021 Salmonella sepsis: Secondary | ICD-10-CM | POA: Diagnosis not present

## 2020-08-10 DIAGNOSIS — R6520 Severe sepsis without septic shock: Secondary | ICD-10-CM | POA: Diagnosis not present

## 2020-08-10 DIAGNOSIS — G6281 Critical illness polyneuropathy: Secondary | ICD-10-CM | POA: Diagnosis not present

## 2020-08-10 NOTE — TOC Progression Note (Addendum)
Transition of Care [facility]) - Progression Note    Patient Details  Name: Kristy Jensen MRN: [id number] Date of Birth: 07/21/1950  Transition of [facility]) CM/SW Contact  Levada Schilling Phone Number: 08/10/2020, 3:48 PM  Clinical Narrative:    Pt will need covid test today for possible discharge on Monday.  CSW sent physician secure message for covid test for today and RN was update.  TO will continue to assist with disposition planning.   Expected Discharge Plan:  (TBD) Barriers to Discharge: Continued Medical Work up,Other (comment) (identification of goals of care)  Expected Discharge Plan and Services Expected Discharge Plan:  (TBD)     Post Acute Care Choice:  (TBD) Living arrangements for the past 2 months: Skilled Nursing Facility [facility]) Expected Discharge Date: 08/09/20                                     Social Determinants of Health (SDOH) Interventions    Readmission Risk Interventions No flowsheet data found.

## 2020-08-10 NOTE — Progress Notes (Signed)
[facility] PROGRESS NOTE  Patient: [place] ZOX:[id number]   PCP: Patient, No Pcp Per (Inactive) DOB: 05/17/1950   DOA: 07/30/2020   DOS: 08/10/2020    Subjective: No acute complaint.  No nausea no vomiting but no fever no chills.  No abdominal pain.  Minimal oral intake.  Objective:  Vitals:   08/10/20 0424 08/10/20 0956  BP: (!) 146/72 138/73  Pulse: 73 74  Resp: 15 16  Temp: 98.7 F (37.1 C) 98.4 F (36.9 C)  SpO2: 98% 96%    S1-S2 present. Bowel sound present. Chest clear to auscultation Bilateral trace edema.  Assessment and plan: Sepsis physiology resolved. Acute metabolic encephalopathy resolved. Diarrhea improving. Currently on comfort protocol and ready to be discharged to ALF with hospice care. Covid test ordered to facilitate discharge.  Author: Lynden Oxford, MD [facility] 08/10/2020 7:27 PM   If 7PM-7AM, please contact night-coverage at [email or web address]

## 2020-08-11 LAB — SARS CORONAVIRUS 2 (TAT 6-24 HRS): SARS Coronavirus 2: NEGATIVE

## 2020-08-11 NOTE — Progress Notes (Addendum)
[facility])  Ms. Lau has been referred by [facility] for IPU, at this time she is deemed not eligible but has been approved for hospice at discharge.   Spoke to son [name] who is a bit frustrated on disposition plans and some concerns about EOL expectancy. I have educated on hospice and [name] wants his mother to discharge asap to [facility] with [facility] hospice services to follow.[employer] and they are agreeable to patient returning to the assisted living side. DME has been ordered: Hospital bed, over the bed table, 3n1 and wheelchair.   [facility] will continue to follow this patient through disposition and hospice at discharge.   Thank you for the opportunity to care for this patient.   Yolande Jolly, BSN, [employer] [phone]

## 2020-08-11 NOTE — TOC Progression Note (Signed)
Transition of Care [facility]) - Progression Note    Patient Details  Name: Kristy Jensen MRN: [id number] Date of Birth: 02-13-1951  Transition [facility]) CM/SW Contact  Carley Hammed, [place] Phone Number: 08/11/2020, 10:29 AM  Clinical Narrative:    CSW spoke with nurse at [facility] who noted she was aware of pt and the plans to DC her back. She took a message for the nursing director who will call for any additional information when she gets out of her meeting. CSW will continue to follow for DC planning.   Expected Discharge Plan:  (TBD) Barriers to Discharge: Continued Medical Work up,Other (comment) (identification of goals of care)  Expected Discharge Plan and Services Expected Discharge Plan:  (TBD)     Post Acute Care Choice:  (TBD) Living arrangements for the past 2 months: Skilled Nursing Facility [facility]) Expected Discharge Date: 08/09/20                                     Social Determinants of Health (SDOH) Interventions    Readmission Risk Interventions No flowsheet data found.

## 2020-08-11 NOTE — Discharge Summary (Signed)
[facility] Discharge Summary   Patient: Kristy Jensen CZY:[id number]  PCP: Patient, No Pcp Per (Inactive)  Date of admission: 07/30/2020   Date of discharge:  08/11/2020     Discharge Diagnoses:  Principal Problem:   Sepsis (HCC) Active Problems:   Metabolic encephalopathy   CVA (cerebral vascular accident) (HCC)   Acute lower UTI   ARF (acute renal failure) (HCC)   Shock (HCC)   Pressure injury of skin   Atrial fibrillation (HCC)   Malnutrition of moderate degree   Hypomagnesemia   SVT (supraventricular tachycardia) (HCC)  Admitted From: SNF Disposition:  ALF/ILF with hospice  Recommendations for Outpatient Follow-up:  1. PCP: establish care with hospice 2. Follow up LABS/TEST:  none   Follow-up Information    Meriam Sprague, MD. Schedule an appointment as soon as possible for a visit in 1 week(s).   Specialties: Cardiology, Radiology Contact information: [address] [place] [address] [phone]              Discharge Instructions    Diet - low sodium heart healthy   Complete by: As directed    Discharge wound care:   Complete by: As directed    Wound care to partial thickness open lesions on sacrum and left gluteal cleft (POA): Cleanse with soap and water, rinse and pat dry. Cover with xeroform gauze [place] # [id number]), top with dry gauze and cover with silicone foam.  Turn patient side to side and minimize time in the supine position.   Increase activity slowly   Complete by: As directed      Diet recommendation: Regular diet  Activity: The patient is advised to gradually reintroduce usual activities, as tolerated  Discharge Condition: stable  Code Status: DNR   History of present illness: As per the H and P dictated on admission, "[place] is a 70 y.o. female with history of CVA, hypertension, hyperglycemia, tachycardia likely from an SVT versus atrial flutter admitted last month for stroke at that time patient  also was found to be encephalopathic likely from stroke and UTI had brief runs of tachycardia cardiology was consulted at that time concern was for an SVT versus atrial flutter was brought to the ER after patient was found to have a change in mental status.  EMS on arrival found patient to be hypotensive.  No further history is available at this time as patient is confused and I was not able to reach patient's son.  ED Course: In the ER patient was initially hypotensive was given fluid bolus blood cultures urine cultures obtained started on empiric antibiotics for sepsis likely from UTI since UA is concerning for UTI.  CT head was unremarkable.  On my exam patient is still encephalopathic.  Labs show acute renal failure with creatinine worsening from June 19, 2020 last month was normal at 0.8 is around 3.9.  Sodium 145 potassium 5.5 lactic acid was 2.4 worsened to 3.9 further fluid bolus have been ordered WBC count 19.8.  In the ER patient went into A. fib with RVR.  I am ordering metoprolol IV since patient blood pressure has improved at this time with fluids.  Covid test is negative chest x-ray shows chronic changes."  Hospital Course:  Summary of her active problems in the hospital is as following.   Septic shock due to complicated UTI:  Sepsis physiology has resolved culture data inconclusive  being treated as complicated UTI. Has completed a course of aztreonam.  Acute metabolic  encephalopathy: resolved  Felt to be due to AKI/sepsis Improving Continue supportive care.  Evaluated by neurology MRI brain showed increased size of the subacute infarct Per neuro, she is not felt to have acute CVA clinically.  All antiplatelet agents discontinued as patient transitioned to full comfort measures.  PAF with RVR-SVT: Evaluated by cardiology during this hospital stay Was on metoprolol and heparin drip with plans to switch to heparin however all medication/anticoagulation discontinued as  patient transitioned to full comfort measures   ZOX:WRUEAV hemodynamically mediated-resolved.  Hypokalemia/hypomagnesemia/hypophosphatemia: Repleted.  Hypernatremia:Resolved  Normocytic anemia:Due to acute illness-no evidence of blood loss-monitor  Known right thyroid goiter with 1.7 cm nodule:TSH within normal limit-plans were for outpatient FNA-however since transitioned to comfort measures-doubt further work-up is required.   Recent CVA February 2022: N Neuro consulted  Recommended anticoagulation with Eliquis  No longer on anticoagulation once transitioned to full comfort measures.  Dysphagia:  Probably chronic-worsened by encephalopathy/acute illness.  require NG tube feedings evaluated by SLP on 4/4-dysphagia 2 diet started-tolerating continues to have very poor oral intake  Goals of care: Frail-recurrent hospitalizations-severe dysphagia-debilitated-DNR-palliative care meeting with family on 4/6. Per discussion with patient's son on 4/5-patient had significant decline in function over the past several months-very poor oral intake even prior to this hospitalization. Due to dysphagia-she was on NG tube feedings-however once oral intake was started-she continued to exhibit very poor oral intake.  Extensive discussion with family and subsequently by the palliative care team-on 4/6-patient was transitioned to full comfort measures with plans to transition to residential hospice on discharge-however per hospice-patient may not qualify from residential hospice (see notes).  Palliative care has discussed with family on 4/9- being discharged back to ALF with hospice care.  Obesity:  Placing the pt at high risk at poor outcomes.  Body mass index is 32.59 kg/m.   Moderate protein calore malnutrition  Nutrition Problem: Moderate Malnutrition Etiology: chronic illness Nutrition Interventions: Interventions: Tube feeding   Sacral medial stage 2 pressure ulcer POA Left  buttocks stage 2 pressure ulcer POA Continue foam dressing. Pt had rectal tube for protection of the wounds as well.   Pressure Injury 07/30/20 Sacrum Mid Stage 2 -  Partial thickness loss of dermis presenting as a shallow open injury with a red, pink wound bed without slough. linear open area on sacrum (Active)  07/30/20 1530  Location: Sacrum  Location Orientation: Mid  Staging: Stage 2 -  Partial thickness loss of dermis presenting as a shallow open injury with a red, pink wound bed without slough.  Wound Description (Comments): linear open area on sacrum  Present on Admission: Yes     Pressure Injury 07/30/20 Buttocks Left;Medial Stage 2 -  Partial thickness loss of dermis presenting as a shallow open injury with a red, pink wound bed without slough. small opening to inner buttock (Active)  07/30/20 1530  Location: Buttocks  Location Orientation: Left;Medial  Staging: Stage 2 -  Partial thickness loss of dermis presenting as a shallow open injury with a red, pink wound bed without slough.  Wound Description (Comments): small opening to inner buttock  Present on Admission: Yes    Significant events: 3/30>> admit to ICU 4/2>> transferred to [facility] 4/4>> dysphagia 2 diet started 4/6>> cortrack tube removed 4/6>> transitioned to comfort measures.  Significant studies: 2/13>> Echo: EF 55-60%, grade 1 diastolic dysfunction. 3/30>> CT head: No acute intracranial abnormality. 3/30>> CT renal stone study: Bibasilar pulmonary infiltrates, no acute [place] abdominal pathology. 3/30>> chest x-ray: Stable bibasilar pulmonary  fibrotic changes. 3/30>> MRI brain: Increased size of acute to subacute infarcts of the right basal ganglia.  On the day of the discharge the patient's vitals were stable, and no other acute medical condition were reported by patient. The patient was felt safe to be discharge at ALF with hospice.  Consultants: Palliative care PCCM Neurology Cardiology  Procedures:  Echocardiogram   DISCHARGE MEDICATION: Allergies as of 08/11/2020      Reactions   Penicillins Swelling, Other (See Comments)   Throat swelling/ turned red   Sulfa Antibiotics Swelling, Other (See Comments)   Throat swelling/turned red      Medication List    STOP taking these medications   atorvastatin 80 MG tablet Commonly known as: LIPITOR   FLUoxetine 10 MG capsule Commonly known as: PROZAC   fosinopril 40 MG tablet Commonly known as: MONOPRIL   metFORMIN 500 MG tablet Commonly known as: Glucophage   metoprolol succinate 25 MG 24 hr tablet Commonly known as: TOPROL-XL     TAKE these medications   LORazepam 0.5 MG tablet Commonly known as: ATIVAN Take 1 tablet (0.5 mg total) by mouth 2 (two) times daily.   LORazepam 1 MG tablet Commonly known as: ATIVAN Take 1 tablet (1 mg total) by mouth every hour as needed for anxiety, seizure, sedation or sleep.   morphine CONCENTRATE 10 mg / 0.5 ml concentrated solution Take 0.25 mLs (5 mg total) by mouth every 2 (two) hours as needed for severe pain.            Discharge Care Instructions  (From admission, onward)         Start     Ordered   08/09/20 0000  Discharge wound care:       Comments: Wound care to partial thickness open lesions on sacrum and left gluteal cleft (POA): Cleanse with soap and water, rinse and pat dry. Cover with xeroform gauze [place] # [id number]), top with dry gauze and cover with silicone foam.  Turn patient side to side and minimize time in the supine position.   08/09/20 1123          Discharge Exam: Filed Weights   08/04/20 0517 08/05/20 0603 08/06/20 0643  Weight: 100.2 kg 100.2 kg 100.1 kg   Vitals:   08/10/20 0956 08/11/20 0412  BP: 138/73 140/80  Pulse: 74 73  Resp: 16 16  Temp: 98.4 F (36.9 C) 98.1 F (36.7 C)  SpO2: 96% 96%   General: Appear in no distress, no Rash; Oral Mucosa Clear, moist. no Abnormal Neck Mass Or lumps, Conjunctiva normal  Cardiovascular: S1 and S2  Present, no Murmur Respiratory: good respiratory effort, Bilateral Air entry present and CTA, no Crackles, no wheezes Abdomen: Bowel Sound present, Soft and no tenderness Extremities: trace Pedal edema Neurology: alert and oriented to place and person affect appropriate. no new focal deficit  The results of significant diagnostics from this hospitalization (including imaging, microbiology, ancillary and laboratory) are listed below for reference.    Significant Diagnostic Studies: CT Head Wo Contrast  Result Date: 07/30/2020 CLINICAL DATA:  Delirium EXAM: CT HEAD WITHOUT CONTRAST TECHNIQUE: Contiguous axial images were obtained from the base of the skull through the vertex without intravenous contrast. COMPARISON:  06/13/2020 FINDINGS: Brain: There is an old posterior left MCA territory infarct. No acute hemorrhage. Mild generalized atrophy with old bilateral small vessel infarcts. Vascular: Atherosclerotic calcification of the internal carotid arteries at the skull base. No abnormal hyperdensity of the major intracranial arteries or dural  venous sinuses. Skull: The visualized skull base, calvarium and extracranial soft tissues are normal. Sinuses/Orbits: No fluid levels or advanced mucosal thickening of the visualized paranasal sinuses. No mastoid or middle ear effusion. The orbits are normal. IMPRESSION: 1. No acute intracranial abnormality. 2. Old posterior left MCA territory infarct. Electronically Signed   By: Deatra RobinsonKevin  Herman M.D.   On: 07/30/2020 01:34   MR BRAIN WO CONTRAST  Result Date: 07/30/2020 CLINICAL DATA:  Encephalopathy EXAM: MRI HEAD WITHOUT CONTRAST TECHNIQUE: Multiplanar, multiecho pulse sequences of the brain and surrounding structures were obtained without intravenous contrast. COMPARISON:  Brain MRI 06/17/2020 FINDINGS: Brain: Areas of abnormal diffusion restriction in the right basal ganglia are now more confluent. Foci in the infarcted region of the left parietal/temporal lobe  are unchanged. Left parietal/temporal encephalomalacia. No acute or chronic hemorrhage. Normal white matter signal, parenchymal volume and CSF spaces. The midline structures are normal. Vascular: Major flow voids are preserved. Skull and upper cervical spine: Normal calvarium and skull base. Visualized upper cervical spine and soft tissues are normal. Sinuses/Orbits:No paranasal sinus fluid levels or advanced mucosal thickening. No mastoid or middle ear effusion. Normal orbits. IMPRESSION: 1. Increased size of acute to subacute infarcts of the right basal ganglia. No hemorrhage or mass effect. 2. Unchanged old left posterior MCA infarct. Electronically Signed   By: Deatra RobinsonKevin  Herman M.D.   On: 07/30/2020 19:01   DG Chest Port 1 View  Result Date: 07/30/2020 CLINICAL DATA:  Sepsis EXAM: PORTABLE CHEST 1 VIEW COMPARISON:  06/13/2020 FINDINGS: Lung volumes are small, but are symmetric. Bibasilar fibrotic changes are again identified. No superimposed focal pulmonary infiltrate. No pneumothorax or pleural effusion. Cardiac size is within normal limits. The pulmonary vascularity is normal. No acute bone abnormality. IMPRESSION: Stable bibasilar pulmonary fibrotic changes. No radiographic evidence of acute cardiopulmonary disease. Electronically Signed   By: Helyn NumbersAshesh  Parikh MD   On: 07/30/2020 01:14   CT RENAL STONE STUDY  Result Date: 07/30/2020 CLINICAL DATA:  Flank pain, sepsis, urinary tract infection EXAM: CT ABDOMEN AND PELVIS WITHOUT CONTRAST TECHNIQUE: Multidetector CT imaging of the abdomen and pelvis was performed following the standard protocol without IV contrast. COMPARISON:  None. FINDINGS: Lower chest: Bibasilar pulmonary infiltrates are present, not well assessed on this examination. Mild coronary artery calcification. Global cardiac size within normal limits. No pericardial effusion. Hepatobiliary: No focal liver abnormality is seen. Status post cholecystectomy. No biliary dilatation. Pancreas:  Unremarkable Spleen: Unremarkable Adrenals/Urinary Tract: Adrenal glands are unremarkable. Kidneys are normal, without renal calculi, focal lesion, or hydronephrosis. Bladder is unremarkable. Stomach/Bowel: Moderate descending and sigmoid colonic diverticulosis. The stomach, small bowel, and large bowel are otherwise unremarkable. Appendix normal. No free intraperitoneal gas or fluid. Vascular/Lymphatic: Moderate aortoiliac atherosclerotic calcification. No aortic aneurysm. No pathologic adenopathy within the abdomen and pelvis. Reproductive: Uterus and bilateral adnexa are unremarkable. Other: Tiny fat containing umbilical hernia.  Rectum unremarkable. Musculoskeletal: No acute bone abnormality. Degenerative changes are seen within the lumbar spine. No lytic or blastic bone lesions. IMPRESSION: Bibasilar pulmonary infiltrates, not well assessed on this examination. While associated with volume loss, these do not have the typical appearance of pulmonary fibrosis and additional considerations should include interstitial lung disease, as can be seen with connective tissue disorders or drug reaction. No acute intra-abdominal pathology. No definite radiographic explanation for the patient's reported symptoms. Moderate distal colonic diverticulosis. Aortic Atherosclerosis (ICD10-I70.0). Electronically Signed   By: Helyn NumbersAshesh  Parikh MD   On: 07/30/2020 06:14   Microbiology: No results found for this or  any previous visit (from the past 240 hour(s)).   Labs: CBC: Recent Labs  Lab 08/05/20 0633 08/06/20 0226  WBC 6.8 6.2  HGB 10.6* 9.9*  HCT 34.1* 31.9*  MCV 90.5 91.4  PLT 167 182   Basic Metabolic Panel: Recent Labs  Lab 08/05/20 0633 08/06/20 0226  NA 141  --   K 4.9  --   CL 111  --   CO2 25  --   GLUCOSE 186*  --   BUN 13  --   CREATININE 0.70  --   CALCIUM 8.5*  --   PHOS 2.4* 2.6   Liver Function Tests: No results for input(s): AST, ALT, ALKPHOS, BILITOT, PROT, ALBUMIN in the last 168  hours. CBG: Recent Labs  Lab 08/06/20 0048 08/06/20 0526 08/06/20 0724 08/06/20 1134 08/06/20 1619  GLUCAP 146* 135* 121* 136* 127*    Time spent: 35 minutes  Signed:  Lynden Oxford  [facility]  08/11/2020 8:48 AM

## 2020-08-12 ENCOUNTER — Other Ambulatory Visit (HOSPITAL_COMMUNITY): Payer: Self-pay

## 2020-08-12 MED ORDER — LOPERAMIDE HCL 2 MG PO CAPS: 2.0000 mg | ORAL_CAPSULE | ORAL | Status: DC | PRN

## 2020-08-12 MED ORDER — LOPERAMIDE HCL 2 MG PO CAPS
2.0000 mg | ORAL_CAPSULE | ORAL | 0 refills | Status: AC | PRN
  Filled 2020-08-12: qty 30, 7d supply, fill #0

## 2020-08-12 NOTE — Progress Notes (Signed)
Civil engineer, contracting [facility])  Received request from [facility] for hospice services at home after discharge.  Chart and pt information under review by [facility] physician.  Hospice eligibility pending at this time.  Hospital liaison spoke with son [name] to confirm hospice at [facility]. Spoke with staff at [facility] to confirm DME has been delivered. Per discussion the plan is to discharge home today by [facility]AR.  [place] send signed and completed DNR home with pt/family.  Please provide prescriptions at discharge as needed to ensure ongoing symptom management until pt can be admitted onto hospice services.    [facility] information and contact numbers given to son [name].  Above information shared with Liana Crocker Manager.  Please call with any questions or concerns.  Thank you for the opportunity to participate in this pt's care.  Gillian Scarce, BSN, RN [employer] [phone] (24h on call)

## 2020-08-12 NOTE — Discharge Summary (Signed)
[facility] Discharge Summary   Patient: Kristy Jensen ZOX:[id number]  PCP: Patient, No Pcp Per (Inactive)  Date of admission: 07/30/2020   Date of discharge:  08/12/2020     Discharge Diagnoses:  Principal Problem:   Sepsis (HCC) Active Problems:   Metabolic encephalopathy   CVA (cerebral vascular accident) (HCC)   Acute lower UTI   ARF (acute renal failure) (HCC)   Shock (HCC)   Pressure injury of skin   Atrial fibrillation (HCC)   Malnutrition of moderate degree   Hypomagnesemia   SVT (supraventricular tachycardia) (HCC)  Admitted From: SNF Disposition:  ALF/ILF with hospice  Recommendations for Outpatient Follow-up:  1. PCP: establish care with hospice 2. Follow up LABS/TEST:  none   Follow-up Information    Meriam Sprague, MD. Schedule an appointment as soon as possible for a visit in 1 week(s).   Specialties: Cardiology, Radiology Contact information: [address] [place] [address] [phone]              Discharge Instructions    Diet - low sodium heart healthy   Complete by: As directed    Discharge wound care:   Complete by: As directed    Wound care to partial thickness open lesions on sacrum and left gluteal cleft (POA): Cleanse with soap and water, rinse and pat dry. Cover with xeroform gauze [place] # [id number]), top with dry gauze and cover with silicone foam.  Turn patient side to side and minimize time in the supine position.   Increase activity slowly   Complete by: As directed      Diet recommendation: Regular diet  Activity: The patient is advised to gradually reintroduce usual activities, as tolerated  Discharge Condition: stable  Code Status: DNR   History of present illness: As per the H and P dictated on admission, "Kristy Jensen is a 70 y.o. female with history of CVA, hypertension, hyperglycemia, tachycardia likely from an SVT versus atrial flutter admitted last month for stroke at that time patient  also was found to be encephalopathic likely from stroke and UTI had brief runs of tachycardia cardiology was consulted at that time concern was for an SVT versus atrial flutter was brought to the ER after patient was found to have a change in mental status.  EMS on arrival found patient to be hypotensive.  No further history is available at this time as patient is confused and I was not able to reach patient's son.  ED Course: In the ER patient was initially hypotensive was given fluid bolus blood cultures urine cultures obtained started on empiric antibiotics for sepsis likely from UTI since UA is concerning for UTI.  CT head was unremarkable.  On my exam patient is still encephalopathic.  Labs show acute renal failure with creatinine worsening from June 19, 2020 last month was normal at 0.8 is around 3.9.  Sodium 145 potassium 5.5 lactic acid was 2.4 worsened to 3.9 further fluid bolus have been ordered WBC count 19.8.  In the ER patient went into A. fib with RVR.  I am ordering metoprolol IV since patient blood pressure has improved at this time with fluids.  Covid test is negative chest x-ray shows chronic changes."  Hospital Course:  Summary of her active problems in the hospital is as following.   Septic shock due to complicated UTI:  Sepsis physiology has resolved culture data inconclusive  being treated as complicated UTI. Has completed a course of aztreonam.  Acute metabolic  encephalopathy: resolved  Felt to be due to AKI/sepsis Improving Continue supportive care.  Evaluated by neurology MRI brain showed increased size of the subacute infarct Per neuro, she is not felt to have acute CVA clinically.  All antiplatelet agents discontinued as patient transitioned to full comfort measures.  PAF with RVR-SVT: Evaluated by cardiology during this hospital stay Was on metoprolol and heparin drip with plans to switch to heparin however all medication/anticoagulation discontinued as  patient transitioned to full comfort measures   ZOX:WRUEAV hemodynamically mediated-resolved.  Hypokalemia/hypomagnesemia/hypophosphatemia: Repleted.  Hypernatremia:Resolved  Normocytic anemia:Due to acute illness-no evidence of blood loss-monitor  Known right thyroid goiter with 1.7 cm nodule:TSH within normal limit-plans were for outpatient FNA-however since transitioned to comfort measures-doubt further work-up is required.   Recent CVA February 2022: N Neuro consulted  Recommended anticoagulation with Eliquis  No longer on anticoagulation once transitioned to full comfort measures.  Dysphagia:  Probably chronic-worsened by encephalopathy/acute illness.  require NG tube feedings evaluated by SLP on 4/4-dysphagia 2 diet started-tolerating continues to have very poor oral intake  Goals of care: Frail-recurrent hospitalizations-severe dysphagia-debilitated-DNR-palliative care meeting with family on 4/6. Per discussion with patient's son on 4/5-patient had significant decline in function over the past several months-very poor oral intake even prior to this hospitalization. Due to dysphagia-she was on NG tube feedings-however once oral intake was started-she continued to exhibit very poor oral intake.  Extensive discussion with family and subsequently by the palliative care team-on 4/6-patient was transitioned to full comfort measures with plans to transition to residential hospice on discharge-however per hospice-patient may not qualify from residential hospice (see notes).  Palliative care has discussed with family on 4/9- being discharged back to ALF with hospice care.  Obesity:  Placing the pt at high risk at poor outcomes.  Body mass index is 32.59 kg/m.   Moderate protein calore malnutrition  Nutrition Problem: Moderate Malnutrition Etiology: chronic illness Nutrition Interventions: Interventions: Tube feeding   Sacral medial stage 2 pressure ulcer POA Left  buttocks stage 2 pressure ulcer POA Continue foam dressing. Pt had rectal tube for protection of the wounds as well.   Pressure Injury 07/30/20 Sacrum Mid Stage 2 -  Partial thickness loss of dermis presenting as a shallow open injury with a red, pink wound bed without slough. linear open area on sacrum (Active)  07/30/20 1530  Location: Sacrum  Location Orientation: Mid  Staging: Stage 2 -  Partial thickness loss of dermis presenting as a shallow open injury with a red, pink wound bed without slough.  Wound Description (Comments): linear open area on sacrum  Present on Admission: Yes     Pressure Injury 07/30/20 Buttocks Left;Medial Stage 2 -  Partial thickness loss of dermis presenting as a shallow open injury with a red, pink wound bed without slough. small opening to inner buttock (Active)  07/30/20 1530  Location: Buttocks  Location Orientation: Left;Medial  Staging: Stage 2 -  Partial thickness loss of dermis presenting as a shallow open injury with a red, pink wound bed without slough.  Wound Description (Comments): small opening to inner buttock  Present on Admission: Yes    Significant events: 3/30>> admit to ICU 4/2>> transferred to [facility] 4/4>> dysphagia 2 diet started 4/6>> cortrack tube removed 4/6>> transitioned to comfort measures.  Significant studies: 2/13>> Echo: EF 55-60%, grade 1 diastolic dysfunction. 3/30>> CT head: No acute intracranial abnormality. 3/30>> CT renal stone study: Bibasilar pulmonary infiltrates, no acute [place] abdominal pathology. 3/30>> chest x-ray: Stable bibasilar pulmonary  fibrotic changes. 3/30>> MRI brain: Increased size of acute to subacute infarcts of the right basal ganglia.  On the day of the discharge the patient's vitals were stable, and no other acute medical condition were reported by patient. The patient was felt safe to be discharge at ALF with hospice.  Consultants: Palliative care PCCM Neurology Cardiology  Procedures:  Echocardiogram   DISCHARGE MEDICATION: Allergies as of 08/12/2020      Reactions   Penicillins Swelling, Other (See Comments)   Throat swelling/ turned red   Sulfa Antibiotics Swelling, Other (See Comments)   Throat swelling/turned red      Medication List    STOP taking these medications   atorvastatin 80 MG tablet Commonly known as: LIPITOR   FLUoxetine 10 MG capsule Commonly known as: PROZAC   fosinopril 40 MG tablet Commonly known as: MONOPRIL   metFORMIN 500 MG tablet Commonly known as: Glucophage   metoprolol succinate 25 MG 24 hr tablet Commonly known as: TOPROL-XL     TAKE these medications   loperamide 2 MG capsule Commonly known as: IMODIUM Take 1 capsule (2 mg total) by mouth as needed for diarrhea or loose stools.   LORazepam 0.5 MG tablet Commonly known as: ATIVAN Take 1 tablet (0.5 mg total) by mouth 2 (two) times daily.   LORazepam 1 MG tablet Commonly known as: ATIVAN Take 1 tablet (1 mg total) by mouth every hour as needed for anxiety, seizure, sedation or sleep.   morphine CONCENTRATE 10 mg / 0.5 ml concentrated solution Take 0.25 mLs (5 mg total) by mouth every 2 (two) hours as needed for severe pain.            Discharge Care Instructions  (From admission, onward)         Start     Ordered   08/09/20 0000  Discharge wound care:       Comments: Wound care to partial thickness open lesions on sacrum and left gluteal cleft (POA): Cleanse with soap and water, rinse and pat dry. Cover with xeroform gauze [place] # [id number]), top with dry gauze and cover with silicone foam.  Turn patient side to side and minimize time in the supine position.   08/09/20 1123          Discharge Exam: Filed Weights   08/04/20 0517 08/05/20 0603 08/06/20 0643  Weight: 100.2 kg 100.2 kg 100.1 kg   Vitals:   08/11/20 0412 08/12/20 0256  BP: 140/80 (!) 151/79  Pulse: 73 78  Resp: 16 18  Temp: 98.1 F (36.7 C) 99.1 F (37.3 C)  SpO2: 96% 100%   General:  Appear in no distress, no Rash; Oral Mucosa Clear, moist. no Abnormal Neck Mass Or lumps, Conjunctiva normal  Cardiovascular: S1 and S2 Present, no Murmur Respiratory: good respiratory effort, Bilateral Air entry present and CTA, no Crackles, no wheezes Abdomen: Bowel Sound present, Soft and no tenderness Extremities: trace Pedal edema Neurology: alert and oriented to place and person affect appropriate. no Kristy focal deficit  The results of significant diagnostics from this hospitalization (including imaging, microbiology, ancillary and laboratory) are listed below for reference.    Significant Diagnostic Studies: CT Head Wo Contrast  Result Date: 07/30/2020 CLINICAL DATA:  Delirium EXAM: CT HEAD WITHOUT CONTRAST TECHNIQUE: Contiguous axial images were obtained from the base of the skull through the vertex without intravenous contrast. COMPARISON:  06/13/2020 FINDINGS: Brain: There is an old posterior left MCA territory infarct. No acute hemorrhage. Mild generalized atrophy with old  bilateral small vessel infarcts. Vascular: Atherosclerotic calcification of the internal carotid arteries at the skull base. No abnormal hyperdensity of the major intracranial arteries or dural venous sinuses. Skull: The visualized skull base, calvarium and extracranial soft tissues are normal. Sinuses/Orbits: No fluid levels or advanced mucosal thickening of the visualized paranasal sinuses. No mastoid or middle ear effusion. The orbits are normal. IMPRESSION: 1. No acute intracranial abnormality. 2. Old posterior left MCA territory infarct. Electronically Signed   By: Deatra Robinson M.D.   On: 07/30/2020 01:34   MR BRAIN WO CONTRAST  Result Date: 07/30/2020 CLINICAL DATA:  Encephalopathy EXAM: MRI HEAD WITHOUT CONTRAST TECHNIQUE: Multiplanar, multiecho pulse sequences of the brain and surrounding structures were obtained without intravenous contrast. COMPARISON:  Brain MRI 06/17/2020 FINDINGS: Brain: Areas of abnormal  diffusion restriction in the right basal ganglia are now more confluent. Foci in the infarcted region of the left parietal/temporal lobe are unchanged. Left parietal/temporal encephalomalacia. No acute or chronic hemorrhage. Normal white matter signal, parenchymal volume and CSF spaces. The midline structures are normal. Vascular: Major flow voids are preserved. Skull and upper cervical spine: Normal calvarium and skull base. Visualized upper cervical spine and soft tissues are normal. Sinuses/Orbits:No paranasal sinus fluid levels or advanced mucosal thickening. No mastoid or middle ear effusion. Normal orbits. IMPRESSION: 1. Increased size of acute to subacute infarcts of the right basal ganglia. No hemorrhage or mass effect. 2. Unchanged old left posterior MCA infarct. Electronically Signed   By: Deatra Robinson M.D.   On: 07/30/2020 19:01   DG Chest Port 1 View  Result Date: 07/30/2020 CLINICAL DATA:  Sepsis EXAM: PORTABLE CHEST 1 VIEW COMPARISON:  06/13/2020 FINDINGS: Lung volumes are small, but are symmetric. Bibasilar fibrotic changes are again identified. No superimposed focal pulmonary infiltrate. No pneumothorax or pleural effusion. Cardiac size is within normal limits. The pulmonary vascularity is normal. No acute bone abnormality. IMPRESSION: Stable bibasilar pulmonary fibrotic changes. No radiographic evidence of acute cardiopulmonary disease. Electronically Signed   By: Helyn Numbers MD   On: 07/30/2020 01:14   CT RENAL STONE STUDY  Result Date: 07/30/2020 CLINICAL DATA:  Flank pain, sepsis, urinary tract infection EXAM: CT ABDOMEN AND PELVIS WITHOUT CONTRAST TECHNIQUE: Multidetector CT imaging of the abdomen and pelvis was performed following the standard protocol without IV contrast. COMPARISON:  None. FINDINGS: Lower chest: Bibasilar pulmonary infiltrates are present, not well assessed on this examination. Mild coronary artery calcification. Global cardiac size within normal limits. No  pericardial effusion. Hepatobiliary: No focal liver abnormality is seen. Status post cholecystectomy. No biliary dilatation. Pancreas: Unremarkable Spleen: Unremarkable Adrenals/Urinary Tract: Adrenal glands are unremarkable. Kidneys are normal, without renal calculi, focal lesion, or hydronephrosis. Bladder is unremarkable. Stomach/Bowel: Moderate descending and sigmoid colonic diverticulosis. The stomach, small bowel, and large bowel are otherwise unremarkable. Appendix normal. No free intraperitoneal gas or fluid. Vascular/Lymphatic: Moderate aortoiliac atherosclerotic calcification. No aortic aneurysm. No pathologic adenopathy within the abdomen and pelvis. Reproductive: Uterus and bilateral adnexa are unremarkable. Other: Tiny fat containing umbilical hernia.  Rectum unremarkable. Musculoskeletal: No acute bone abnormality. Degenerative changes are seen within the lumbar spine. No lytic or blastic bone lesions. IMPRESSION: Bibasilar pulmonary infiltrates, not well assessed on this examination. While associated with volume loss, these do not have the typical appearance of pulmonary fibrosis and additional considerations should include interstitial lung disease, as can be seen with connective tissue disorders or drug reaction. No acute intra-abdominal pathology. No definite radiographic explanation for the patient's reported symptoms. Moderate distal colonic diverticulosis.  Aortic Atherosclerosis (ICD10-I70.0). Electronically Signed   By: Helyn Numbers MD   On: 07/30/2020 06:14   Microbiology: Recent Results (from the past 240 hour(s))  SARS CORONAVIRUS 2 (TAT 6-24 HRS) Nasopharyngeal Nasopharyngeal Swab     Status: None   Collection Time: 08/10/20  6:31 PM   Specimen: Nasopharyngeal Swab  Result Value Ref Range Status   SARS Coronavirus 2 NEGATIVE NEGATIVE Final    Comment: (NOTE) SARS-CoV-2 target nucleic acids are NOT DETECTED.  The SARS-CoV-2 RNA is generally detectable in upper and  lower respiratory specimens during the acute phase of infection. Negative results do not preclude SARS-CoV-2 infection, do not rule out co-infections with other pathogens, and should not be used as the sole basis for treatment or other patient management decisions. Negative results must be combined with clinical observations, patient history, and epidemiological information. The expected result is Negative.  Fact Sheet for Patients: HairSlick.no  Fact Sheet for Healthcare Providers: quierodirigir.com  This test is not yet approved or cleared by the [place] FDA and  has been authorized for detection and/or diagnosis of SARS-CoV-2 by FDA under an Emergency Use Authorization (EUA). This EUA will remain  in effect (meaning this test can be used) for the duration of the COVID-19 declaration under Se ction 564(b)(1) of the Act, 21 U.S.C. section 360bbb-3(b)(1), unless the authorization is terminated or revoked sooner.  Performed at [facility], [address], [place], [place] [address]      Labs: CBC: Recent Labs  Lab 08/06/20 0226  WBC 6.2  HGB 9.9*  HCT 31.9*  MCV 91.4  PLT 182   Basic Metabolic Panel: Recent Labs  Lab 08/06/20 0226  PHOS 2.6   Liver Function Tests: No results for input(s): AST, ALT, ALKPHOS, BILITOT, PROT, ALBUMIN in the last 168 hours. CBG: Recent Labs  Lab 08/06/20 0048 08/06/20 0526 08/06/20 0724 08/06/20 1134 08/06/20 1619  GLUCAP 146* 135* 121* 136* 127*    Time spent: 35 minutes  Signed:  Lynden Oxford  [facility]  08/12/2020 4:18 PM

## 2020-08-12 NOTE — Progress Notes (Signed)
Daily Progress Note   Patient Name: Kristy Jensen       Date: 08/12/2020 DOB: [birth year]  Age: 70 y.o. MRN#: [id number] Attending Physician: Rolly Salter, MD Primary Care Physician: Patient, No Pcp Per (Inactive) Admit Date: 07/30/2020  Reason for Consultation/Follow-up: Non pain symptom management, Pain control, Psychosocial/spiritual support and Terminal Care  Subjective: Chart review performed. Received report from primary RN - no acute concerns. RN reports patient remains confused and requires strong prompting to take medications with bites of applesauce. RN states patient is not engaged with interactions and has minimal PO intake.  Went to visit patient at bedside - no family/visitors present. Patient was lying in bed - she briefly opens her eyes to voice/gentle touch but promptly falls back asleep. No signs or non-verbal gestures of pain or discomfort noted. No respiratory distress, increased work of breathing, or secretions noted.   Length of Stay: 13  Current Medications: Scheduled Meds:  . antiseptic oral rinse  15 mL Topical BID  . LORazepam  0.5 mg Oral BID    Continuous Infusions:   PRN Meds: acetaminophen **OR** acetaminophen, docusate sodium, glycopyrrolate **OR** glycopyrrolate **OR** glycopyrrolate, haloperidol **OR** haloperidol **OR** haloperidol lactate, LORazepam **OR** LORazepam **OR** LORazepam, morphine injection, ondansetron **OR** ondansetron (ZOFRAN) IV, polyethylene glycol, polyvinyl alcohol  Physical Exam Vitals and nursing note reviewed.  Constitutional:      General: She is not in acute distress.    Appearance: She is ill-appearing.  Pulmonary:     Effort: No respiratory distress.  Skin:    General: Skin is warm and dry.  Neurological:      Mental Status: She is lethargic.     Motor: Weakness present.  Psychiatric:        Speech: She is noncommunicative.             Vital Signs: BP (!) 151/79 (BP Location: Left Arm)   Pulse 78   Temp 99.1 F (37.3 C)   Resp 18   Ht 5\' 9"  (1.753 m)   Wt 100.1 kg   SpO2 100%   BMI 32.59 kg/m  SpO2: SpO2: 100 % O2 Device: O2 Device: Room Air O2 Flow Rate: O2 Flow Rate (L/min): 2 L/min  Intake/output summary:   Intake/Output Summary (Last 24 hours) at 08/12/2020 1123 Last data filed at 08/12/2020 10/12/2020 Gross  per 24 hour  Intake 480 ml  Output 450 ml  Net 30 ml   LBM: Last BM Date: 08/10/20 Baseline Weight: Weight: 99.8 kg Most recent weight: Weight: 100.1 kg       Palliative Assessment/Data: PPS 20%      Patient Active Problem List   Diagnosis Date Noted  . Hypomagnesemia 08/03/2020  . SVT (supraventricular tachycardia) (HCC) 08/03/2020  . Malnutrition of moderate degree 08/01/2020  . Atrial fibrillation (HCC)   . Sepsis (HCC) 07/30/2020  . ARF (acute renal failure) (HCC) 07/30/2020  . Shock (HCC) 07/30/2020  . Pressure injury of skin 07/30/2020  . Altered mental status 06/14/2020  . Metabolic encephalopathy 06/13/2020  . CVA (cerebral vascular accident) (HCC) 06/13/2020  . Acute lower UTI 06/13/2020  . Hyperglycemia 06/13/2020    Palliative Care Assessment & Plan   Patient Profile: 70 y.o.femalewith past medical history of CVA, UTIs, Afib with SVT recently admitted and discharge February with UTI and AMS-admitted on 3/30/2022with alterend mental status, a fib and RVR.Additionally with acute kidney failure, hyperkalemia, hypernatremia. Admission complicated by sepsis from UTI and MRI on 3/30 indicating increased size in acute CVA. SLP eval indicates difficulty following commands, unable to demonstrate volitional swallow. Palliative medicine consulted [facility].  Assessment: Septic shock secondary to complicated UTI Acute metabolic encephalopathy PAF with  RVR-SVT Dysphasia Recent CVA Terminal care  Recommendations/Plan:  Continue full comfort measures  Continue DNR/DNI as previously documented  Plan is for discharge to [facility] with hospice   Continue current comfort focused medication regimen - patient appears comfortable  PMT will continue to follow and support holistically  Goals of Care and Additional Recommendations:  Limitations on Scope of Treatment: Full Comfort Care  Code Status:    Code Status Orders  (From admission, onward)         Start     Ordered   08/06/20 1759  Do not attempt resuscitation (DNR)  Continuous       Question Answer Comment  In the event of cardiac or respiratory ARREST Do not call a "code blue"   In the event of cardiac or respiratory ARREST Do not perform Intubation, CPR, defibrillation or ACLS   In the event of cardiac or respiratory ARREST Use medication by any route, position, wound care, and other measures to relive pain and suffering. May use oxygen, suction and manual treatment of airway obstruction as needed for comfort.      08/06/20 1805        Code Status History    Date Active Date Inactive Code Status Order ID Comments User Context   08/02/2020 1711 08/06/2020 1805 DNR [id number]  Barbara Cower, NP Inpatient   07/30/2020 0841 08/02/2020 1711 Full Code [id number]  Minor, Vilinda Blanks, NP ED   07/30/2020 0408 07/30/2020 0840 Full Code [id number]  Eduard Clos, MD ED   06/13/2020 2226 06/19/2020 2036 Full Code [id number]  Rometta Emery, MD ED   06/13/2020 2020 06/13/2020 2226 Full Code [id number]  Rometta Emery, MD ED   Advance Care Planning Activity       Prognosis:    < 3 weeks  Discharge Planning:  Skilled Nursing Facility with Hospice  Care plan was discussed with primary RN  Thank you for allowing the Palliative Medicine Team to assist in the care of this patient.   Total Time 15 minutes Prolonged Time Billed  no       Greater than 50%  of this time was  spent counseling  and coordinating care related to the above assessment and plan.  Lin Landsman, NP  Please contact Palliative Medicine Team phone at [phone] for questions and concerns.

## 2020-08-13 NOTE — Progress Notes (Signed)
Patient seen and examined. She is resting comfortably in bed. Lungs are clear bilaterally and heart sounds are normal.  DC summary done yesterday by Dr. Allena Katz was reviewed and agree with plan of care. There has been no change in her condition since yesterday and no change in discharge medications. Agree with discharge plans. She is stable for discharge to hospice facility today.  [employer]

## 2020-08-13 NOTE — TOC Transition Note (Signed)
Transition of Care [facility]) - CM/SW Discharge Note   Patient Details  Name: Kristy Jensen MRN: [id number] Date of Birth: 1950-05-17  Transition of Care [facility]) CM/SW Contact:  Jimmy Picket, [place] Phone Number: 08/13/2020, 11:20 AM   Clinical Narrative:     Patient will DC to: [facility] Anticipated DC date: 08/13/20 Family notified: Son Kristy Jensen  Transport by: Sharin Mons     Per MD patient ready for DC to [facility]. RN, patient, patient's family, and facility notified of DC. Discharge Summary and FL2 sent to facility. DC packet on chart. Ambulance transport requested for patient.    RN to call report to [phone].  CSW will sign off for now as social work intervention is no longer needed. Please consult [place] again if new needs arise.   Final next level of care: Skilled Nursing Facility Barriers to Discharge: Barriers Resolved   Patient Goals and CMS Choice Patient states their goals for this hospitalization and ongoing recovery are:: TBD CMS Medicare.gov Compare Post Acute Care list provided to:: Patient (SNF choices) Choice offered to / list presented to : Patient  Discharge Placement              Patient chooses bed at: Other - please specify in the comment section below: ([facility]) Patient to be transferred to facility by: ptar Name of family member notified: Son, Kristy Jensen Patient and family notified of of transfer: 08/13/20  Discharge Plan and Services     Post Acute Care Choice:  (TBD)                               Social Determinants of Health (SDOH) Interventions     Readmission Risk Interventions No flowsheet data found.  Jimmy Picket, Theresia Majors, [place] Clinical Social Worker [phone]

## 2020-08-13 NOTE — [place] FL2 (Signed)
[place] MEDICAID FL2 LEVEL OF CARE SCREENING TOOL     IDENTIFICATION  Patient Name: Kristy Jensen Birthdate: Apr 15, 1951 Sex: female Admission Date (Current Location): 07/30/2020  [facility] and [place] Number:  Producer, television/film/video and Address:  [facility], [address] [place], [place] [address]      Provider Number: [id number]  Attending Physician Name and Address:  Erick Blinks, MD  Relative Name and Phone Number:  Finley, Dinkel [phone]    Current Level of Care: Hospital Recommended Level of Care: Assisted Living Facility Prior Approval Number:    Date Approved/Denied:   PASRR Number: [id number]  Discharge Plan: Other (Comment) (ALF)    Current Diagnoses: Patient Active Problem List   Diagnosis Date Noted  . Hypomagnesemia 08/03/2020  . SVT (supraventricular tachycardia) (HCC) 08/03/2020  . Malnutrition of moderate degree 08/01/2020  . Atrial fibrillation (HCC)   . Sepsis (HCC) 07/30/2020  . ARF (acute renal failure) (HCC) 07/30/2020  . Shock (HCC) 07/30/2020  . Pressure injury of skin 07/30/2020  . Altered mental status 06/14/2020  . Metabolic encephalopathy 06/13/2020  . CVA (cerebral vascular accident) (HCC) 06/13/2020  . Acute lower UTI 06/13/2020  . Hyperglycemia 06/13/2020    Orientation RESPIRATION BLADDER Height & Weight     Time,Situation,Self,Place  Normal Incontinent Weight: 220 lb 10.9 oz (100.1 kg) Height:  5\' 9"  (175.3 cm)  BEHAVIORAL SYMPTOMS/MOOD NEUROLOGICAL BOWEL NUTRITION STATUS      Incontinent Diet (See discharge summary)  AMBULATORY STATUS COMMUNICATION OF NEEDS Skin   Total Care   PU Stage and Appropriate Care (Sacrum and buttock)                       Personal Care Assistance Level of Assistance  Feeding,Bathing,Dressing Bathing Assistance: Maximum assistance Feeding assistance: Maximum assistance Dressing Assistance: Maximum assistance     Functional Limitations Info   Sight,Speech,Hearing Sight Info: Adequate Hearing Info: Adequate Speech Info: Adequate    SPECIAL CARE FACTORS FREQUENCY                       Contractures      Additional Factors Info  Code Status,Allergies Code Status Info: DNR Allergies Info: Penicillins   Sulfa Antibiotics           Current Medications (08/13/2020):  This is the current hospital active medication list Current Facility-Administered Medications  Medication Dose Route Frequency Provider Last Rate Last Admin  . acetaminophen (TYLENOL) tablet 650 mg  650 mg Oral Q6H PRN 08/15/2020, MD   650 mg at 08/05/20 2249   Or  . acetaminophen (TYLENOL) suppository 650 mg  650 mg Rectal Q6H PRN 10/05/20, MD      . antiseptic oral rinse (BIOTENE) solution 15 mL  15 mL Topical BID Oretha Milch, NP   15 mL at 08/12/20 2056  . docusate sodium (COLACE) capsule 100 mg  100 mg Oral BID PRN 2057, MD      . glycopyrrolate (ROBINUL) tablet 1 mg  1 mg Oral Q4H PRN Oretha Milch, NP       Or  . glycopyrrolate (ROBINUL) injection 0.2 mg  0.2 mg Subcutaneous Q4H PRN Haskel Khan, NP       Or  . glycopyrrolate (ROBINUL) injection 0.2 mg  0.2 mg Intravenous Q4H PRN Haskel Khan M, NP      . haloperidol (HALDOL) tablet 2 mg  2 mg Oral Q6H  PRN Haskel Khan, NP       Or  . haloperidol (HALDOL) 2 MG/ML solution 2 mg  2 mg Sublingual Q6H PRN Haskel Khan, NP       Or  . haloperidol lactate (HALDOL) injection 2 mg  2 mg Intravenous Q6H PRN Girard Cooter M, NP      . loperamide (IMODIUM) capsule 2 mg  2 mg Oral PRN Rolly Salter, MD      . LORazepam (ATIVAN) tablet 1 mg  1 mg Oral Q1H PRN Haskel Khan, NP       Or  . LORazepam (ATIVAN) 2 MG/ML concentrated solution 1 mg  1 mg Sublingual Q1H PRN Haskel Khan, NP       Or  . LORazepam (ATIVAN) injection 1 mg  1 mg Intravenous Q1H PRN Girard Cooter M, NP      . LORazepam (ATIVAN) tablet 0.5 mg  0.5 mg Oral BID Maretta Bees, MD   0.5 mg at 08/12/20  2056  . morphine 2 MG/ML injection 1-2 mg  1-2 mg Intravenous Q2H PRN Girard Cooter M, NP      . ondansetron (ZOFRAN-ODT) disintegrating tablet 4 mg  4 mg Oral Q6H PRN Haskel Khan, NP       Or  . ondansetron (ZOFRAN) injection 4 mg  4 mg Intravenous Q6H PRN Haskel Khan, NP      . polyethylene glycol (MIRALAX / GLYCOLAX) packet 17 g  17 g Oral Daily PRN Oretha Milch, MD      . polyvinyl alcohol (LIQUIFILM TEARS) 1.4 % ophthalmic solution 1 drop  1 drop Both Eyes QID PRN Haskel Khan, NP         Discharge Medications: loperamide 2 MG capsule Commonly known as: IMODIUM Take 1 capsule (2 mg total) by mouth as needed for diarrhea or loose stools.   LORazepam 0.5 MG tablet Commonly known as: ATIVAN Take 1 tablet (0.5 mg total) by mouth 2 (two) times daily.   LORazepam 1 MG tablet Commonly known as: ATIVAN Take 1 tablet (1 mg total) by mouth every hour as needed for anxiety, seizure, sedation or sleep.   morphine CONCENTRATE 10 mg / 0.5 ml concentrated solution Take 0.25 mLs (5 mg total) by mouth every 2 (two) hours as needed for severe pain.            Relevant Imaging Results:  Relevant Lab Results:   Additional Information SSN [id number]  Jimmy Picket, [place]

## 2020-08-13 NOTE — Progress Notes (Signed)
[facility] ([facility])  [facility] is set to assess this pt for admission to hospice at he facility, [facility], at 330p today. Per discussion the plan is to discharge today by PTAR.    [place] send signed and completed DNR home with pt/family.  Please provide prescriptions at discharge as needed to ensure ongoing symptom management until pt can be admitted onto hospice.     [facility] information and contact numbers given to son [name].  Above information shared with Liana Crocker Manager.  Please call with any questions or concerns.  Thank you for the opportunity to participate in this pt's care.  Gillian Scarce, BSN, RN [employer] [phone] (24h on call)

## 2020-10-01 DEATH — deceased

## 2021-06-18 IMAGING — CT CT RENAL STONE PROTOCOL
2 of 4 series · 16 of 46 positions shown, 18 images · non-contrast
Comparison: None.

CLINICAL DATA: Flank pain, sepsis, urinary tract infection

EXAM:
CT ABDOMEN AND PELVIS WITHOUT CONTRAST
TECHNIQUE: Multidetector CT imaging of the abdomen and pelvis was performed
following the standard protocol without IV contrast.

[Series 3: renal stone 5.0 · axial · 0.98mm/px · z∈[+913,+1313]mm · 13 of 91 slices shown, 15 images]
[im 7/91  soft-tissue]
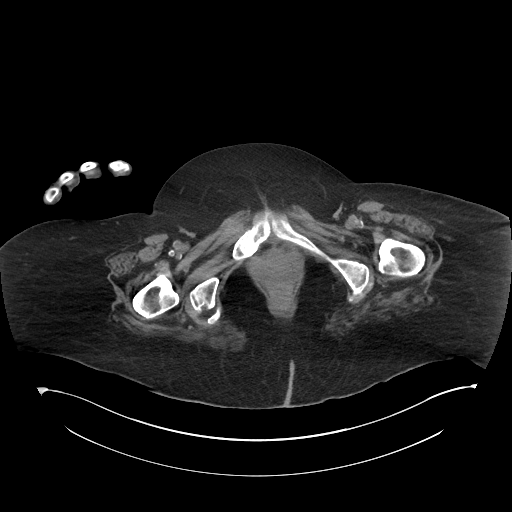
[im 7/91  bone]
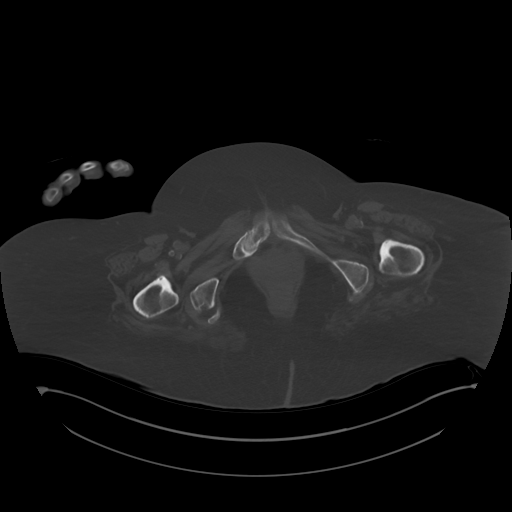
[im 14/91  soft-tissue]
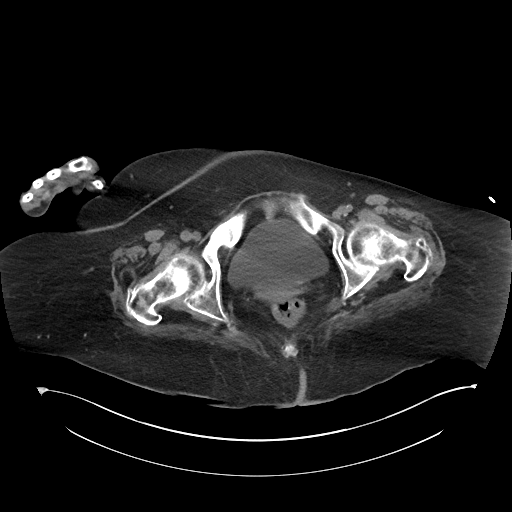
[im 21/91  soft-tissue]
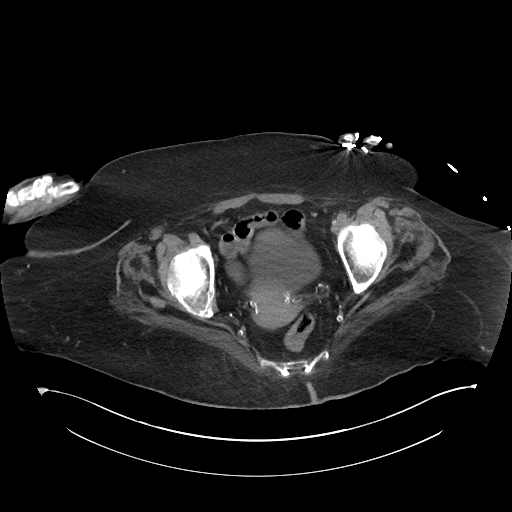
[im 27/91  soft-tissue]
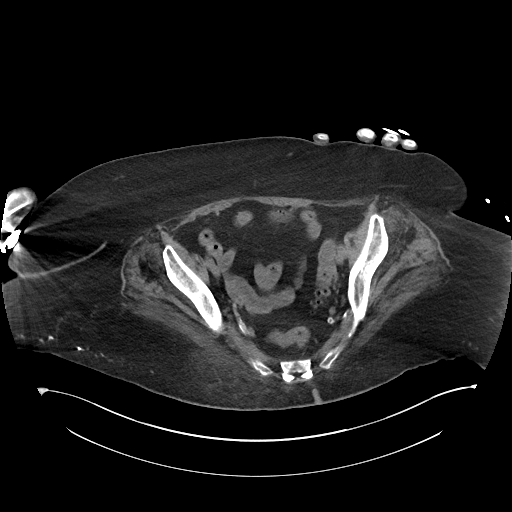
[im 34/91  soft-tissue]
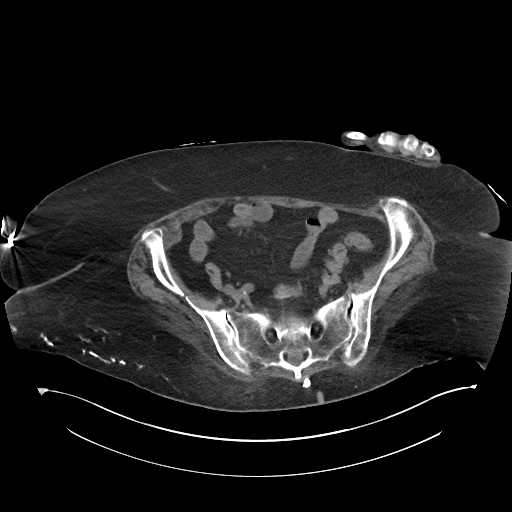
[im 41/91  soft-tissue]
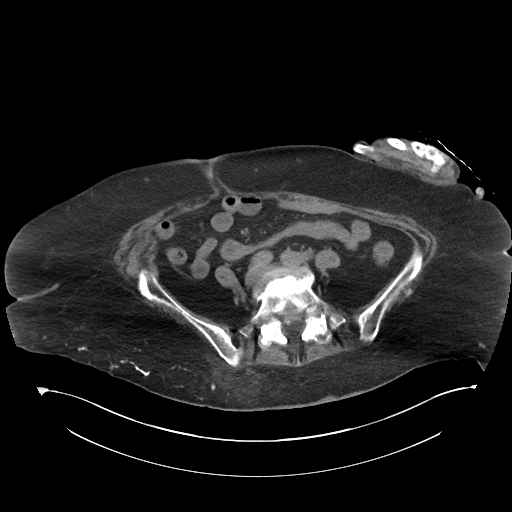
[im 47/91  soft-tissue]
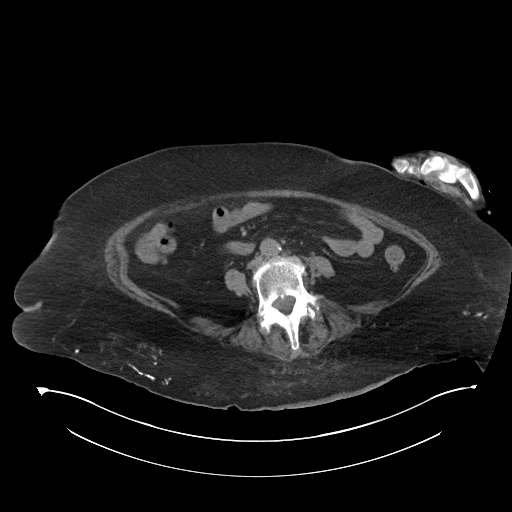
[im 54/91  soft-tissue]
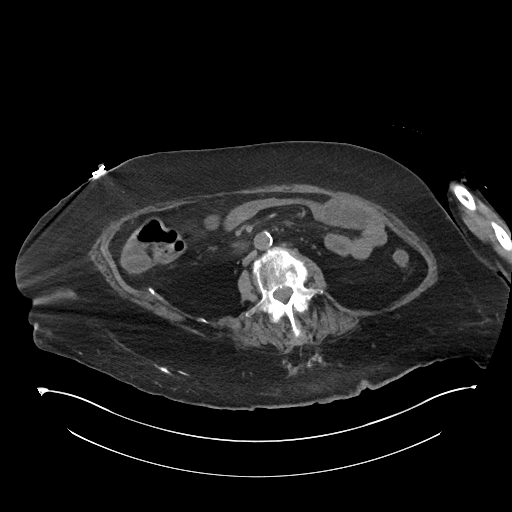
[im 61/91  soft-tissue]
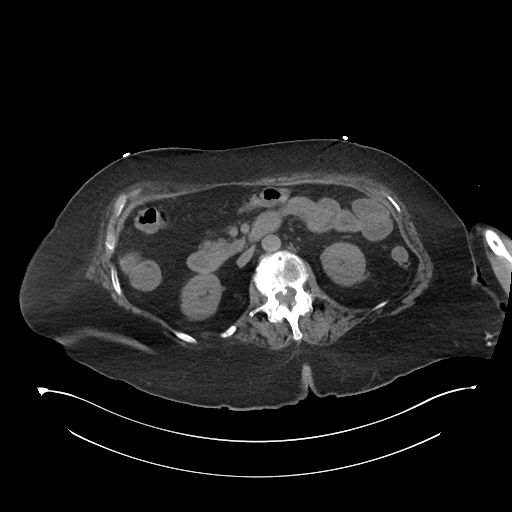
[im 61/91  bone]
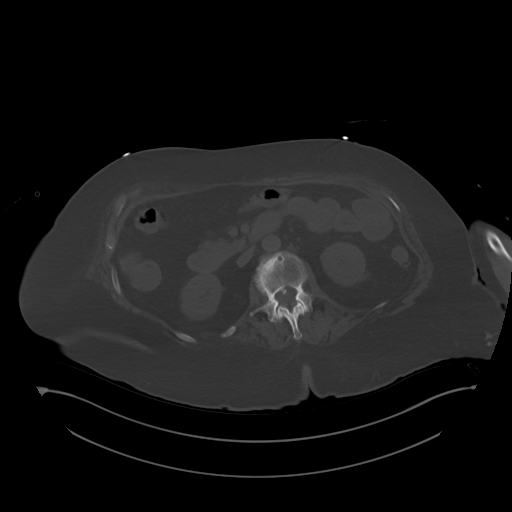
[im 67/91  soft-tissue]
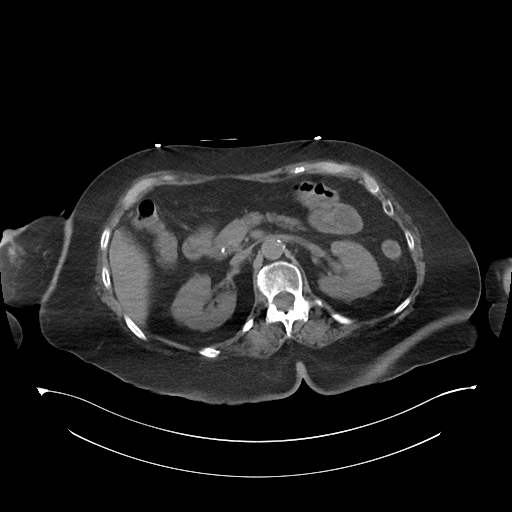
[im 74/91  soft-tissue]
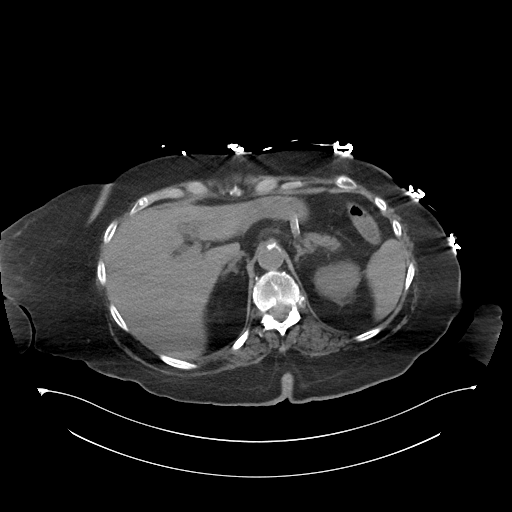
[im 81/91  soft-tissue]
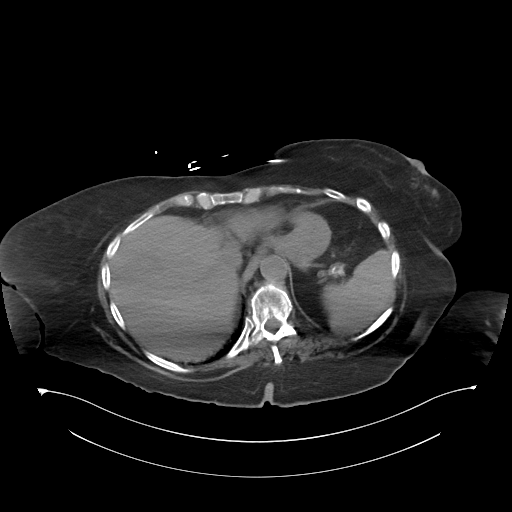
[im 87/91  soft-tissue]
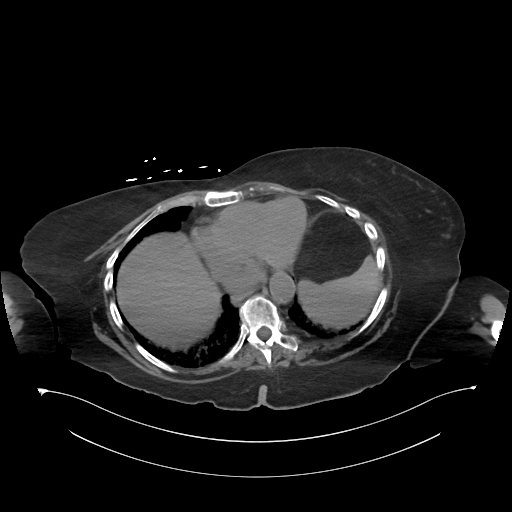

[Series 5: coronal · coronal · 0.97mm/px · 3 of 101 slices shown]
[im 34/101  soft-tissue]
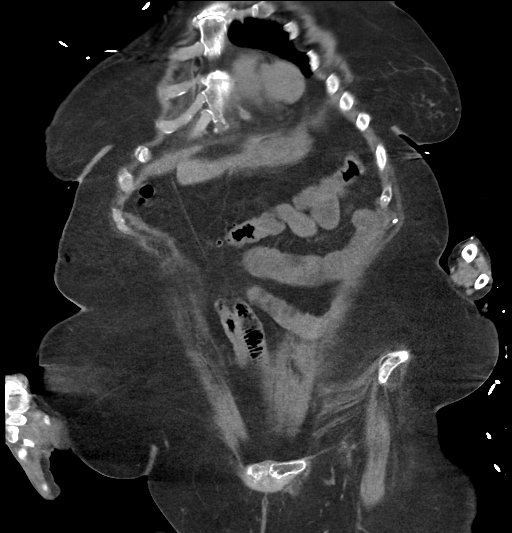
[im 45/101  soft-tissue]
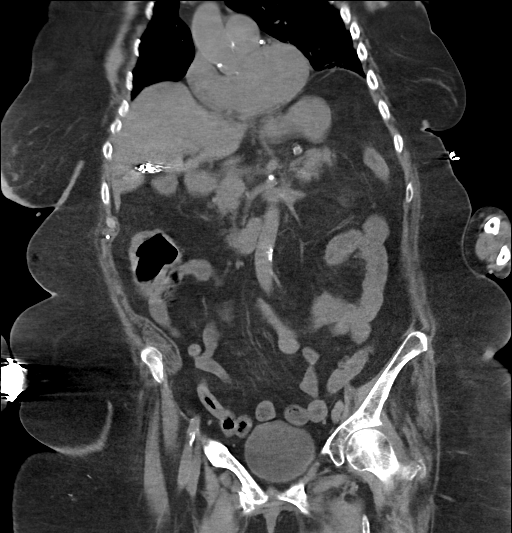
[im 56/101  soft-tissue]
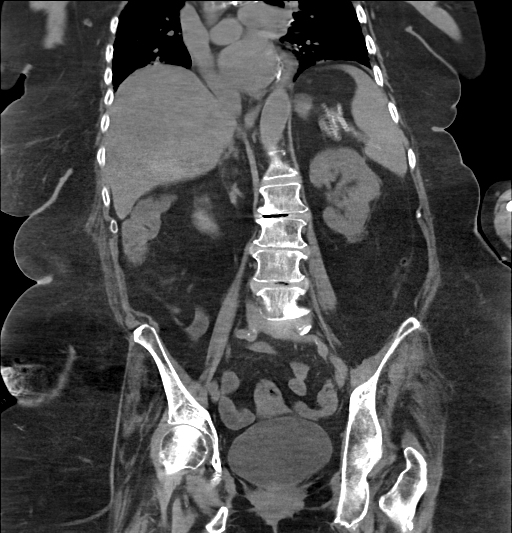

[16 of 46 positions shown; findings below may reference images not displayed]

FINDINGS: Lower chest: Bibasilar pulmonary infiltrates are present, not well
assessed on this examination. Mild coronary artery calcification.
Global cardiac size within normal limits. No pericardial effusion.

Hepatobiliary: No focal liver abnormality is seen. Status post
cholecystectomy. No biliary dilatation.

Pancreas: Unremarkable

Spleen: Unremarkable

Adrenals/Urinary Tract: Adrenal glands are unremarkable. Kidneys are
normal, without renal calculi, focal lesion, or hydronephrosis.
Bladder is unremarkable.

Stomach/Bowel: Moderate descending and sigmoid colonic
diverticulosis. The stomach, small bowel, and large bowel are
otherwise unremarkable. Appendix normal. No free intraperitoneal gas
or fluid.

Vascular/Lymphatic: Moderate aortoiliac atherosclerotic
calcification. No aortic aneurysm. No pathologic adenopathy within
the abdomen and pelvis.

Reproductive: Uterus and bilateral adnexa are unremarkable.

Other: Tiny fat containing umbilical hernia.  Rectum unremarkable.

Musculoskeletal: No acute bone abnormality. Degenerative changes are
seen within the lumbar spine. No lytic or blastic bone lesions.
IMPRESSION: Bibasilar pulmonary infiltrates, not well assessed on this
examination. While associated with volume loss, these do not have
the typical appearance of pulmonary fibrosis and additional
considerations should include interstitial lung disease, as can be
seen with connective tissue disorders or drug reaction.

No acute intra-abdominal pathology. No definite radiographic
explanation for the patient's reported symptoms.

Moderate distal colonic diverticulosis.

Aortic Atherosclerosis ([id number].T).
